# Patient Record
Sex: Male | Born: 1950
Health system: Southern US, Community
[De-identification: ages and names within clinical notes are randomized; demographics above are authoritative.]

## PROBLEM LIST (undated history)

## (undated) DIAGNOSIS — I480 Paroxysmal atrial fibrillation: Secondary | ICD-10-CM

## (undated) DIAGNOSIS — B159 Hepatitis A without hepatic coma: Secondary | ICD-10-CM

## (undated) DIAGNOSIS — K219 Gastro-esophageal reflux disease without esophagitis: Secondary | ICD-10-CM

## (undated) DIAGNOSIS — T148XXA Other injury of unspecified body region, initial encounter: Secondary | ICD-10-CM

## (undated) DIAGNOSIS — K648 Other hemorrhoids: Secondary | ICD-10-CM

## (undated) DIAGNOSIS — K2970 Gastritis, unspecified, without bleeding: Secondary | ICD-10-CM

## (undated) HISTORY — DX: Gastritis, unspecified, without bleeding: K29.70

## (undated) HISTORY — DX: Gastro-esophageal reflux disease without esophagitis: K21.9

## (undated) HISTORY — DX: Paroxysmal atrial fibrillation: I48.0

## (undated) HISTORY — DX: Other injury of unspecified body region, initial encounter: T14.8XXA

## (undated) HISTORY — DX: Other hemorrhoids: K64.8

## (undated) HISTORY — PX: HERNIA REPAIR: SHX51

## (undated) HISTORY — PX: UPPER GASTROINTESTINAL ENDOSCOPY: SHX188

## (undated) HISTORY — DX: Hepatitis a without hepatic coma: B15.9

---

## 1970-10-06 DIAGNOSIS — B159 Hepatitis A without hepatic coma: Secondary | ICD-10-CM

## 1970-10-06 HISTORY — DX: Hepatitis a without hepatic coma: B15.9

## 2001-12-17 ENCOUNTER — Encounter: Admission: RE | Admit: 2001-12-17 | Discharge: 2001-12-17 | Payer: Self-pay | Admitting: Internal Medicine

## 2001-12-17 ENCOUNTER — Encounter: Payer: Self-pay | Admitting: Internal Medicine

## 2002-05-21 ENCOUNTER — Emergency Department (HOSPITAL_COMMUNITY): Admission: EM | Admit: 2002-05-21 | Discharge: 2002-05-21 | Payer: Self-pay | Admitting: Emergency Medicine

## 2002-05-21 ENCOUNTER — Encounter: Payer: Self-pay | Admitting: Emergency Medicine

## 2002-05-23 ENCOUNTER — Encounter: Payer: Self-pay | Admitting: Internal Medicine

## 2002-05-23 ENCOUNTER — Ambulatory Visit (HOSPITAL_COMMUNITY): Admission: RE | Admit: 2002-05-23 | Discharge: 2002-05-23 | Payer: Self-pay | Admitting: Internal Medicine

## 2002-05-30 ENCOUNTER — Ambulatory Visit (HOSPITAL_COMMUNITY): Admission: RE | Admit: 2002-05-30 | Discharge: 2002-05-30 | Payer: Self-pay | Admitting: Internal Medicine

## 2002-05-30 ENCOUNTER — Encounter: Payer: Self-pay | Admitting: Internal Medicine

## 2002-06-07 ENCOUNTER — Ambulatory Visit (HOSPITAL_COMMUNITY): Admission: RE | Admit: 2002-06-07 | Discharge: 2002-06-07 | Payer: Self-pay | Admitting: Internal Medicine

## 2002-06-07 ENCOUNTER — Encounter: Payer: Self-pay | Admitting: Internal Medicine

## 2004-11-19 ENCOUNTER — Ambulatory Visit: Payer: Self-pay | Admitting: Gastroenterology

## 2005-01-30 ENCOUNTER — Ambulatory Visit: Payer: Self-pay | Admitting: Internal Medicine

## 2005-09-17 ENCOUNTER — Ambulatory Visit: Payer: Self-pay | Admitting: Internal Medicine

## 2005-09-23 ENCOUNTER — Encounter (INDEPENDENT_AMBULATORY_CARE_PROVIDER_SITE_OTHER): Payer: Self-pay | Admitting: *Deleted

## 2005-09-23 LAB — CONVERTED CEMR LAB

## 2005-10-17 ENCOUNTER — Ambulatory Visit: Payer: Self-pay | Admitting: Internal Medicine

## 2005-10-17 ENCOUNTER — Ambulatory Visit: Payer: Self-pay | Admitting: Gastroenterology

## 2005-12-10 ENCOUNTER — Ambulatory Visit: Payer: Self-pay | Admitting: Internal Medicine

## 2005-12-11 ENCOUNTER — Ambulatory Visit: Payer: Self-pay | Admitting: Internal Medicine

## 2007-01-29 ENCOUNTER — Ambulatory Visit: Payer: Self-pay | Admitting: Family Medicine

## 2007-01-29 ENCOUNTER — Encounter: Payer: Self-pay | Admitting: Internal Medicine

## 2007-04-13 ENCOUNTER — Ambulatory Visit: Payer: Self-pay | Admitting: Internal Medicine

## 2007-04-13 DIAGNOSIS — K219 Gastro-esophageal reflux disease without esophagitis: Secondary | ICD-10-CM | POA: Insufficient documentation

## 2007-04-20 ENCOUNTER — Telehealth: Payer: Self-pay | Admitting: Internal Medicine

## 2007-04-20 ENCOUNTER — Encounter (INDEPENDENT_AMBULATORY_CARE_PROVIDER_SITE_OTHER): Payer: Self-pay | Admitting: *Deleted

## 2007-04-27 ENCOUNTER — Telehealth (INDEPENDENT_AMBULATORY_CARE_PROVIDER_SITE_OTHER): Payer: Self-pay | Admitting: *Deleted

## 2007-04-30 ENCOUNTER — Encounter (INDEPENDENT_AMBULATORY_CARE_PROVIDER_SITE_OTHER): Payer: Self-pay | Admitting: *Deleted

## 2007-07-22 ENCOUNTER — Ambulatory Visit: Payer: Self-pay | Admitting: Gastroenterology

## 2007-09-07 ENCOUNTER — Telehealth (INDEPENDENT_AMBULATORY_CARE_PROVIDER_SITE_OTHER): Payer: Self-pay | Admitting: *Deleted

## 2007-09-13 ENCOUNTER — Encounter: Payer: Self-pay | Admitting: Gastroenterology

## 2007-09-13 ENCOUNTER — Encounter: Payer: Self-pay | Admitting: Internal Medicine

## 2007-09-13 ENCOUNTER — Ambulatory Visit: Payer: Self-pay | Admitting: Gastroenterology

## 2007-09-13 DIAGNOSIS — K2970 Gastritis, unspecified, without bleeding: Secondary | ICD-10-CM

## 2007-09-13 DIAGNOSIS — K648 Other hemorrhoids: Secondary | ICD-10-CM

## 2007-09-13 HISTORY — DX: Other hemorrhoids: K64.8

## 2007-09-13 HISTORY — DX: Gastritis, unspecified, without bleeding: K29.70

## 2007-09-14 ENCOUNTER — Encounter (INDEPENDENT_AMBULATORY_CARE_PROVIDER_SITE_OTHER): Payer: Self-pay | Admitting: *Deleted

## 2007-09-14 DIAGNOSIS — M8580 Other specified disorders of bone density and structure, unspecified site: Secondary | ICD-10-CM

## 2007-10-08 ENCOUNTER — Telehealth (INDEPENDENT_AMBULATORY_CARE_PROVIDER_SITE_OTHER): Payer: Self-pay | Admitting: *Deleted

## 2007-12-08 DIAGNOSIS — R1310 Dysphagia, unspecified: Secondary | ICD-10-CM | POA: Insufficient documentation

## 2008-03-22 ENCOUNTER — Ambulatory Visit: Payer: Self-pay | Admitting: Internal Medicine

## 2008-03-22 ENCOUNTER — Telehealth (INDEPENDENT_AMBULATORY_CARE_PROVIDER_SITE_OTHER): Payer: Self-pay | Admitting: *Deleted

## 2008-04-26 ENCOUNTER — Encounter: Payer: Self-pay | Admitting: Internal Medicine

## 2008-05-18 ENCOUNTER — Ambulatory Visit (HOSPITAL_BASED_OUTPATIENT_CLINIC_OR_DEPARTMENT_OTHER): Admission: RE | Admit: 2008-05-18 | Discharge: 2008-05-18 | Payer: Self-pay | Admitting: Surgery

## 2008-06-05 ENCOUNTER — Encounter: Payer: Self-pay | Admitting: Internal Medicine

## 2008-06-16 ENCOUNTER — Encounter (INDEPENDENT_AMBULATORY_CARE_PROVIDER_SITE_OTHER): Payer: Self-pay | Admitting: *Deleted

## 2008-07-13 ENCOUNTER — Encounter: Payer: Self-pay | Admitting: Internal Medicine

## 2008-07-24 ENCOUNTER — Encounter: Payer: Self-pay | Admitting: Internal Medicine

## 2008-08-07 ENCOUNTER — Encounter: Payer: Self-pay | Admitting: Internal Medicine

## 2008-08-08 ENCOUNTER — Ambulatory Visit: Payer: Self-pay | Admitting: Internal Medicine

## 2008-08-14 ENCOUNTER — Encounter (INDEPENDENT_AMBULATORY_CARE_PROVIDER_SITE_OTHER): Payer: Self-pay | Admitting: *Deleted

## 2008-08-22 ENCOUNTER — Encounter (INDEPENDENT_AMBULATORY_CARE_PROVIDER_SITE_OTHER): Payer: Self-pay | Admitting: *Deleted

## 2008-08-22 LAB — CONVERTED CEMR LAB
ALT: 20 units/L (ref 0–53)
AST: 23 units/L (ref 0–37)
Albumin: 3.9 g/dL (ref 3.5–5.2)
Alkaline Phosphatase: 52 units/L (ref 39–117)
BUN: 12 mg/dL (ref 6–23)
Basophils Absolute: 0 10*3/uL (ref 0.0–0.1)
Basophils Relative: 0.2 % (ref 0.0–3.0)
Bilirubin, Direct: 0.2 mg/dL (ref 0.0–0.3)
CO2: 29 meq/L (ref 19–32)
Calcium: 8.6 mg/dL (ref 8.4–10.5)
Chloride: 105 meq/L (ref 96–112)
Cholesterol: 143 mg/dL (ref 0–200)
Creatinine, Ser: 0.8 mg/dL (ref 0.4–1.5)
Eosinophils Absolute: 0.2 10*3/uL (ref 0.0–0.7)
Eosinophils Relative: 4.2 % (ref 0.0–5.0)
GFR calc Af Amer: 128 mL/min
GFR calc non Af Amer: 106 mL/min
Glucose, Bld: 85 mg/dL (ref 70–99)
HCT: 43.2 % (ref 39.0–52.0)
HDL: 48.8 mg/dL (ref >39.0)
Hemoglobin: 15.4 g/dL (ref 13.0–17.0)
LDL Cholesterol: 84 mg/dL (ref 0–99)
Lymphocytes Relative: 19.6 % (ref 12.0–46.0)
MCHC: 35.8 g/dL (ref 30.0–36.0)
MCV: 96.3 fL (ref 78.0–100.0)
Monocytes Absolute: 0.5 10*3/uL (ref 0.1–1.0)
Monocytes Relative: 8.3 % (ref 3.0–12.0)
Neutro Abs: 4 10*3/uL (ref 1.4–7.7)
Neutrophils Relative %: 67.7 % (ref 43.0–77.0)
PSA: 0.21 ng/mL (ref 0.10–4.00)
Platelets: 180 10*3/uL (ref 150–400)
Potassium: 4.2 meq/L (ref 3.5–5.1)
RBC: 4.48 M/uL (ref 4.22–5.81)
RDW: 12.5 % (ref 11.5–14.6)
Sodium: 139 meq/L (ref 135–145)
TSH: 1.55 microintl units/mL (ref 0.35–5.50)
Total Bilirubin: 0.8 mg/dL (ref 0.3–1.2)
Total CHOL/HDL Ratio: 2.9
Total Protein: 6.6 g/dL (ref 6.0–8.3)
Triglycerides: 50 mg/dL (ref 0–149)
VLDL: 10 mg/dL (ref 0–40)
WBC: 5.8 10*3/uL (ref 4.5–10.5)

## 2008-09-13 ENCOUNTER — Telehealth (INDEPENDENT_AMBULATORY_CARE_PROVIDER_SITE_OTHER): Payer: Self-pay | Admitting: *Deleted

## 2008-09-14 ENCOUNTER — Telehealth: Payer: Self-pay | Admitting: Internal Medicine

## 2008-10-05 ENCOUNTER — Telehealth: Payer: Self-pay | Admitting: Internal Medicine

## 2008-10-12 ENCOUNTER — Ambulatory Visit: Payer: Self-pay | Admitting: Internal Medicine

## 2008-10-24 ENCOUNTER — Encounter (INDEPENDENT_AMBULATORY_CARE_PROVIDER_SITE_OTHER): Payer: Self-pay | Admitting: *Deleted

## 2008-10-24 ENCOUNTER — Telehealth (INDEPENDENT_AMBULATORY_CARE_PROVIDER_SITE_OTHER): Payer: Self-pay | Admitting: *Deleted

## 2008-12-18 ENCOUNTER — Telehealth (INDEPENDENT_AMBULATORY_CARE_PROVIDER_SITE_OTHER): Payer: Self-pay | Admitting: *Deleted

## 2009-01-02 ENCOUNTER — Ambulatory Visit: Payer: Self-pay | Admitting: Family Medicine

## 2009-05-15 ENCOUNTER — Ambulatory Visit: Payer: Self-pay | Admitting: Internal Medicine

## 2009-05-15 DIAGNOSIS — M2550 Pain in unspecified joint: Secondary | ICD-10-CM

## 2009-05-15 DIAGNOSIS — IMO0001 Reserved for inherently not codable concepts without codable children: Secondary | ICD-10-CM | POA: Insufficient documentation

## 2009-05-16 ENCOUNTER — Ambulatory Visit: Payer: Self-pay | Admitting: Internal Medicine

## 2009-05-16 LAB — CONVERTED CEMR LAB
Basophils Absolute: 0 10*3/uL (ref 0.0–0.1)
Bilirubin Urine: NEGATIVE
HCT: 48.3 % (ref 39.0–52.0)
Hemoglobin, Urine: NEGATIVE
Ketones, ur: NEGATIVE mg/dL
Leukocytes, UA: NEGATIVE
Lymphs Abs: 0.6 10*3/uL — ABNORMAL LOW (ref 0.7–4.0)
MCHC: 34.1 g/dL (ref 30.0–36.0)
MCV: 97 fL (ref 78.0–100.0)
Monocytes Absolute: 0.7 10*3/uL (ref 0.1–1.0)
Neutro Abs: 4.7 10*3/uL (ref 1.4–7.7)
Nitrite: NEGATIVE
Platelets: 155 10*3/uL (ref 150.0–400.0)
RDW: 12.2 % (ref 11.5–14.6)
Sed Rate: 8 mm/hr (ref 0–22)

## 2009-05-17 ENCOUNTER — Encounter (INDEPENDENT_AMBULATORY_CARE_PROVIDER_SITE_OTHER): Payer: Self-pay | Admitting: *Deleted

## 2009-05-18 ENCOUNTER — Telehealth: Payer: Self-pay | Admitting: Internal Medicine

## 2009-05-21 ENCOUNTER — Encounter: Payer: Self-pay | Admitting: Internal Medicine

## 2009-05-25 ENCOUNTER — Telehealth (INDEPENDENT_AMBULATORY_CARE_PROVIDER_SITE_OTHER): Payer: Self-pay | Admitting: *Deleted

## 2009-06-06 ENCOUNTER — Telehealth (INDEPENDENT_AMBULATORY_CARE_PROVIDER_SITE_OTHER): Payer: Self-pay | Admitting: *Deleted

## 2009-07-06 ENCOUNTER — Ambulatory Visit: Payer: Self-pay | Admitting: Internal Medicine

## 2009-07-06 DIAGNOSIS — M279 Disease of jaws, unspecified: Secondary | ICD-10-CM | POA: Insufficient documentation

## 2009-07-18 ENCOUNTER — Encounter: Payer: Self-pay | Admitting: Internal Medicine

## 2009-10-06 HISTORY — PX: COLONOSCOPY: SHX174

## 2009-10-06 HISTORY — PX: SKIN CANCER EXCISION: SHX779

## 2010-01-31 ENCOUNTER — Encounter: Payer: Self-pay | Admitting: Internal Medicine

## 2010-06-07 ENCOUNTER — Ambulatory Visit: Payer: Self-pay | Admitting: Internal Medicine

## 2010-06-14 ENCOUNTER — Ambulatory Visit: Payer: Self-pay | Admitting: Internal Medicine

## 2010-08-27 ENCOUNTER — Telehealth: Payer: Self-pay | Admitting: Internal Medicine

## 2010-08-28 ENCOUNTER — Encounter (INDEPENDENT_AMBULATORY_CARE_PROVIDER_SITE_OTHER): Payer: Self-pay | Admitting: *Deleted

## 2010-09-03 ENCOUNTER — Ambulatory Visit: Payer: Self-pay | Admitting: Gastroenterology

## 2010-09-03 DIAGNOSIS — K602 Anal fissure, unspecified: Secondary | ICD-10-CM

## 2010-10-22 ENCOUNTER — Ambulatory Visit: Admit: 2010-10-22 | Payer: Self-pay | Admitting: Gastroenterology

## 2010-11-03 LAB — CONVERTED CEMR LAB
ALT: 22 units/L (ref 0–53)
ALT: 24 units/L (ref 0–53)
AST: 27 units/L (ref 0–37)
AST: 28 units/L (ref 0–37)
Albumin: 3.9 g/dL (ref 3.5–5.2)
Alkaline Phosphatase: 47 units/L (ref 39–117)
Alkaline Phosphatase: 52 units/L (ref 39–117)
BUN: 13 mg/dL (ref 6–23)
BUN: 16 mg/dL (ref 6–23)
Basophils Absolute: 0 10*3/uL (ref 0.0–0.1)
Basophils Relative: 0.3 % (ref 0.0–1.0)
Basophils Relative: 0.4 % (ref 0.0–3.0)
Bilirubin, Direct: 0.1 mg/dL (ref 0.0–0.3)
Bilirubin, Direct: 0.2 mg/dL (ref 0.0–0.3)
CO2: 30 meq/L (ref 19–32)
Calcium: 9.1 mg/dL (ref 8.4–10.5)
Chloride: 107 meq/L (ref 96–112)
Chloride: 110 meq/L (ref 96–112)
Cholesterol: 134 mg/dL (ref 0–200)
Cholesterol: 142 mg/dL (ref 0–200)
Creatinine, Ser: 0.9 mg/dL (ref 0.4–1.5)
Creatinine, Ser: 0.9 mg/dL (ref 0.4–1.5)
Eosinophils Absolute: 0.3 10*3/uL (ref 0.0–0.6)
Eosinophils Relative: 5.6 % — ABNORMAL HIGH (ref 0.0–5.0)
Eosinophils Relative: 7.3 % — ABNORMAL HIGH (ref 0.0–5.0)
GFR calc Af Amer: 112 mL/min
GFR calc non Af Amer: 89.09 mL/min (ref >60)
GFR calc non Af Amer: 92 mL/min
Glucose, Bld: 75 mg/dL (ref 70–99)
Glucose, Bld: 87 mg/dL (ref 70–99)
HCT: 44.4 % (ref 39.0–52.0)
HDL: 55.8 mg/dL (ref >39.0)
Hemoglobin: 15.3 g/dL (ref 13.0–17.0)
LDL Cholesterol: 79 mg/dL (ref 0–99)
LDL Cholesterol: 79 mg/dL (ref 0–99)
Lymphocytes Relative: 21.4 % (ref 12.0–46.0)
MCHC: 34.5 g/dL (ref 30.0–36.0)
MCV: 97.2 fL (ref 78.0–100.0)
MCV: 98.2 fL (ref 78.0–100.0)
Monocytes Absolute: 0.5 10*3/uL (ref 0.1–1.0)
Monocytes Absolute: 0.6 10*3/uL (ref 0.2–0.7)
Monocytes Relative: 10 % (ref 3.0–12.0)
Monocytes Relative: 10.3 % (ref 3.0–11.0)
Neutro Abs: 3.9 10*3/uL (ref 1.4–7.7)
Neutrophils Relative %: 62.4 % (ref 43.0–77.0)
Neutrophils Relative %: 64 % (ref 43.0–77.0)
PSA: 0.23 ng/mL (ref 0.10–4.00)
Platelets: 174 10*3/uL (ref 150.0–400.0)
Platelets: 199 10*3/uL (ref 150–400)
Potassium: 4.5 meq/L (ref 3.5–5.1)
RBC: 4.43 M/uL (ref 4.22–5.81)
RBC: 4.56 M/uL (ref 4.22–5.81)
RDW: 12.2 % (ref 11.5–14.6)
Sodium: 144 meq/L (ref 135–145)
TSH: 1.27 microintl units/mL (ref 0.35–5.50)
TSH: 1.96 microintl units/mL (ref 0.35–5.50)
Total Bilirubin: 0.8 mg/dL (ref 0.3–1.2)
Total Bilirubin: 1.2 mg/dL (ref 0.3–1.2)
Total CHOL/HDL Ratio: 2.5
Total Protein: 5.9 g/dL — ABNORMAL LOW (ref 6.0–8.3)
Total Protein: 6.5 g/dL (ref 6.0–8.3)
Triglycerides: 36 mg/dL (ref 0–149)
VLDL: 7 mg/dL (ref 0–40)
Vit D, 25-Hydroxy: 62 ng/mL (ref 30–89)
WBC: 5.3 10*3/uL (ref 4.5–10.5)
WBC: 6.1 10*3/uL (ref 4.5–10.5)

## 2010-11-05 NOTE — Miscellaneous (Signed)
Summary: BONE DENSITY  Clinical Lists Changes  Orders: Added new Test order of T-Bone Densitometry (77080) - Signed Added new Test order of T-Lumbar Vertebral Assessment (77082) - Signed 

## 2010-11-05 NOTE — Assessment & Plan Note (Signed)
Summary: CPX/REFERRAL FOR HIS HEMR/PH   Vital Signs:  Patient profile:   60 year old male Height:      72 inches Weight:      164.4 pounds BMI:     22.38 Pulse rate:   76 / minute Resp:     12 per minute BP sitting:   110 / 68   History of Present Illness: Michael Lindsey is here for a physical; he is asymptomatic .   Allergies: No Known Drug Allergies  Past History:  Past Medical History: Hepatitis A in [facility] @ [place] 1972 GERD Osteoporosis: T score -2.9 @ spine in 2007;T score 1 1.8 in 2008.Bisphosphonate therapy initiated 2007 but interupted 07/2009  to  present due to oral surgery & CTX score by Dr Bradly Chris  Past Surgical History: PTX,fractured  ribs(2nd,3rd & 4th) 2003;fractured  LUE  age 52;Colonoscopy negative X2; Upper Endoscopy 2003 :esophagitis Hepatitis A in college Inguinal herniorrhaphy 2009,Dr Wenda Low  Family History: Father: CAD, colon cancer, emphysema Mother:Mental Health issue, ?Tb, S/P iatrogenic  PTX Siblings: bro :depression; P uncle: alcoholism  Social History: Occupation:  Secondary school teacher Married Alcohol use-yes: socially Regular exercise-yes: 60 min daily as swimming , biking, walking, weights  Review of Systems  The patient denies anorexia, fever, weight loss, weight gain, vision loss, decreased hearing, hoarseness, chest pain, syncope, dyspnea on exertion, peripheral edema, prolonged cough, headaches, hemoptysis, abdominal pain, melena, hematochezia, severe indigestion/heartburn, hematuria, suspicious skin lesions, depression, unusual weight change, abnormal bleeding, enlarged lymph nodes, and angioedema.         Recent fungal dermatitis treated with Diflucan & topical agent.  Physical Exam  General:  Thin but well-nourished; alert,appropriate and cooperative throughout examination Head:  Normocephalic and atraumatic without obvious abnormalities.  Eyes:  No corneal or conjunctival inflammation noted. Perrla. Funduscopic exam  benign, without hemorrhages, exudates or papilledema. Ears:  External ear exam shows no significant lesions or deformities.  Otoscopic examination reveals clear canals, tympanic membranes are intact bilaterally without bulging, retraction, inflammation or discharge. Hearing is grossly normal bilaterally. Nose:  External nasal examination shows no deformity or inflammation. Nasal mucosa are pink and moist without lesions or exudates. Mouth:  Oral mucosa and oropharynx without lesions or exudates.  Teeth in good repair. Neck:  No deformities, masses, or tenderness noted. Lungs:  Normal respiratory effort, chest expands symmetrically. Lungs are clear to auscultation, no crackles or wheezes. Heart:  regular rhythm, no murmur, no gallop, no rub, no JVD, no HJR, and bradycardia.   Abdomen:  Bowel sounds positive,abdomen soft and non-tender without masses, organomegaly or hernias noted. Rectal:  No external abnormalities noted. Normal sphincter tone. No rectal masses or tenderness. Genitalia:  Testes bilaterally descended without nodularity, tenderness or masses. No scrotal masses or lesions. No penis lesions or urethral discharge. L varicocele.   Weakness L inguinal area w/o true hernia Prostate:  Prostate gland firm and smooth, no enlargement, nodularity, tenderness, mass, asymmetry or induration. Msk:  No deformity or scoliosis noted of thoracic or lumbar spine.   Pulses:  R and L carotid,radial,dorsalis pedis and posterior tibial pulses are full and equal bilaterally Extremities:  No clubbing, cyanosis, edema, or deformity noted with normal full range of motion of all joints.   Neurologic:  alert & oriented X3 and DTRs symmetrical and normal.   Skin:  Intact without suspicious lesions or rashes Cervical Nodes:  No lymphadenopathy noted Axillary Nodes:  No palpable lymphadenopathy Inguinal Nodes:  No significant adenopathy Psych:  memory intact for recent and remote,  normally interactive, and good eye  contact.     Impression & Recommendations:  Problem # 1:  ROUTINE GENERAL MEDICAL EXAM@HEALTH  CARE FACL (ICD-V70.0)  Orders: EKG w/ Interpretation (93000) Radiology Referral (Radiology)  Problem # 2:  OSTEOPOROSIS (ICD-733.00) improved to Osteopenia  based on 2008 BMD His updated medication list for this problem includes:    Boniva 150 Mg Tabs (Ibandronate sodium) .Marland Kitchen... 1 every month  Problem # 3:  GERD (ICD-530.81) Quiescent/ controlled His updated medication list for this problem includes:    Zantac 150 Mg Tabs (Ranitidine hcl) .Marland Kitchen... 1 by mouth once daily as needed   Complete Medication List: 1)  Multivitamin  2)  Flax Seed Oil  3)  Caltrate With Vit D  4)  2 Tbsp Apple Cider Vinegar  5)  2 Tbsp Honey  6)  Boniva 150 Mg Tabs (Ibandronate sodium) .Marland Kitchen.. 1 every month 7)  Zantac 150 Mg Tabs (Ranitidine hcl) .Marland Kitchen.. 1 by mouth once daily 8)  Vitamin D 1000 Unit Tabs (Cholecalciferol) .Marland Kitchen.. 1 by mouth once daily 9)  Echinacea 400 Mg Caps (Echinacea) .... As needed cold symptoms 10)  Tramadol Hcl 50 Mg Tabs (Tramadol hcl) .Marland Kitchen.. 1 q 6 hrs as needed for pain  Patient Instructions: 1)  Please schedule fasting labs same day as BMD @ [facility]:CTX; vitamin D level;BMP;Hepatic Panel ;Lipid Panel ;TSH ;CBC w/ Diff;PSA .Codes: V70.0, 733.90, 530.81. 2)  Avoid foods high in acid (tomatoes, citrus juices, spicy foods). Avoid eating within two hours of lying down or before exercising. Do not over eat; try smaller more frequent meals. Elevate head of bed twelve inches when sleeping.

## 2010-11-05 NOTE — Assessment & Plan Note (Signed)
Summary: hemorroids w/other complications--ch.    History of Present Illness Visit Type: 2 Primary GI MD: Sheryn Bison MD FACP FAGA Primary [place]: Marga Melnick, MD Requesting Servando Kyllonen: Marga Melnick, MD Chief Complaint: First week of October pt started having rectal pain and swollen hemorrhoids. Pt states it all started with a bike ride. Pt has been using preperation H with some improvement.  History of Present Illness:   60 year old Caucasian male with 2 months of rectal discomfort worsened with BM.He also has had very rectal rash which has responded to Dedonide ointment after dermatology referral. He denies rectal bleeding, abdominal pain, or upper GI symptoms.  His family history is positive for colon cancer in his father. He is due for followup exam in December 2013. His chronic GERD is made with a home remedy of honey and vinerar.He denies dysphagia, anorexia, weight loss, or NSAID use. He does have chronic osteoporosis but previous celiac serologies were negative.   GI Review of Systems      Denies abdominal pain, acid reflux, belching, bloating, chest pain, dysphagia with liquids, dysphagia with solids, heartburn, loss of appetite, nausea, vomiting, vomiting blood, weight loss, and  weight gain.      Reports hemorrhoids and  rectal pain.     Denies anal fissure, black tarry stools, change in bowel habit, constipation, diarrhea, diverticulosis, fecal incontinence, heme positive stool, irritable bowel syndrome, jaundice, light color stool, liver problems, and  rectal bleeding.    Current Medications (verified): 1)  Multivitamin 2)  Flax Seed Oil 3)  Caltrate With Vit D 4)  2 Tbsp Apple Cider Vinegar 5)  2 Tbsp Honey 6)  Zantac 150 Mg Tabs (Ranitidine Hcl) .Marland Kitchen.. 1 By Mouth Once A Week 7)  Vitamin D 1000 Unit Tabs (Cholecalciferol) .Marland Kitchen.. 1 By Mouth Once Daily 8)  Echinacea 400 Mg Caps (Echinacea) .... As Needed Cold Symptoms 9)  Preparation H 0.25-3-14-71.9 % Oint  (Phenyleph-Shark Liv Oil-Mo-Pet) .... Use As Needed  Allergies (verified): No Known Drug Allergies  Past History:  Past medical, surgical, family and social histories (including risk factors) reviewed for relevance to current acute and chronic problems.  Past Medical History: Reviewed history from 06/07/2010 and no changes required. [facility]atitis A in [facility] @ [facility] 1972 GERD Osteoporosis: T score -2.9 @ spine in 2007;T score 1 1.8 in 2008.Bisphosphonate therapy initiated 2007 but interupted 07/2009  to  present due to oral surgery & CTX score by Dr Bradly Chris  Past Surgical History: PTX,fractured  ribs(2nd,3rd & 4th) 2003;fractured  LUE  age 38;Colonoscopy negative X2; Upper Endoscopy 2003 :esophagitis Hepatitis A in college Inguinal herniorrhaphy 2009,Dr Wenda Low Tooth extraction 11/2009  Family History: Reviewed history from 06/07/2010 and no changes required. Father: CAD, colon cancer, emphysema Mother:Mental Health issue, ?Tb, S/P iatrogenic  PTX Siblings: bro :depression; P uncle: alcoholism  Social History: Reviewed history from 06/07/2010 and no changes required. Occupation:  Secondary school teacher Married Alcohol use-yes: socially Regular exercise-yes: 60 min daily as swimming , biking, walking, weights  Review of Systems       The patient complains of itching and skin rash.  The patient denies allergy/sinus, anemia, anxiety-new, arthritis/joint pain, back pain, blood in urine, breast changes/lumps, change in vision, confusion, cough, coughing up blood, depression-new, fainting, fatigue, fever, headaches-new, hearing problems, heart murmur, heart rhythm changes, menstrual pain, muscle pains/cramps, night sweats, nosebleeds, pregnancy symptoms, shortness of breath, sleeping problems, sore throat, swelling of feet/legs, swollen lymph glands, thirst - excessive , urination - excessive , urination changes/pain, urine leakage, vision  changes, and voice change.    Vital  Signs:  Patient profile:   60 year old male Height:      72 inches Weight:      170.13 pounds BMI:     23.16 Pulse rate:   80 / minute Pulse rhythm:   regular BP sitting:   128 / 72  (left arm) Cuff size:   regular  Vitals Entered By: Christie Nottingham CMA Duncan Dull) (September 03, 2010 1:38 PM)  Physical Exam  General:  Well developed, well nourished, no acute distress.healthy appearing.   Head:  Normocephalic and atraumatic. Eyes:  PERRLA, no icterus.exam deferred to patient's ophthalmologist.   Abdomen:  Soft, nontender and nondistended. No masses, hepatosplenomegaly or hernias noted. Normal bowel sounds. Rectal:  Normal exam.Superficial posterior fissure noted. Rectal exam otherwise is unremarkable with guaiac-negative stool. Psych:  Alert and cooperative. Normal mood and affect.   Impression & Recommendations:  Problem # 1:  GERD (ICD-530.81) Assessment Comment Only  Problem # 2:  NEOPLASM, MALIGNANT, COLON, FAMILY HX, FATHER (ICD-V16.0) Assessment: Unchanged Colonoscopy followup as per clinical protocol.  Problem # 3:  ANAL FISSURE (ICD-565.0) Assessment: Deteriorated Diltiazem gel locally several times a day with p.r.n. Xylocaine ointment. I fiber diet and daily Benefiber suggested. If he does not improve in 7-10 days we will consider flexible sigmoidoscopy exam and anoscopy with sedation.  Patient Instructions: 1)  Copy sent to : Marga Melnick, MD 2)  Your prescriptions have been [facility] to [facility].  3)  Use Benefiber daily 4)  The medication list was reviewed and reconciled.  All changed / newly prescribed medications were explained.  A complete medication list was provided to the patient / caregiver. 5)  High Fiber, Low Fat  Healthy Eating Plan brochure given.  Prescriptions: XYLOCAINE JELLY 2 % GEL (LIDOCAINE HCL) use per rectum as needed  #1 tube x 1   Entered by:   Harlow Mares CMA (AAMA)   Authorized by:   Mardella Layman MD [facility]   Signed by:   Harlow Mares CMA (AAMA) on 09/03/2010   Method used:   Faxed to ...       [facility]* (retail)       [address]       [place], [place]  [id number]       Ph: [phone]       Fax: [phone]   RxID:   [id number] DILTIAZEM GEL 2% apply small amount to rectum Four times a day  #15 grams x 1   Entered by:   Harlow Mares CMA (AAMA)   Authorized by:   Mardella Layman MD [facility]   Signed by:   Harlow Mares CMA (AAMA) on 09/03/2010   Method used:   Faxed to ...       [facility]* (retail)       [address]       [place], [place]  [id number]       Ph: [phone]       Fax: [phone]   RxID:   [id number]

## 2010-11-05 NOTE — Miscellaneous (Signed)
Summary: Health Care Power of [facility] Power of Attorney   Imported By: Lanelle Bal 03/29/2010 14:11:19  _____________________________________________________________________  External Attachment:    Type:   Image     Comment:   External Document

## 2010-11-05 NOTE — Progress Notes (Signed)
Summary: Referral  Phone Note Call from Patient Call back at Work Phone [phone]   Summary of Call: Patient called for referral due to hemorrhoids. Please advise. Initial call taken by: Lucious Groves CMA,  August 27, 2010 3:40 PM  Follow-up for Phone Call        Per MD will refer to GI. Lucious Groves CMA  August 27, 2010 5:03 PM   Left message on voicemail to call back to office. Lucious Groves CMA  August 28, 2010 8:13 AM   Patient notified. Lucious Groves CMA  August 28, 2010 8:46 AM

## 2010-11-05 NOTE — Letter (Signed)
Summary: New Patient letter  [facility]  [address] [place], [place] [address]   Phone: [phone]  Fax: [phone]       08/28/2010 MRN: [id number]  ERNAN Lindsey [address] [place], [place]  [address]  Dear Michael Lindsey,  Welcome to the Gastroenterology Division at [facility].    You are scheduled to see Dr.  Jarold Motto on 10-10-10 at 8:30a.m. on the 3rd floor at [facility], [address]  We ask that you try to arrive at our office 15 minutes prior to your appointment time to allow for check-in.  We would like you to complete the enclosed self-administered evaluation form prior to your visit and bring it with you on the day of your appointment.  We will review it with you.  Also, please bring a complete list of all your medications or, if you prefer, bring the medication bottles and we will list them.  Please bring your insurance card so that we may make a copy of it.  If your insurance requires a referral to see a specialist, please bring your referral form from your primary care physician.  Co-payments are due at the time of your visit and may be paid by cash, check or credit card.     Your office visit will consist of a consult with your physician (includes a physical exam), any laboratory testing he/she may order, scheduling of any necessary diagnostic testing (e.g. x-ray, ultrasound, CT-scan), and scheduling of a procedure (e.g. Endoscopy, Colonoscopy) if required.  Please allow enough time on your schedule to allow for any/all of these possibilities.    If you cannot keep your appointment, please call [phone] to cancel or reschedule prior to your appointment date.  This allows [place] the opportunity to schedule an appointment for another patient in need of care.  If you do not cancel or reschedule by 5 p.m. the business day prior to your appointment date, you will be charged a $50.00 late cancellation/no-show fee.    Thank you for  choosing [facility] for your medical needs.  We appreciate the opportunity to care for you.  Please visit [place] at our website  to learn more about our practice.                     Sincerely,                                                             The Gastroenterology Division

## 2010-11-05 NOTE — Miscellaneous (Signed)
Summary: Living Will  Living Will   Imported By: Lanelle Bal 03/29/2010 14:09:27  _____________________________________________________________________  External Attachment:    Type:   Image     Comment:   External Document

## 2011-02-18 NOTE — Assessment & Plan Note (Signed)
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   The patient does have documented acid reflux and hiatal hernia per  endoscopic exam done in July 2003.  He developed osteoporosis which was  felt secondary to chronic PPI therapy and has self discontinued this  medication and uses a homemade mixture of some type of vinegar solution  for his acid reflux symptoms.  He recently has had intermittent solid  food dysphagia.  He has seen Dr. Alwyn Lindsey, who has recommended endoscopy.  He denies typical acid reflux symptoms at this time or any hepatobiliary  complaints.  He also has fairly regular bowel movements, but he does  have a past history of colon polyps removed by colonoscopy.  This was  last performed approximately 5 years ago.  He denies lower GI problems  at this time.   RECENT LABORATORY DATA:  Per Dr. Alwyn Lindsey, showed a normal CBC, metabolic  profile, and liver function tests.   Michael Lindsey uses Librax on a p.r.n. basis for his bowel complaints along with  a variety of multivitamins, Caltrate with vitamin D, and a mixture of an  apple cider vinegar and honey preparation for his GERD.  He has had no  other symptoms of malabsorption and denies skin rashes, joint pains,  oral stomatitis, etc.  His family is of [place] and [place]  extraction.  There is no known family history of celiac disease or other  gastrointestinal problems, although his father died of colon cancer at  the age of 81.   PHYSICAL EXAMINATION:  GENERAL:  He is a healthy-appearing white male in  no distress.  He appears his stated age.  VITAL SIGNS:  He weighs 164 pounds.  Blood pressure is  108/68, pulse was  72 and regular.  A general physical exam was not performed at this time.   ASSESSMENT:  1. Probable continued gastroesophageal reflux disease.  Rule out      peptic stricture of the esophagus versus eosinophilic esophagitis.  2. History of recurrent colon polyps and a family history of colon      cancer.  The patient is due now for followup colon examination.  3. Rule out celiac disease with his osteoporosis problem.   RECOMMENDATIONS:  1. Librax p.r.n. as needed for his IBS complaints.  2. Outpatient endoscopy with esophageal biopsies.  3. Followup colonoscopy exam.  4. Advised p.r.n. H2 blocker use in the interim as needed for his acid      reflux.     Michael Rea. Jarold Motto, MD, Michael Lindsey, FAGA  Electronically Signed    DRP/MedQ  DD: 07/22/2007  DT: 07/22/2007  Job #: [id number]   cc:   Michael Lindsey. Michael Ren, MD,FACP,FCCP

## 2011-02-18 NOTE — Op Note (Signed)
NAMEDWON, SKY              ACCOUNT NO.:  [id number]   MEDICAL RECORD NO.:  [id number]          PATIENT TYPE:  AMB   LOCATION:  DSC                          FACILITY:  [facility]   PHYSICIAN:  Thornton Park. Daphine Deutscher, MD  DATE OF BIRTH:  Dec 12, 1950   DATE OF PROCEDURE:  05/18/2008  DATE OF DISCHARGE:                               OPERATIVE REPORT   PREOPERATIVE DIAGNOSIS:  Left inguinal hernia.   POSTOPERATIVE DIAGNOSIS:  Left direct inguinal hernia.   PROCEDURE:  Left inguinal herniorrhaphy with Ethicon Ultra II mesh.   SURGEON:  Thornton Park. Daphine Deutscher, MD   ANESTHESIA:  General by LMA.   DESCRIPTION OF PROCEDURE:  Michael Lindsey is a 60 year old white male  with a left inguinal hernia.  This has been increasing in size.  The  region was marked preoperatively and he was taken back to OR #3 [facility] where the area was clipped and then prepped widely with [facility]  Care and draped sterilely.  The small oblique incision was described  with a marker.  This was made and carried down through Scarpa's to the  external oblique.  Bleeding was controlled with 4-0 Vicryl ties and the  electrocautery.  The area was injected with 1% lidocaine plain  throughout the case and before beginning the dissection.  I incised the  external oblique fibers down to the ring and then mobilized the cord.  Obvious to me was a very large hernia, which my initial suspicion was  that it was an indirect hernia, but upon mobilizing this big sac, found  that it was medial to the epigastric vessels and that involved pretty  much the entire floor.  This was freed up in its entirety and delineated  the anatomy.  A Penrose was around the cord structures.  I did check the  cord structures proximally and saw no evidence of an indirect inguinal  hernia.  I went ahead and imbricated the large direct hernia to tuck it  back inside and then proceeded to repair the floor with a piece of  UltraPro mesh suturing it medially  with a 2-0 Prolene along the inguinal  ligament and medially to the internal obliques cutting and bring it  around the cord structures and then suturing it to itself with two  horizontal mattress sutures of 2-0 Prolene.  Ilioinguinal nerve branches  were spared, although they were somewhat splayed by the size of this  hernia and were retracted with the superior flap.  Again, I injected  with 1% lidocaine.  Hemostasis was present.  The external oblique was  then closed with running 2-0 Vicryl and then the subcutaneous tissue was  closed with 4-0 Vicryl including the Scarpa fascia and then  approximation of the skin occurred with 5-0 Vicryl in a running  subcuticular fashion with Benzoin and Steri-Strips on the skin.  The  patient seemed to tolerate this procedure well.  He will be given  Percocet 5/500 to take for pain and will be followed up in the office in  two weeks.     Thornton Park Daphine Deutscher, MD  Electronically  Signed    MBM/MEDQ  D:  05/18/2008  T:  05/18/2008  Job:  [id number]   cc:   Titus Dubin. Alwyn Ren, MD,FACP,FCCP

## 2011-07-04 LAB — POCT HEMOGLOBIN-HEMACUE: Hemoglobin: 15.3

## 2011-07-10 ENCOUNTER — Encounter: Payer: Self-pay | Admitting: Internal Medicine

## 2011-07-10 ENCOUNTER — Ambulatory Visit (INDEPENDENT_AMBULATORY_CARE_PROVIDER_SITE_OTHER): Payer: BC Managed Care – PPO | Admitting: Internal Medicine

## 2011-07-10 DIAGNOSIS — Z136 Encounter for screening for cardiovascular disorders: Secondary | ICD-10-CM

## 2011-07-10 DIAGNOSIS — Z0000 Encounter for general adult medical examination without abnormal findings: Secondary | ICD-10-CM

## 2011-07-10 DIAGNOSIS — R1310 Dysphagia, unspecified: Secondary | ICD-10-CM

## 2011-07-10 DIAGNOSIS — M810 Age-related osteoporosis without current pathological fracture: Secondary | ICD-10-CM

## 2011-07-10 DIAGNOSIS — Z23 Encounter for immunization: Secondary | ICD-10-CM

## 2011-07-10 MED ORDER — ZOSTER VACCINE LIVE 19400 UNT/0.65ML ~~LOC~~ SOLR: 0.6500 mL | Freq: Once | SUBCUTANEOUS | Status: DC

## 2011-07-10 NOTE — Progress Notes (Signed)
Subjective:    Patient ID: Michael Lindsey, male    DOB: 27-Sep-1951, 60 y.o.   MRN: [id number]  HPI  Michael Lindsey  is here for a physical; he has acute issues.      Review of Systems Patient reports no  vision/ hearing changes,anorexia, weight change, fever ,adenopathy, persistant / recurrent hoarseness, swallowing issues, chest pain,palpitations, edema,persistant / recurrent cough, hemoptysis, dyspnea(rest, exertional, paroxysmal nocturnal), gastrointestinal  bleeding (melena, rectal bleeding), abdominal pain, excessive heart burn, GU symptoms( dysuria, hematuria, pyuria, voiding/incontinence  issues) syncope, focal weakness, memory loss,numbness & tingling, skin/hair/nail changes,depression, anxiety, abnormal bruising/bleeding, musculoskeletal symptoms/signs.      Objective:   Physical Exam Gen.: Thin but healthy and well-nourished in appearance. Alert, appropriate and cooperative throughout exam. Head: Normocephalic without obvious abnormalities  Eyes: No corneal or conjunctival inflammation noted. Pupils equal round reactive to light and accommodation. Fundal exam is benign without hemorrhages, exudate, papilledema. Extraocular motion intact. Vision grossly normal. Ears: External  ear exam reveals no significant lesions or deformities. Canals clear .TMs normal. Hearing is grossly normal bilaterally. Nose: External nasal exam reveals no deformity or inflammation. Nasal mucosa are pink and moist. No lesions or exudates noted.  Mouth: Oral mucosa and oropharynx reveal no lesions or exudates. Teeth in good repair. Neck: No deformities, masses, or tenderness noted. Range of motion slightly decreased. Thyroid normal. Lungs: Normal respiratory effort; chest expands symmetrically. Lungs are clear to auscultation without rales, wheezes, or increased work of breathing. Heart: Slow rate ;regular rhythm. Normal S1 and S2. No gallop, click, or rub. No  murmur. Abdomen: Bowel sounds normal; abdomen soft  and nontender. No masses, organomegaly or hernias noted. Genitalia/DRE: Genital exam is unremarkable; right prostate is upper limits of normal in size without induration or nodularity. Left lobe is small and soft . Note:His PSA was 0.18 in 2006. There is no family history of prostate disease.  .                                                                                   Musculoskeletal/extremities: No deformity or scoliosis noted of  the thoracic or lumbar spine but slight asymmetry of thoracic muscles.. No clubbing, cyanosis, edema, or deformity noted. Range of motion of extremities normal .Tone & strength  normal.Joints normal. Nail health  Good.Minimal pectus excavatum Vascular: Carotid, radial artery, dorsalis pedis and  posterior tibial pulses are full and equal. No bruits present. Neurologic: Alert and oriented x3. Deep tendon reflexes symmetrical and normal.          Skin: Intact without suspicious lesions or rashes. Lymph: No cervical, axillary, or inguinal lymphadenopathy present. Psych: Mood and affect are normal. Normally interactive  Assessment & Plan:  #1 comprehensive physical exam; no acute findings #2 see Problem List with Assessments & Recommendations Plan: see Orders

## 2011-07-10 NOTE — Patient Instructions (Signed)
Preventive Health Care: Exercise at least 30-45 minutes a day,  3-4 days a week.  Eat a low-fat diet with lots of fruits and vegetables, up to 7-9 servings per day. Please  schedule fasting Labs : BMET,Lipids, hepatic panel, CBC & dif, TSH, vit D level (V70.0).  Please bring these instructions to that Lab appt. Marland Kitchen

## 2011-07-14 ENCOUNTER — Telehealth: Payer: Self-pay | Admitting: Gastroenterology

## 2011-07-14 DIAGNOSIS — Z0000 Encounter for general adult medical examination without abnormal findings: Secondary | ICD-10-CM

## 2011-07-14 NOTE — Telephone Encounter (Signed)
Pt requests our lab drawn blood for his upcoming physical since our office is close. Per Dr Frederik Pear ofc note on 07/10/11 and the Code V70.0, labs entered.

## 2011-07-25 ENCOUNTER — Other Ambulatory Visit (INDEPENDENT_AMBULATORY_CARE_PROVIDER_SITE_OTHER): Payer: BC Managed Care – PPO

## 2011-07-25 DIAGNOSIS — Z0000 Encounter for general adult medical examination without abnormal findings: Secondary | ICD-10-CM

## 2011-07-25 LAB — HEPATIC FUNCTION PANEL
ALT: 19 U/L (ref 0–53)
AST: 25 U/L (ref 0–37)
Albumin: 3.7 g/dL (ref 3.5–5.2)
Total Bilirubin: 0.9 mg/dL (ref 0.3–1.2)
Total Protein: 6.3 g/dL (ref 6.0–8.3)

## 2011-07-25 LAB — LIPID PANEL
Cholesterol: 148 mg/dL (ref 0–200)
HDL: 52.9 mg/dL (ref >39.00)
Triglycerides: 62 mg/dL (ref 0.0–149.0)

## 2011-07-25 LAB — CBC WITH DIFFERENTIAL/PLATELET
Eosinophils Relative: 3.9 % (ref 0.0–5.0)
MCV: 98.9 fl (ref 78.0–100.0)
Monocytes Absolute: 0.6 10*3/uL (ref 0.1–1.0)
Neutrophils Relative %: 71.9 % (ref 43.0–77.0)
Platelets: 197 10*3/uL (ref 150.0–400.0)
WBC: 6.6 10*3/uL (ref 4.5–10.5)

## 2011-07-25 LAB — BASIC METABOLIC PANEL
Calcium: 8.6 mg/dL (ref 8.4–10.5)
GFR: 99.96 mL/min (ref >60.00)
Sodium: 140 mEq/L (ref 135–145)

## 2011-07-25 LAB — TSH: TSH: 1.4 u[IU]/mL (ref 0.35–5.50)

## 2011-07-26 LAB — VITAMIN D 25 HYDROXY (VIT D DEFICIENCY, FRACTURES): Vit D, 25-Hydroxy: 58 ng/mL (ref 30–89)

## 2012-08-11 ENCOUNTER — Ambulatory Visit (INDEPENDENT_AMBULATORY_CARE_PROVIDER_SITE_OTHER): Payer: BC Managed Care – PPO | Admitting: Internal Medicine

## 2012-08-11 ENCOUNTER — Encounter: Payer: Self-pay | Admitting: Internal Medicine

## 2012-08-11 VITALS — BP 108/70 | HR 70 | Temp 98.1°F | Resp 12 | Ht 72.5 in | Wt 167.4 lb

## 2012-08-11 DIAGNOSIS — C4490 Unspecified malignant neoplasm of skin, unspecified: Secondary | ICD-10-CM | POA: Insufficient documentation

## 2012-08-11 DIAGNOSIS — M949 Disorder of cartilage, unspecified: Secondary | ICD-10-CM

## 2012-08-11 DIAGNOSIS — M8580 Other specified disorders of bone density and structure, unspecified site: Secondary | ICD-10-CM

## 2012-08-11 DIAGNOSIS — Z0000 Encounter for general adult medical examination without abnormal findings: Secondary | ICD-10-CM

## 2012-08-11 NOTE — Progress Notes (Signed)
  Subjective:    Patient ID: Michael Lindsey, male    DOB: 09/21/1951, 61 y.o.   MRN: [id number]  HPI  Mr Reinoso is here for a physical;he denies significant active issues .      Review of Systems He describes intermittent knee pain with overuse. He has had osteoporosis in the past; followup bone density showed only osteopenia. He is overdue for repeat bone density. He is on vitamin D and calcium.     Objective:   Physical Exam Gen.: Thin but healthy and well-nourished in appearance. Alert, appropriate and cooperative throughout exam. Head: Normocephalic without obvious abnormalities  Eyes: No corneal or conjunctival inflammation noted. Pupils equal round reactive to light and accommodation. Fundal exam is benign without hemorrhages, exudate, papilledema. Extraocular motion intact. Vision grossly normal. Ears: External  ear exam reveals no significant lesions or deformities. Canals clear .TMs normal. Hearing is grossly normal bilaterally. Nose: External nasal exam reveals no deformity or inflammation. Nasal mucosa are pink and moist. No lesions or exudates noted.  Mouth: Oral mucosa and oropharynx reveal no lesions or exudates. Teeth in good repair. Neck: No deformities, masses, or tenderness noted. Range of motion slightly decreased. Thyroid normal. Lungs: Normal respiratory effort; chest expands symmetrically. Lungs are clear to auscultation without rales, wheezes, or increased work of breathing. Heart: Normal rate and rhythm. Normal S1 and S2. No gallop, click, or rub. S4 w/o  murmur. Abdomen: Bowel sounds normal; abdomen soft and nontender. No masses, organomegaly or hernias noted. Genitalia/DRE: Genitalia normal except for left varices. Prostate reveals slight asymmetry without enlargement,  nodularity, or induration.  Musculoskeletal/extremities: No deformity or scoliosis noted of  the thoracic or lumbar spine. No clubbing, cyanosis, edema, or deformity noted. Range of motion  normal  .Tone & strength  normal.Joints normal. Nail health  good. Vascular: Carotid, radial artery, dorsalis pedis and  posterior tibial pulses are full and equal. No bruits present. Neurologic: Alert and oriented x3. Deep tendon reflexes symmetrical and normal.          Skin: Intact without suspicious lesions or rashes.Extranummary areola on R Lymph: No cervical, axillary, or inguinal lymphadenopathy present. Psych: Mood and affect are normal. Normally interactive                                                                                        Assessment & Plan:  #1 comprehensive physical exam; no acute findings Plan: see Orders

## 2012-08-11 NOTE — Patient Instructions (Addendum)
Preventive Health Care: Exercise at least 30-45 minutes a day,  3-4 days a week.  Eat a low-fat diet with lots of fruits and vegetables, up to 7-9 servings per day.  Consume less than 40 grams of sugar per day from foods & drinks with High Fructose Corn Sugar as #1,2,3 or # 4 on label. Please  schedule fasting Labs : BMET,Lipids, hepatic panel, CBC & dif, TSH, Vit D 1,25 dihydroxy.PLEASE BRING THESE INSTRUCTIONS TO FOLLOW UP  LAB APPOINTMENT.This will guarantee correct labs are drawn, eliminating need for repeat blood sampling ( needle sticks ! ). Diagnoses /Codes: V70.0. If you activate My Chart; the results can be released to you as soon as they populate from the lab. If you choose not to use this program; the labs have to be reviewed, copied & mailed   causing a delay in getting the results to you.

## 2012-08-20 ENCOUNTER — Ambulatory Visit (INDEPENDENT_AMBULATORY_CARE_PROVIDER_SITE_OTHER)
Admission: RE | Admit: 2012-08-20 | Discharge: 2012-08-20 | Disposition: A | Discharge status: Home / Self Care | Payer: BC Managed Care – PPO | Source: Ambulatory Visit | Attending: Internal Medicine | Admitting: Internal Medicine

## 2012-08-20 ENCOUNTER — Other Ambulatory Visit: Payer: Self-pay | Admitting: Internal Medicine

## 2012-08-20 ENCOUNTER — Other Ambulatory Visit (INDEPENDENT_AMBULATORY_CARE_PROVIDER_SITE_OTHER): Payer: BC Managed Care – PPO

## 2012-08-20 DIAGNOSIS — M8580 Other specified disorders of bone density and structure, unspecified site: Secondary | ICD-10-CM

## 2012-08-20 DIAGNOSIS — Z0000 Encounter for general adult medical examination without abnormal findings: Secondary | ICD-10-CM

## 2012-08-20 DIAGNOSIS — M899 Disorder of bone, unspecified: Secondary | ICD-10-CM

## 2012-08-20 LAB — CBC WITH DIFFERENTIAL/PLATELET
Basophils Absolute: 0 10*3/uL (ref 0.0–0.1)
Hemoglobin: 15.6 g/dL (ref 13.0–17.0)
Lymphocytes Relative: 17 % (ref 12.0–46.0)
Monocytes Relative: 9.4 % (ref 3.0–12.0)
Platelets: 205 10*3/uL (ref 150.0–400.0)
RDW: 12.8 % (ref 11.5–14.6)

## 2012-08-20 LAB — TSH: TSH: 1.82 u[IU]/mL (ref 0.35–5.50)

## 2012-08-25 ENCOUNTER — Other Ambulatory Visit: Payer: Self-pay | Admitting: Internal Medicine

## 2012-08-25 DIAGNOSIS — Z0000 Encounter for general adult medical examination without abnormal findings: Secondary | ICD-10-CM

## 2012-08-25 LAB — VITAMIN D 1,25 DIHYDROXY
Vitamin D 1, 25 (OH)2 Total: 57 pg/mL (ref 18–72)
Vitamin D2 1, 25 (OH)2: <8 pg/mL
Vitamin D3 1, 25 (OH)2: 57 pg/mL

## 2012-09-16 ENCOUNTER — Telehealth: Payer: Self-pay | Admitting: Internal Medicine

## 2012-09-16 NOTE — Telephone Encounter (Signed)
Findings : lowest T score - 1.9 @  Spine. STABLE vs last study Diagnosis: significant Osteopenia Recommended lifestyle interventions for Osteoporosis include calcium 600 mg daily  & vitamin D3 supplementation to keep vit D  level @ least 40-60. The usual vitamin D3 dose is 1000 IU daily; but individual dose is determined by annual vitamin D level monitor. Also weight bearing exercise such as  walking 30-45 minutes 3-4  X per week is recommended. BMD every 25 months. [employer]

## 2012-09-16 NOTE — Telephone Encounter (Signed)
I called patient, Dr.Hopper was standing front of me and spoke directly with the patient

## 2012-09-16 NOTE — Telephone Encounter (Signed)
Patient would like someone to call him with bone density results from 08/20/12.

## 2013-06-01 ENCOUNTER — Encounter: Payer: Self-pay | Admitting: Family Medicine

## 2013-06-01 ENCOUNTER — Ambulatory Visit (INDEPENDENT_AMBULATORY_CARE_PROVIDER_SITE_OTHER): Payer: BC Managed Care – PPO | Admitting: Family Medicine

## 2013-06-01 VITALS — BP 110/78 | HR 80 | Temp 97.9°F | Ht 72.0 in | Wt 171.0 lb

## 2013-06-01 DIAGNOSIS — J309 Allergic rhinitis, unspecified: Secondary | ICD-10-CM | POA: Insufficient documentation

## 2013-06-01 NOTE — Assessment & Plan Note (Signed)
New.  No evidence of infxn.  No need for abx.  Start OTC anti-histamine.  Cough meds prn.  Reviewed supportive care and red flags that should prompt return.  Pt expressed understanding and is in agreement w/ plan.

## 2013-06-01 NOTE — Patient Instructions (Addendum)
This appears to be allergy related Drink plenty of fluids Start OTC Claritin or Zyrtec for the seasonal allergy component Mucinex or Mucinex DM as needed for congestion/cough Call with any questions or concerns Hang in there!

## 2013-06-01 NOTE — Progress Notes (Signed)
  Subjective:    Patient ID: Michael Lindsey, male    DOB: 1951/05/05, 62 y.o.   MRN: [id number]  HPI URI- close contact was dx'd w/ Bronchitis on Monday.  Yesterday developed chest tightness but no cough, sore throat.  No fevers.  No nasal congestion.  No PND.  No ear pain.  No N/V/D.   Review of Systems For ROS see HPI     Objective:   Physical Exam  Vitals reviewed. Constitutional: He appears well-developed and well-nourished. No distress.  HENT:  Head: Normocephalic and atraumatic.  No TTP over sinuses + turbinate edema + PND TMs normal bilaterally  Eyes: Conjunctivae and EOM are normal. Pupils are equal, round, and reactive to light.  Neck: Normal range of motion. Neck supple.  Cardiovascular: Normal rate, regular rhythm and normal heart sounds.   Pulmonary/Chest: Effort normal and breath sounds normal. No respiratory distress. He has no wheezes.  Lymphadenopathy:    He has no cervical adenopathy.  Skin: Skin is warm and dry.          Assessment & Plan:

## 2013-06-08 ENCOUNTER — Ambulatory Visit: Payer: BC Managed Care – PPO | Admitting: Family Medicine

## 2013-06-08 VITALS — BP 112/60 | HR 83 | Temp 98.9°F | Resp 18 | Ht 72.0 in | Wt 171.0 lb

## 2013-06-08 DIAGNOSIS — R059 Cough, unspecified: Secondary | ICD-10-CM

## 2013-06-08 DIAGNOSIS — J209 Acute bronchitis, unspecified: Secondary | ICD-10-CM

## 2013-06-08 DIAGNOSIS — R05 Cough: Secondary | ICD-10-CM

## 2013-06-08 MED ORDER — HYDROCODONE-HOMATROPINE 5-1.5 MG/5ML PO SYRP: 5.0000 mL | ORAL_SOLUTION | Freq: Every evening | ORAL | Status: DC | PRN

## 2013-06-08 MED ORDER — AZITHROMYCIN 250 MG PO TABS: ORAL_TABLET | ORAL | Status: DC

## 2013-06-08 NOTE — Patient Instructions (Signed)
Bronchitis Bronchitis is the body's way of reacting to injury and/or infection (inflammation) of the bronchi. Bronchi are the air tubes that extend from the windpipe into the lungs. If the inflammation becomes severe, it may cause shortness of breath. CAUSES  Inflammation may be caused by:  A virus.  Germs (bacteria).  Dust.  Allergens.  Pollutants and many other irritants. The cells lining the bronchial tree are covered with tiny hairs (cilia). These constantly beat upward, away from the lungs, toward the mouth. This keeps the lungs free of pollutants. When these cells become too irritated and are unable to do their job, mucus begins to develop. This causes the characteristic cough of bronchitis. The cough clears the lungs when the cilia are unable to do their job. Without either of these protective mechanisms, the mucus would settle in the lungs. Then you would develop pneumonia. Smoking is a common cause of bronchitis and can contribute to pneumonia. Stopping this habit is the single most important thing you can do to help yourself. TREATMENT   Your caregiver may prescribe an antibiotic if the cough is caused by bacteria. Also, medicines that open up your airways make it easier to breathe. Your caregiver may also recommend or prescribe an expectorant. It will loosen the mucus to be coughed up. Only take over-the-counter or prescription medicines for pain, discomfort, or fever as directed by your caregiver.  Removing whatever causes the problem (smoking, for example) is critical to preventing the problem from getting worse.  Cough suppressants may be prescribed for relief of cough symptoms.  Inhaled medicines may be prescribed to help with symptoms now and to help prevent problems from returning.  For those with recurrent (chronic) bronchitis, there may be a need for steroid medicines. SEEK IMMEDIATE MEDICAL CARE IF:   During treatment, you develop more pus-like mucus (purulent  sputum).  You have a fever.  Your baby is older than 3 months with a rectal temperature of 102 F (38.9 C) or higher.  Your baby is 3 months old or younger with a rectal temperature of 100.4 F (38 C) or higher.  You become progressively more ill.  You have increased difficulty breathing, wheezing, or shortness of breath. It is necessary to seek immediate medical care if you are elderly or sick from any other disease. MAKE SURE YOU:   Understand these instructions.  Will watch your condition.  Will get help right away if you are not doing well or get worse. Document Released: 09/22/2005 Document Revised: 12/15/2011 Document Reviewed: 08/01/2008 Exit[employer] Patient Information 2014 [employer].  

## 2013-06-08 NOTE — Progress Notes (Signed)
[facility]:  Office Visit  Chief Complaint:  Chief Complaint  Patient presents with  . tightness in chest with cough and congestion    since the weekend   . pulled muscle right lower side of back    pain started monday after playing golf     HPI: Michael Lindsey is a 62 y.o. male who complains of here with chest congestion, dry cough , subjective fever on Saturday. Sxs started 1 week ago, and was with friend in [place] who had URI. Playing golf. No other travels.Marland Kitchen He is a former smoker, has allergies but no asthma. Deneis SOb and wheezing. Has tried mucinex and claritin without releif. He is coughing at night. He denies SOB, wheezing. He is an Air cabin crew, denies coughing up blood or night sweats, weightloss.   He thinks he may pulled a msk golfing, improved, No urinary sxs  Past Medical History  Diagnosis Date  . Hepatitis A 1972    from exposure to septic tank which malfunctioned  . GERD (gastroesophageal reflux disease)   . Osteoporosis   . Fracture     L arm @ 10; 3 ribs with shoulder separation & PTX 2003  . Allergy   . Arthritis    Past Surgical History  Procedure Laterality Date  . Hernia repair    . Tooth extraction    . Upper gastrointestinal endoscopy  2003  . Colonoscopy  2011    Dr Jarold Motto  . Skin cancer excision  2011    R calf, Dr Donzetta Starch   History   Social History  . Marital Status: Married    Spouse Name: N/A    Number of Children: N/A  . Years of Education: N/A   Occupational History  . IMMIGRATION ATTORNEY    Social History Main Topics  . Smoking status: Former Smoker    Quit date: 10/06/1980  . Smokeless tobacco: None     Comment: intermittent smoker before quitting  . Alcohol Use: 0.0 oz/week     Comment: SOCIALLY, < 5 glasses/ week  . Drug Use: No  . Sexual Activity: None   Other Topics Concern  . None   Social History Narrative   GETS REG EXERCISE         Family History  Problem Relation Age of  Onset  . Depression Mother   . Tuberculosis Mother   . Coronary artery disease Father   . Emphysema Father   . Colon cancer Father   . Depression Brother   . Alcohol abuse Paternal Uncle   . Diabetes Neg Hx   . Stroke Neg Hx    Allergies  Allergen Reactions  . Chlorine     EYES WATER/NASAL CONGESTION   Prior to Admission medications   Medication Sig Start Date End Date Taking? Authorizing Provider  guaiFENesin (MUCINEX) 600 MG 12 hr tablet Take 1,200 mg by mouth 2 (two) times daily.   Yes Historical Provider, MD  loratadine-pseudoephedrine (CLARITIN-D 12-HOUR) 5-120 MG per tablet Take 1 tablet by mouth 2 (two) times daily.   Yes Historical Provider, MD     ROS: The patient denies night sweats, unintentional weight loss, chest pain, palpitations, wheezing, dyspnea on exertion, nausea, vomiting, abdominal pain, dysuria, hematuria, melena, numbness, weakness, or tingling.   All other systems have been reviewed and were otherwise negative with the exception of those mentioned in the HPI and as above.    PHYSICAL EXAM: Filed Vitals:   06/08/13 1358  BP:  112/60  Pulse: 83  Temp: 98.9 F (37.2 C)  Resp: 18   Filed Vitals:   06/08/13 1358  Height: 6' (1.829 m)  Weight: 171 lb (77.565 kg)   Body mass index is 23.19 kg/(m^2).  General: Alert, no acute distress HEENT:  Normocephalic, atraumatic, oropharynx patent. EOMI, PERRLA Cardiovascular:  Regular rate and rhythm, no rubs murmurs or gallops.  No Carotid bruits, radial pulse intact. No pedal edema.  Respiratory: Clear to auscultation bilaterally.  No wheezes, rales, or rhonchi.  No cyanosis, no use of accessory musculature GI: No organomegaly, abdomen is soft and non-tender, positive bowel sounds.  No masses. Skin: No rashes. Neurologic: Facial musculature symmetric. Psychiatric: Patient is appropriate throughout our interaction. Lymphatic: No cervical lymphadenopathy Musculoskeletal: Gait intact.   LABS: Results for  orders placed in visit on 08/20/12  CBC WITH DIFFERENTIAL      Result Value Range   WBC 6.0  4.5 - 10.5 K/uL   RBC 4.70  4.22 - 5.81 Mil/uL   Hemoglobin 15.6  13.0 - 17.0 g/dL   HCT 47.8  29.5 - 62.1 %   MCV 97.5  78.0 - 100.0 fl   MCHC 34.0  30.0 - 36.0 g/dL   RDW 30.8  65.7 - 84.6 %   Platelets 205.0  150.0 - 400.0 K/uL   Neutrophils Relative % 67.3  43.0 - 77.0 %   Lymphocytes Relative 17.0  12.0 - 46.0 %   Monocytes Relative 9.4  3.0 - 12.0 %   Eosinophils Relative 5.9 (*) 0.0 - 5.0 %   Basophils Relative 0.4  0.0 - 3.0 %   Neutro Abs 4.0  1.4 - 7.7 K/uL   Lymphs Abs 1.0  0.7 - 4.0 K/uL   Monocytes Absolute 0.6  0.1 - 1.0 K/uL   Eosinophils Absolute 0.4  0.0 - 0.7 K/uL   Basophils Absolute 0.0  0.0 - 0.1 K/uL  TSH      Result Value Range   TSH 1.82  0.35 - 5.50 uIU/mL  VITAMIN D 1,25 DIHYDROXY      Result Value Range   Vitamin D 1, 25 (OH) Total 57  18 - 72 pg/mL   Vitamin D3 1, 25 (OH) 57     Vitamin D2 1, 25 (OH) <8       EKG/XRAY:   Primary read interpreted by Dr. Conley Rolls at [facility].   ASSESSMENT/PLAN: Encounter Diagnoses  Name Primary?  . Acute bronchitis Yes  . Cough    Rx Hycodan Rx Z pack Declined tessalon perles, will take mucinex Defer chest xray  If worsening sxs then will order one Gross sideeffects, risk and benefits, and alternatives of medications d/w patient. Patient is aware that all medications have potential sideeffects and we are unable to predict every sideeffect or drug-drug interaction that may occur.  Hamilton Capri PHUONG, DO 06/08/2013 3:33 PM

## 2013-06-10 ENCOUNTER — Ambulatory Visit: Payer: BC Managed Care – PPO | Admitting: Family Medicine

## 2013-07-14 ENCOUNTER — Encounter: Payer: Self-pay | Admitting: Internal Medicine

## 2013-08-11 ENCOUNTER — Telehealth: Payer: Self-pay

## 2013-08-11 NOTE — Telephone Encounter (Addendum)
Left message for call back Identifiable  Medication List and allergies: reviewed and updated  90 day supply/mail order: na Local prescriptions: [facility] and [address]  Immunizations due: Tdap  A/P No changes to FH or PSH Bone Density 11/2012 Zostavax-07/2012 Endoscopy--09/2007 PSA--no notation  Flu--07/2013 [facility]--2011--Dr Patterson--[facility]  To Discuss with Provider: Tinnitus x several months Would like Hep C screening

## 2013-08-12 ENCOUNTER — Ambulatory Visit (INDEPENDENT_AMBULATORY_CARE_PROVIDER_SITE_OTHER): Payer: BC Managed Care – PPO | Admitting: Internal Medicine

## 2013-08-12 ENCOUNTER — Other Ambulatory Visit: Payer: Self-pay | Admitting: Internal Medicine

## 2013-08-12 ENCOUNTER — Encounter: Payer: Self-pay | Admitting: Internal Medicine

## 2013-08-12 VITALS — BP 117/68 | HR 57 | Temp 97.8°F | Ht 72.25 in | Wt 167.0 lb

## 2013-08-12 DIAGNOSIS — Z23 Encounter for immunization: Secondary | ICD-10-CM

## 2013-08-12 DIAGNOSIS — Z0000 Encounter for general adult medical examination without abnormal findings: Secondary | ICD-10-CM

## 2013-08-12 DIAGNOSIS — J309 Allergic rhinitis, unspecified: Secondary | ICD-10-CM

## 2013-08-12 DIAGNOSIS — M899 Disorder of bone, unspecified: Secondary | ICD-10-CM

## 2013-08-12 DIAGNOSIS — M8580 Other specified disorders of bone density and structure, unspecified site: Secondary | ICD-10-CM

## 2013-08-12 DIAGNOSIS — R1310 Dysphagia, unspecified: Secondary | ICD-10-CM

## 2013-08-12 DIAGNOSIS — D649 Anemia, unspecified: Secondary | ICD-10-CM

## 2013-08-12 LAB — CBC WITH DIFFERENTIAL/PLATELET
Basophils Relative: 0.4 % (ref 0.0–3.0)
Eosinophils Absolute: 0.2 10*3/uL (ref 0.0–0.7)
Eosinophils Relative: 2.8 % (ref 0.0–5.0)
Lymphocytes Relative: 34.3 % (ref 12.0–46.0)
Neutrophils Relative %: 55.4 % (ref 43.0–77.0)
RBC: 4.11 Mil/uL — ABNORMAL LOW (ref 4.22–5.81)
WBC: 5.9 10*3/uL (ref 4.5–10.5)

## 2013-08-12 LAB — HEPATIC FUNCTION PANEL
ALT: 18 U/L (ref 0–53)
Alkaline Phosphatase: 59 U/L (ref 39–117)
Bilirubin, Direct: 0.1 mg/dL (ref 0.0–0.3)
Total Bilirubin: 1.1 mg/dL (ref 0.3–1.2)
Total Protein: 6.5 g/dL (ref 6.0–8.3)

## 2013-08-12 LAB — TSH: TSH: 1.37 u[IU]/mL (ref 0.35–5.50)

## 2013-08-12 LAB — LIPID PANEL
HDL: 57.1 mg/dL (ref >39.00)
LDL Cholesterol: 75 mg/dL (ref 0–99)
Total CHOL/HDL Ratio: 2

## 2013-08-12 LAB — BASIC METABOLIC PANEL
Calcium: 8.8 mg/dL (ref 8.4–10.5)
Creatinine, Ser: 0.8 mg/dL (ref 0.4–1.5)

## 2013-08-12 NOTE — Patient Instructions (Signed)
Plain Mucinex (NOT D) for thick secretions ;force NON dairy fluids .   Nasal cleansing in the shower as discussed with lather of mild shampoo.After 10 seconds wash off lather while  exhaling through nostrils. Make sure that all residual soap is removed to prevent irritation.  Nasacort AQ OTC  1 spray in each nostril twice a day as needed. Use the "crossover" technique into opposite nostril spraying toward opposite ear @ 45 degree angle, not straight up into nostril.  Use a Neti pot daily only  as needed for significant sinus congestion; going from open side to congested side . Plain Allegra (NOT D )  160 daily , Loratidine 10 mg , OR Zyrtec 10 mg @ bedtime  as needed for itchy eyes & sneezing.   Your next office appointment will be determined based upon review of your pending labs . Those instructions will be transmitted to you through My Chart  .

## 2013-08-12 NOTE — Progress Notes (Signed)
  Subjective:    Patient ID: EVO ADERMAN, male    DOB: Sep 30, 1951, 62 y.o.   MRN: [id number]  HPI  He is here for a physical;acute issues include low grade tinnitus.     Review of Systems  He questions relationship to swimming. He is not exposed to excessive noise or excess aspirin intake. A modified heart healthy diet is followed; exercise encompasses 60 minutes 5-6  times per week as walking , weights ,tennis & swimming without symptoms.  Family history is negative for premature coronary disease. Low dose ASA not taken due to GERD. Specifically denied are  chest pain, palpitations, dyspnea, or claudication.       Objective:   Physical Exam Gen.: Thin but healthy and well-nourished in appearance. Alert, appropriate and cooperative throughout exam.  Head: Normocephalic without obvious abnormalities Eyes: No corneal or conjunctival inflammation noted. Pupils equal round reactive to light and accommodation. Extraocular motion intact.  Ears: External  ear exam reveals no significant lesions or deformities. Canals clear .TMs normal. Hearing is grossly normal bilaterally. Tuning fork exam normal. Nose: External nasal exam reveals no deformity or inflammation. Nasal mucosa are pink and moist. No lesions or exudates noted.   Mouth: Oral mucosa and oropharynx reveal no lesions or exudates. Teeth in good repair. Neck: No deformities, masses, or tenderness noted. Range of motion decreased. Thyroid normal. Lungs: Normal respiratory effort; chest expands symmetrically. Lungs are clear to auscultation without rales, wheezes, or increased work of breathing. Heart: Slow rate and regular rhythm. Normal S1 and S2. No gallop, click, or rub. No murmur. Abdomen: Bowel sounds normal; abdomen soft and nontender. No masses, organomegaly or hernias noted. Genitalia: Genitalia normal except for small left varices. Prostate is normal without enlargement, asymmetry, nodularity, or induration                                    Musculoskeletal/extremities: No deformity or scoliosis noted of  the thoracic or lumbar spine.  No clubbing, cyanosis, edema, or significant extremity  deformity noted. Range of motion normal .Tone & strength normal. Hand joints normal . Fingernail / toenail health good. Pesplanus Able to lie down & sit up w/o help. Negative SLR bilaterally Vascular: Carotid, radial artery, dorsalis pedis and  posterior tibial pulses are full and equal. No bruits present. Neurologic: Alert and oriented x3. Deep tendon reflexes symmetrical and normal.       Skin: Intact without suspicious lesions or rashes. Lymph: No cervical, axillary, or inguinal lymphadenopathy present. Psych: Mood and affect are normal. Normally interactive                                                                                        Assessment & Plan:  #1 comprehensive physical exam; no acute findings  Plan: see Orders  & Recommendations

## 2013-08-12 NOTE — Progress Notes (Signed)
Pre-visit discussion using our clinic review tool. No additional management support is needed unless otherwise documented below in the visit note.  

## 2013-08-19 LAB — VITAMIN D 1,25 DIHYDROXY: Vitamin D2 1, 25 (OH)2: <8 pg/mL

## 2013-08-31 ENCOUNTER — Other Ambulatory Visit (INDEPENDENT_AMBULATORY_CARE_PROVIDER_SITE_OTHER): Payer: BC Managed Care – PPO

## 2013-08-31 DIAGNOSIS — D649 Anemia, unspecified: Secondary | ICD-10-CM

## 2013-08-31 LAB — CBC WITH DIFFERENTIAL/PLATELET
Basophils Absolute: 0 10*3/uL (ref 0.0–0.1)
Eosinophils Absolute: 0.4 10*3/uL (ref 0.0–0.7)
MCHC: 34.9 g/dL (ref 30.0–36.0)
MCV: 94.7 fl (ref 78.0–100.0)
Monocytes Absolute: 0.8 10*3/uL (ref 0.1–1.0)
Neutrophils Relative %: 71.2 % (ref 43.0–77.0)
Platelets: 193 10*3/uL (ref 150.0–400.0)
RDW: 13.6 % (ref 11.5–14.6)

## 2013-08-31 LAB — IBC PANEL
Iron: 45 ug/dL (ref 42–165)
Transferrin: 222.7 mg/dL (ref 212.0–360.0)

## 2013-08-31 LAB — VITAMIN B12: Vitamin B-12: 752 pg/mL (ref 211–911)

## 2013-09-08 ENCOUNTER — Encounter: Payer: Self-pay | Admitting: Lab

## 2013-09-08 ENCOUNTER — Other Ambulatory Visit (INDEPENDENT_AMBULATORY_CARE_PROVIDER_SITE_OTHER): Payer: BC Managed Care – PPO

## 2013-09-08 DIAGNOSIS — Z1289 Encounter for screening for malignant neoplasm of other sites: Secondary | ICD-10-CM

## 2013-09-08 DIAGNOSIS — Z0000 Encounter for general adult medical examination without abnormal findings: Secondary | ICD-10-CM

## 2013-09-08 LAB — HEMOCCULT GUIAC POC 1CARD (OFFICE): Card #3 Fecal Occult Blood, POC: NEGATIVE

## 2013-11-03 ENCOUNTER — Encounter: Payer: Self-pay | Admitting: Family Medicine

## 2013-11-03 ENCOUNTER — Ambulatory Visit (INDEPENDENT_AMBULATORY_CARE_PROVIDER_SITE_OTHER): Payer: BC Managed Care – PPO | Admitting: Family Medicine

## 2013-11-03 VITALS — BP 120/86 | HR 77 | Temp 97.7°F | Resp 16 | Wt 173.0 lb

## 2013-11-03 DIAGNOSIS — L309 Dermatitis, unspecified: Secondary | ICD-10-CM | POA: Insufficient documentation

## 2013-11-03 DIAGNOSIS — L259 Unspecified contact dermatitis, unspecified cause: Secondary | ICD-10-CM

## 2013-11-03 MED ORDER — TRIAMCINOLONE ACETONIDE 0.1 % EX OINT: 1.0000 "application " | TOPICAL_OINTMENT | Freq: Two times a day (BID) | CUTANEOUS | Status: DC

## 2013-11-03 NOTE — Progress Notes (Signed)
   Subjective:    Patient ID: Michael Lindsey, male    DOB: 08-05-1951, 63 y.o.   MRN: [id number]  HPI Red spot- pt reports ~1 week ago had 50 cent sized red spot on ribs, disappeared spontaneously after 1 day.  Subsequently developed small red bump at what would have been the center.  Read article on Lyme disease and thought he should be checked.  Not painful.  + itchy.  No household contacts w/ similar, no outdoor activities.  Otherwise feeling well- no fevers, sore throat, joint pains.   Review of Systems For ROS see HPI     Objective:   Physical Exam  Vitals reviewed. Constitutional: He appears well-developed and well-nourished. No distress.  Skin: Skin is warm and dry. Rash (very fine maculopapular rash on L flank, itchy, dry) noted.          Assessment & Plan:

## 2013-11-03 NOTE — Patient Instructions (Signed)
Follow up as needed Start the Triamcinolone ointment twice daily as needed Continue to moisturize daily Call with any questions or concerns Hang in there!

## 2013-11-03 NOTE — Progress Notes (Signed)
Pre-visit discussion using our clinic review too, as applicable. No additional management support is needed unless otherwise documented below in the visit note.

## 2013-11-03 NOTE — Assessment & Plan Note (Signed)
New.  Suspect this is due to excessively dry skin.  Start topical steroid ointment prn.  Continue daily moisturizing.

## 2014-10-06 HISTORY — PX: OTHER SURGICAL HISTORY: SHX169

## 2014-10-08 ENCOUNTER — Emergency Department (HOSPITAL_COMMUNITY)
Admission: EM | Admit: 2014-10-08 | Discharge: 2014-10-08 | Disposition: A | Discharge status: Home / Self Care | Payer: BC Managed Care – PPO | Attending: Emergency Medicine | Admitting: Emergency Medicine

## 2014-10-08 ENCOUNTER — Encounter (HOSPITAL_COMMUNITY): Payer: Self-pay | Admitting: Emergency Medicine

## 2014-10-08 DIAGNOSIS — Z8739 Personal history of other diseases of the musculoskeletal system and connective tissue: Secondary | ICD-10-CM | POA: Diagnosis not present

## 2014-10-08 DIAGNOSIS — Z7952 Long term (current) use of systemic steroids: Secondary | ICD-10-CM | POA: Insufficient documentation

## 2014-10-08 DIAGNOSIS — Z8619 Personal history of other infectious and parasitic diseases: Secondary | ICD-10-CM | POA: Insufficient documentation

## 2014-10-08 DIAGNOSIS — Z79899 Other long term (current) drug therapy: Secondary | ICD-10-CM | POA: Diagnosis not present

## 2014-10-08 DIAGNOSIS — Y288XXA Contact with other sharp object, undetermined intent, initial encounter: Secondary | ICD-10-CM | POA: Diagnosis not present

## 2014-10-08 DIAGNOSIS — Y9389 Activity, other specified: Secondary | ICD-10-CM | POA: Insufficient documentation

## 2014-10-08 DIAGNOSIS — Z87891 Personal history of nicotine dependence: Secondary | ICD-10-CM | POA: Diagnosis not present

## 2014-10-08 DIAGNOSIS — K219 Gastro-esophageal reflux disease without esophagitis: Secondary | ICD-10-CM | POA: Insufficient documentation

## 2014-10-08 DIAGNOSIS — Z23 Encounter for immunization: Secondary | ICD-10-CM | POA: Insufficient documentation

## 2014-10-08 DIAGNOSIS — Y998 Other external cause status: Secondary | ICD-10-CM | POA: Insufficient documentation

## 2014-10-08 DIAGNOSIS — Z8781 Personal history of (healed) traumatic fracture: Secondary | ICD-10-CM | POA: Diagnosis not present

## 2014-10-08 DIAGNOSIS — S61431A Puncture wound without foreign body of right hand, initial encounter: Secondary | ICD-10-CM | POA: Insufficient documentation

## 2014-10-08 DIAGNOSIS — Y9289 Other specified places as the place of occurrence of the external cause: Secondary | ICD-10-CM | POA: Insufficient documentation

## 2014-10-08 MED ORDER — TETANUS-DIPHTH-ACELL PERTUSSIS 5-2.5-18.5 LF-MCG/0.5 IM SUSP
0.5000 mL | Freq: Once | INTRAMUSCULAR | Status: AC
  Administered 2014-10-08: 0.5 mL via INTRAMUSCULAR
  Filled 2014-10-08: qty 0.5

## 2014-10-08 MED ORDER — CEPHALEXIN 500 MG PO CAPS: 500.0000 mg | ORAL_CAPSULE | Freq: Three times a day (TID) | ORAL | Status: DC

## 2014-10-08 NOTE — Discharge Instructions (Signed)
Read the information below.  Use the prescribed medication as directed.  Please discuss all new medications with your pharmacist.  You may return to the Emergency Department at any time for worsening condition or any new symptoms that concern you.    If you develop redness, swelling, pus draining from the wound, or fevers greater than 100.4, return to the ER immediately for a recheck.     Puncture Wound A puncture wound is an injury that extends through all layers of the skin and into the tissue beneath the skin (subcutaneous tissue). Puncture wounds become infected easily because germs often enter the body and go beneath the skin during the injury. Having a deep wound with a small entrance point makes it difficult for your caregiver to adequately clean the wound. This is especially true if you have stepped on a nail and it has passed through a dirty shoe or other situations where the wound is obviously contaminated. CAUSES  Many puncture wounds involve glass, nails, splinters, fish hooks, or other objects that enter the skin (foreign bodies). A puncture wound may also be caused by a human bite or animal bite. DIAGNOSIS  A puncture wound is usually diagnosed by your history and a physical exam. You may need to have an X-ray or an ultrasound to check for any foreign bodies still in the wound. TREATMENT   Your caregiver will clean the wound as thoroughly as possible. Depending on the location of the wound, a bandage (dressing) may be applied.  Your caregiver might prescribe antibiotic medicines.  You may need a follow-up visit to check on your wound. Follow all instructions as directed by your caregiver. HOME CARE INSTRUCTIONS   Change your dressing once per day, or as directed by your caregiver. If the dressing sticks, it may be removed by soaking the area in water.  If your caregiver has given you follow-up instructions, it is very important that you return for a follow-up appointment. Not  following up as directed could result in a chronic or permanent injury, pain, and disability.  Only take over-the-counter or prescription medicines for pain, discomfort, or fever as directed by your caregiver.  If you are given antibiotics, take them as directed. Finish them even if you start to feel better. You may need a tetanus shot if:  You cannot remember when you had your last tetanus shot.  You have never had a tetanus shot. If you got a tetanus shot, your arm may swell, get red, and feel warm to the touch. This is common and not a problem. If you need a tetanus shot and you choose not to have one, there is a rare chance of getting tetanus. Sickness from tetanus can be serious. You may need a rabies shot if an animal bite caused your puncture wound. SEEK MEDICAL CARE IF:   You have redness, swelling, or increasing pain in the wound.  You have red streaks going away from the wound.  You notice a bad smell coming from the wound or dressing.  You have yellowish-white fluid (pus) coming from the wound.  You are treated with an antibiotic for infection, but the infection is not getting better.  You notice something in the wound, such as rubber from your shoe, cloth, or another object.  You have a fever.  You have severe pain.  You have difficulty breathing.  You feel dizzy or faint.  You cannot stop vomiting.  You lose feeling, develop numbness, or cannot move a limb below  the wound.  Your symptoms worsen. MAKE SURE YOU:  Understand these instructions.  Will watch your condition.  Will get help right away if you are not doing well or get worse. Document Released: 07/02/2005 Document Revised: 12/15/2011 Document Reviewed: 03/11/2011 [facility] Patient Information 2015 [place], [place]. This information is not intended to replace advice given to you by your health care provider. Make sure you discuss any questions you have with your health care provider.

## 2014-10-08 NOTE — ED Notes (Signed)
Pt from home c/o hand injury where his lateral side of palm was punctured by a piece of metal. Bleeding controlled small bandage covering wound.

## 2014-10-08 NOTE — ED Provider Notes (Signed)
CSN: [id number]     Arrival date & time 10/08/14  1617 History   First MD Initiated Contact with Patient 10/08/14 1833     This chart was scribed for non-physician practitioner working with Pamella Pert, MD by Forrestine Him, ED Scribe. This patient was seen in room WTR8/WTR8 and the patient's care was started at 7:06 PM.   Chief Complaint  Patient presents with  . Hand Injury   The history is provided by the patient. No language interpreter was used.    HPI Comments: Michael Lindsey is a 64 y.o. male R handed dominant who presents to the Emergency Department here for a hand injury today. Pt states he accidentally punctured the ulnar aspect of his palm with a piece of sharp metal. He estimates the metal piece went into his skin 1/8 inch. [name] rinsed the wound properly and applied rubbing alcohol to the area after the incident. Pt believes no pieces of metal remain lodged in his skin. He unaware of last Tetanus shot. No known allergies to medications.  Past Medical History  Diagnosis Date  . Hepatitis A 1972    from exposure to septic tank which malfunctioned  . GERD (gastroesophageal reflux disease)   . Osteoporosis   . Fracture     L arm @ 10; 3 ribs with shoulder separation & PTX 2003  . Allergy    Past Surgical History  Procedure Laterality Date  . Hernia repair    . Upper gastrointestinal endoscopy  2003 & 2008     H pylori 2008  . Colonoscopy  2011    Dr Sharlett Iles  . Skin cancer excision  2011    R calf, Dr Jarome Matin   Family History  Problem Relation Age of Onset  . Depression Mother   . Tuberculosis Mother   . Coronary artery disease Father   . Emphysema Father   . Colon cancer Father   . Depression Brother   . Alcohol abuse Paternal Uncle   . Diabetes Neg Hx   . Stroke Neg Hx    History  Substance Use Topics  . Smoking status: Former Smoker    Quit date: 10/06/1980  . Smokeless tobacco: Not on file     Comment: intermittent smoker before quitting,  never > 2 mos @ a time  . Alcohol Use: 0.0 oz/week     Comment: SOCIALLY, < 1-2 glasses/ week    Review of Systems  Constitutional: Negative for fever.  Skin: Positive for wound. Negative for color change.  Allergic/Immunologic: Negative for immunocompromised state.  Neurological: Negative for weakness and numbness.  Hematological: Does not bruise/bleed easily.  Psychiatric/Behavioral: Positive for self-injury (accidental).      Allergies  Chlorine  Home Medications   Prior to Admission medications   Medication Sig Start Date End Date Taking? Authorizing Provider  acetaminophen (TYLENOL) 325 MG tablet Take 650 mg by mouth every 6 (six) hours as needed.   Yes Historical Provider, MD  Ascorbic Acid (VITAMIN C) 100 MG tablet Take 100 mg by mouth daily.   Yes Historical Provider, MD  cholecalciferol (VITAMIN D) 1000 UNITS tablet Take 1,000 Units by mouth daily.   Yes Historical Provider, MD  Flaxseed, Linseed, (FLAXSEED OIL) 1000 MG CAPS Take by mouth.   Yes Historical Provider, MD  Nepafenac (ILEVRO OP) Apply 1 drop to eye daily.   Yes Historical Provider, MD  ranitidine (ZANTAC) 150 MG tablet Take 150 mg by mouth as needed for heartburn.   Yes  Historical Provider, MD  thiamine (VITAMIN B-1) 100 MG tablet Take 100 mg by mouth daily.   Yes Historical Provider, MD  triamcinolone ointment (KENALOG) 0.1 % Apply 1 application topically 2 (two) times daily. 11/03/13   Midge Minium, MD   Triage Vitals: BP 125/74 mmHg  Pulse 73  Temp(Src) 97.4 F (36.3 C) (Oral)  Resp 18  SpO2 98%   Physical Exam  Constitutional: He appears well-developed and well-nourished. No distress.  HENT:  Head: Normocephalic and atraumatic.  Neck: Neck supple.  Pulmonary/Chest: Effort normal.  Musculoskeletal:       Right hand: He exhibits laceration. He exhibits normal range of motion, no tenderness, no bony tenderness, normal capillary refill, no deformity and no swelling. Normal sensation noted.  Normal strength noted.       Hands: Neurological: He is alert.  Skin: He is not diaphoretic.  Nursing note and vitals reviewed.   ED Course  Procedures (including critical care time)  DIAGNOSTIC STUDIES: Oxygen Saturation is 98% on RA, Normal by my interpretation.    COORDINATION OF CARE: 7:08 PM- Will give Boostrix. Discussed treatment plan with pt at bedside and pt agreed to plan.       Labs Review Labs Reviewed - No data to display  Imaging Review No results found.   EKG Interpretation None      MDM   Final diagnoses:  Puncture wound, hand, right, initial encounter   Afebrile, nontoxic patient with tiny puncture wound of right palm.  Pt is right handed.  Tetanus updated. Pt declined xray, is sure there is no possibility of FB in the wound.   D/C home with keflex, PCP follow up.  Discussed wound care.  Discussed result, findings, treatment, and follow up  with patient.  Pt given return precautions.  Pt verbalizes understanding and agrees with plan.       I personally performed the services described in this documentation, which was scribed in my presence. The recorded information has been reviewed and is accurate.    Clayton Bibles, PA-C 10/08/14 1911  Pamella Pert, MD 10/08/14 [phone]

## 2014-10-12 ENCOUNTER — Ambulatory Visit (INDEPENDENT_AMBULATORY_CARE_PROVIDER_SITE_OTHER): Payer: BLUE CROSS/BLUE SHIELD | Admitting: Internal Medicine

## 2014-10-12 ENCOUNTER — Encounter: Payer: Self-pay | Admitting: Internal Medicine

## 2014-10-12 VITALS — BP 92/70 | HR 85 | Temp 98.1°F | Ht 72.0 in | Wt 172.8 lb

## 2014-10-12 DIAGNOSIS — T148XXA Other injury of unspecified body region, initial encounter: Secondary | ICD-10-CM

## 2014-10-12 DIAGNOSIS — T148 Other injury of unspecified body region: Secondary | ICD-10-CM

## 2014-10-12 NOTE — Patient Instructions (Signed)
Please report warning signs as we discussed. Worrisome would be change in color or size, increased pain, fever, or pus production.

## 2014-10-12 NOTE — Progress Notes (Signed)
   Subjective:    Patient ID: Michael Lindsey, male    DOB: 12-04-1950, 64 y.o.   MRN: [id number]  HPI   He had a puncture wound of the right lateral palm 10/08/14 by a metal strip while working in his house. The metal strip was not rusted and had no signs of mold  He was given a tetanus shot in emergency room and Keflex.    Review of Systems He denies fever, chills, sweats  There's been no signs of cellulitis at the palm and no purulence. He denies any significant tenderness at this site.  He has full use of the right hand.  Blood pressure is low in the context of his training for the [employer]. He swims 3 times per week for 90 minutes and also lifts weights 2-3 times a week. He is asymptomatic @ this high level of CVE. Marland Kitchen    Objective:   Physical Exam  There is a minor , closed punctate lesion at the right lateral palm in one of the creases.  There is no associated cellulitis or purulence.  There is no tenderness to palpation.   He has full range of motion of the right hand.  Radial and ulnar pulses are excellent.  He has no epitrochlear, axillary, or cervical lymph nodes.      Assessment & Plan:  #1 puncture wound without signs of secondary cellulitis or abscess  Plan: Warning signs discussed. He will complete the oral antibiotics.

## 2014-10-12 NOTE — Progress Notes (Signed)
Pre visit review using our clinic review tool, if applicable. No additional management support is needed unless otherwise documented below in the visit note. 

## 2014-10-30 ENCOUNTER — Telehealth: Payer: Self-pay | Admitting: Gastroenterology

## 2014-10-30 ENCOUNTER — Encounter: Payer: Self-pay | Admitting: *Deleted

## 2014-10-30 ENCOUNTER — Ambulatory Visit: Payer: BLUE CROSS/BLUE SHIELD | Admitting: Physician Assistant

## 2014-10-30 NOTE — Telephone Encounter (Signed)
Previous Sharlett Iles pt that states he has been having problems with reflux and congestion. Would like to be seen and thinks he may need an EGD. Pt scheduled to see Amy Esterwood PA today at 3pm. Pt aware of appt.

## 2014-11-13 ENCOUNTER — Other Ambulatory Visit: Payer: Self-pay | Admitting: Internal Medicine

## 2014-11-13 ENCOUNTER — Encounter: Payer: Self-pay | Admitting: Internal Medicine

## 2014-11-13 ENCOUNTER — Ambulatory Visit (INDEPENDENT_AMBULATORY_CARE_PROVIDER_SITE_OTHER)
Admission: RE | Admit: 2014-11-13 | Discharge: 2014-11-13 | Disposition: A | Discharge status: Home / Self Care | Payer: BLUE CROSS/BLUE SHIELD | Source: Ambulatory Visit | Attending: Internal Medicine | Admitting: Internal Medicine

## 2014-11-13 ENCOUNTER — Ambulatory Visit (INDEPENDENT_AMBULATORY_CARE_PROVIDER_SITE_OTHER): Payer: BLUE CROSS/BLUE SHIELD | Admitting: Internal Medicine

## 2014-11-13 ENCOUNTER — Other Ambulatory Visit (INDEPENDENT_AMBULATORY_CARE_PROVIDER_SITE_OTHER): Payer: BLUE CROSS/BLUE SHIELD

## 2014-11-13 VITALS — BP 110/80 | HR 56 | Temp 97.8°F | Ht 72.0 in | Wt 165.0 lb

## 2014-11-13 DIAGNOSIS — Z0000 Encounter for general adult medical examination without abnormal findings: Secondary | ICD-10-CM

## 2014-11-13 DIAGNOSIS — M8580 Other specified disorders of bone density and structure, unspecified site: Secondary | ICD-10-CM

## 2014-11-13 DIAGNOSIS — Z0189 Encounter for other specified special examinations: Secondary | ICD-10-CM

## 2014-11-13 DIAGNOSIS — R1310 Dysphagia, unspecified: Secondary | ICD-10-CM

## 2014-11-13 LAB — CBC WITH DIFFERENTIAL/PLATELET
Basophils Absolute: 0 10*3/uL (ref 0.0–0.1)
Basophils Relative: 0.3 % (ref 0.0–3.0)
Eosinophils Absolute: 0.2 10*3/uL (ref 0.0–0.7)
Eosinophils Relative: 4.2 % (ref 0.0–5.0)
HEMATOCRIT: 45.3 % (ref 39.0–52.0)
Hemoglobin: 15.7 g/dL (ref 13.0–17.0)
LYMPHS PCT: 23.3 % (ref 12.0–46.0)
Lymphs Abs: 1.4 10*3/uL (ref 0.7–4.0)
MCHC: 34.7 g/dL (ref 30.0–36.0)
MCV: 95.2 fl (ref 78.0–100.0)
MONO ABS: 0.5 10*3/uL (ref 0.1–1.0)
Monocytes Relative: 9.1 % (ref 3.0–12.0)
Neutro Abs: 3.7 10*3/uL (ref 1.4–7.7)
Neutrophils Relative %: 63.1 % (ref 43.0–77.0)
Platelets: 200 10*3/uL (ref 150.0–400.0)
RBC: 4.76 Mil/uL (ref 4.22–5.81)
RDW: 13.4 % (ref 11.5–15.5)
WBC: 5.9 10*3/uL (ref 4.0–10.5)

## 2014-11-13 LAB — LIPID PANEL
CHOLESTEROL: 141 mg/dL (ref 0–200)
HDL: 57.5 mg/dL (ref >39.00)
LDL Cholesterol: 72 mg/dL (ref 0–99)
NonHDL: 83.5
TRIGLYCERIDES: 58 mg/dL (ref 0.0–149.0)
Total CHOL/HDL Ratio: 2
VLDL: 11.6 mg/dL (ref 0.0–40.0)

## 2014-11-13 LAB — HEPATIC FUNCTION PANEL
ALK PHOS: 60 U/L (ref 39–117)
ALT: 14 U/L (ref 0–53)
AST: 19 U/L (ref 0–37)
Albumin: 4 g/dL (ref 3.5–5.2)
Bilirubin, Direct: 0.1 mg/dL (ref 0.0–0.3)
TOTAL PROTEIN: 6.7 g/dL (ref 6.0–8.3)
Total Bilirubin: 0.7 mg/dL (ref 0.2–1.2)

## 2014-11-13 LAB — TSH: TSH: 2.13 u[IU]/mL (ref 0.35–4.50)

## 2014-11-13 LAB — BASIC METABOLIC PANEL
BUN: 17 mg/dL (ref 6–23)
CALCIUM: 9 mg/dL (ref 8.4–10.5)
CO2: 28 meq/L (ref 19–32)
CREATININE: 0.87 mg/dL (ref 0.40–1.50)
Chloride: 105 mEq/L (ref 96–112)
GFR: 93.67 mL/min (ref >60.00)
GLUCOSE: 88 mg/dL (ref 70–99)
Potassium: 4.3 mEq/L (ref 3.5–5.1)
SODIUM: 139 meq/L (ref 135–145)

## 2014-11-13 LAB — TESTOSTERONE: TESTOSTERONE: 584.61 ng/dL (ref 300.00–890.00)

## 2014-11-13 NOTE — Assessment & Plan Note (Signed)
To see Dr Cristina Gong

## 2014-11-13 NOTE — Progress Notes (Signed)
   Subjective:    Patient ID: Michael Lindsey, male    DOB: September 08, 1951, 64 y.o.   MRN: [id number]  HPI  He is here for a physical;acute issues include recurrent dysphagia.   He had upper endoscopy 2003 & 2008. He had esophagitis in 2003 and H. pylori in 2008. He will be seeing Dr.Buccini in the 11/29/14 for follow-up. Colonoscopy was completed in 2011.  Review of Systems  This has been intermittent in nature.  The symptoms had been manifested as intermittent hoarseness and dysphagia. He has lost 5 pounds during this recently.  He has identified triggers which include tight garments; positional changes while swimming (flip turns); alcohol intake; late meals and meals w/o his drinking liquids. Additionally he believes a natural remedy he was using including apple juice, vinegar, honey may have been a factor..  With eliminating these ,symptoms have been much better.     Objective:   Physical Exam  Gen.: Thin but aequately nourished in appearance. Alert, appropriate and cooperative throughout exam.  Appears younger than stated age  Head: Normocephalic without obvious abnormalities;  pattern alopecia  Eyes: No corneal or conjunctival inflammation noted. Pupils equal round reactive to light and accommodation. Extraocular motion intact.  Ears: External  ear exam reveals no significant lesions or deformities. Canals clear .TMs normal. Hearing is grossly normal bilaterally. Nose: External nasal exam reveals no deformity or inflammation. Nasal mucosa are pink and moist. No lesions or exudates noted.   Mouth: Oral mucosa and oropharynx reveal no lesions or exudates. Teeth in good repair. Neck: No deformities, masses, or tenderness noted. Range of motion decreeased. Thyroid normal. Lungs: Normal respiratory effort; chest expands symmetrically. Lungs are clear to auscultation without rales, wheezes, or increased work of breathing. Heart: Slow rate and regular rhythm. Normal S1 and S2. No gallop,  click, or rub. No murmur. Abdomen: Bowel sounds normal; abdomen soft and nontender. No masses, organomegaly or hernias noted. Genitalia: Genitalia normal except for left varices & small granuloma. Prostate is normal without enlargement, asymmetry, nodularity, or induration                          Musculoskeletal/extremities: No deformity or scoliosis noted of  the thoracic or lumbar spine.  No clubbing, cyanosis, edema, or significant extremity  deformity noted.  Range of motion normal . Tone & strength normal. Hand joints normal Fingernail  health good. Able to lie down & sit up w/o help.  Negative SLR bilaterally Vascular: Carotid, radial artery, dorsalis pedis and  posterior tibial pulses are equal. Slightly decreased DPP. No bruits present. Neurologic: Alert and oriented x3. Deep tendon reflexes symmetrical and normal.  Gait normal      Skin: Intact without suspicious lesions or rashes. Lymph: No cervical, axillary, or inguinal lymphadenopathy present. Psych: Mood and affect are normal. Normally interactive                                                                                      Assessment & Plan:  #1 comprehensive physical exam; no acute findings  Plan: see Orders  & Recommendations

## 2014-11-13 NOTE — Patient Instructions (Signed)
Your next office appointment will be determined based upon review of your pending labs . Those instructions will be transmitted to you through My Chart. Critical values will be called    Followup as needed for your acute issue. Please report any significant change in your symptoms.  Reflux of gastric acid may be asymptomatic as this may occur mainly during sleep.The triggers for reflux  include stress; the "aspirin family" ; alcohol; peppermint; and caffeine (coffee, tea, cola, and chocolate). The aspirin family would include aspirin and the nonsteroidal agents such as ibuprofen &  Naproxen. Tylenol would not cause reflux. If having symptoms ; food & drink should be avoided for @ least 2 hours before going to bed.

## 2014-11-13 NOTE — Progress Notes (Signed)
Pre visit review using our clinic review tool, if applicable. No additional management support is needed unless otherwise documented below in the visit note. 

## 2014-11-24 ENCOUNTER — Encounter: Payer: BLUE CROSS/BLUE SHIELD | Admitting: Internal Medicine

## 2014-11-30 ENCOUNTER — Other Ambulatory Visit: Payer: BLUE CROSS/BLUE SHIELD

## 2014-11-30 ENCOUNTER — Encounter: Payer: BLUE CROSS/BLUE SHIELD | Admitting: Internal Medicine

## 2015-03-21 DIAGNOSIS — L821 Other seborrheic keratosis: Secondary | ICD-10-CM | POA: Diagnosis not present

## 2015-03-21 DIAGNOSIS — Z85828 Personal history of other malignant neoplasm of skin: Secondary | ICD-10-CM | POA: Diagnosis not present

## 2015-03-21 DIAGNOSIS — L814 Other melanin hyperpigmentation: Secondary | ICD-10-CM | POA: Diagnosis not present

## 2015-03-21 DIAGNOSIS — D1801 Hemangioma of skin and subcutaneous tissue: Secondary | ICD-10-CM | POA: Diagnosis not present

## 2015-03-21 DIAGNOSIS — L820 Inflamed seborrheic keratosis: Secondary | ICD-10-CM | POA: Diagnosis not present

## 2015-03-21 DIAGNOSIS — D2271 Melanocytic nevi of right lower limb, including hip: Secondary | ICD-10-CM | POA: Diagnosis not present

## 2015-04-02 ENCOUNTER — Other Ambulatory Visit: Payer: Self-pay

## 2015-07-29 DIAGNOSIS — Z23 Encounter for immunization: Secondary | ICD-10-CM | POA: Diagnosis not present

## 2015-08-21 DIAGNOSIS — D485 Neoplasm of uncertain behavior of skin: Secondary | ICD-10-CM | POA: Diagnosis not present

## 2015-08-21 DIAGNOSIS — Z85828 Personal history of other malignant neoplasm of skin: Secondary | ICD-10-CM | POA: Diagnosis not present

## 2015-08-21 DIAGNOSIS — L235 Allergic contact dermatitis due to other chemical products: Secondary | ICD-10-CM | POA: Diagnosis not present

## 2015-08-21 DIAGNOSIS — L570 Actinic keratosis: Secondary | ICD-10-CM | POA: Diagnosis not present

## 2015-09-07 DIAGNOSIS — Z85828 Personal history of other malignant neoplasm of skin: Secondary | ICD-10-CM | POA: Diagnosis not present

## 2015-09-07 DIAGNOSIS — L308 Other specified dermatitis: Secondary | ICD-10-CM | POA: Diagnosis not present

## 2015-09-14 ENCOUNTER — Encounter: Payer: BLUE CROSS/BLUE SHIELD | Admitting: Internal Medicine

## 2015-09-24 ENCOUNTER — Encounter: Payer: Self-pay | Admitting: Internal Medicine

## 2015-09-24 ENCOUNTER — Other Ambulatory Visit (INDEPENDENT_AMBULATORY_CARE_PROVIDER_SITE_OTHER): Payer: Medicare Other

## 2015-09-24 ENCOUNTER — Ambulatory Visit (INDEPENDENT_AMBULATORY_CARE_PROVIDER_SITE_OTHER): Payer: Medicare Other | Admitting: Internal Medicine

## 2015-09-24 VITALS — BP 124/82 | HR 55 | Temp 97.8°F | Resp 20 | Ht 72.0 in | Wt 169.5 lb

## 2015-09-24 DIAGNOSIS — K219 Gastro-esophageal reflux disease without esophagitis: Secondary | ICD-10-CM

## 2015-09-24 DIAGNOSIS — R531 Weakness: Secondary | ICD-10-CM | POA: Diagnosis not present

## 2015-09-24 DIAGNOSIS — D179 Benign lipomatous neoplasm, unspecified: Secondary | ICD-10-CM

## 2015-09-24 DIAGNOSIS — M8580 Other specified disorders of bone density and structure, unspecified site: Secondary | ICD-10-CM

## 2015-09-24 DIAGNOSIS — N429 Disorder of prostate, unspecified: Secondary | ICD-10-CM

## 2015-09-24 DIAGNOSIS — R6882 Decreased libido: Secondary | ICD-10-CM

## 2015-09-24 LAB — CBC WITH DIFFERENTIAL/PLATELET
BASOS PCT: 0.3 % (ref 0.0–3.0)
Basophils Absolute: 0 10*3/uL (ref 0.0–0.1)
Eosinophils Absolute: 0.2 10*3/uL (ref 0.0–0.7)
Eosinophils Relative: 3.4 % (ref 0.0–5.0)
HCT: 46.6 % (ref 39.0–52.0)
Hemoglobin: 15.8 g/dL (ref 13.0–17.0)
LYMPHS PCT: 21.3 % (ref 12.0–46.0)
Lymphs Abs: 1.4 10*3/uL (ref 0.7–4.0)
MCHC: 33.9 g/dL (ref 30.0–36.0)
MCV: 97 fl (ref 78.0–100.0)
Monocytes Absolute: 0.6 10*3/uL (ref 0.1–1.0)
Monocytes Relative: 9.9 % (ref 3.0–12.0)
NEUTROS ABS: 4.1 10*3/uL (ref 1.4–7.7)
Neutrophils Relative %: 65.1 % (ref 43.0–77.0)
Platelets: 208 10*3/uL (ref 150.0–400.0)
RBC: 4.81 Mil/uL (ref 4.22–5.81)
RDW: 13.4 % (ref 11.5–15.5)
WBC: 6.4 10*3/uL (ref 4.0–10.5)

## 2015-09-24 LAB — VITAMIN D 25 HYDROXY (VIT D DEFICIENCY, FRACTURES): VITD: 40.45 ng/mL (ref 30.00–100.00)

## 2015-09-24 LAB — BASIC METABOLIC PANEL
BUN: 17 mg/dL (ref 6–23)
CO2: 29 meq/L (ref 19–32)
CREATININE: 0.95 mg/dL (ref 0.40–1.50)
Calcium: 9.2 mg/dL (ref 8.4–10.5)
Chloride: 106 mEq/L (ref 96–112)
GFR: 84.4 mL/min (ref >60.00)
Glucose, Bld: 97 mg/dL (ref 70–99)
Potassium: 4.5 mEq/L (ref 3.5–5.1)
Sodium: 141 mEq/L (ref 135–145)

## 2015-09-24 LAB — TSH: TSH: 1.76 u[IU]/mL (ref 0.35–4.50)

## 2015-09-24 LAB — TESTOSTERONE: TESTOSTERONE: 572.34 ng/dL (ref 300.00–890.00)

## 2015-09-24 LAB — PSA: PSA: 0.38 ng/mL (ref 0.10–4.00)

## 2015-09-24 NOTE — Assessment & Plan Note (Addendum)
BMD every 25 months BMET Vitamin D level

## 2015-09-24 NOTE — Patient Instructions (Signed)
Reflux of gastric acid may be asymptomatic as this may occur mainly during sleep.The triggers for reflux  include stress; the "aspirin family" ; alcohol; peppermint; and caffeine (coffee, tea, cola, and chocolate). The aspirin family would include aspirin and the nonsteroidal agents such as ibuprofen &  Naproxen. Tylenol would not cause reflux. If having symptoms ; food & drink should be avoided for @ least 2 hours before going to bed.    Your next office appointment will be determined based upon review of your pending labs  and  xrays  Those written interpretation of the lab results and instructions will be transmitted to you by My Chart  Critical results will be called.   Followup as needed for any active or acute issue. Please report any significant change in your symptoms. 

## 2015-09-24 NOTE — Progress Notes (Signed)
Pre visit review using our clinic review tool, if applicable. No additional management support is needed unless otherwise documented below in the visit note. 

## 2015-09-24 NOTE — Assessment & Plan Note (Signed)
CBC and differential  Antireflux measures

## 2015-09-24 NOTE — Progress Notes (Signed)
   Subjective:    Patient ID: Michael Lindsey, male    DOB: Dec 30, 1950, 64 y.o.   MRN: [id number]  HPI The patient is here to assess status of active health conditions.  Acute concerns include a lesion over the right upper extremity which has increased in size. Also he describes decreased libido as well as muscle weakness, particularly after a prolonged workout.  PMH, FH, & Social History reviewed & updated.No change in [place] as recorded.  He is on a heart healthy diet. He will exercise 30-90 minutes as weights or swimming 6 days a week. He denies cardiopulmonary symptoms with exercise but as noted is profoundly tired after the 90 minute workout.  Colonoscopy is up-to-date; he has no GI symptoms. His father did have colon cancer.  In addition to the above symptoms he does have nocturia 1.  Review of Systems  Chest pain, palpitations, tachycardia, exertional dyspnea, paroxysmal nocturnal dyspnea, claudication or edema are absent. No unexplained weight loss, abdominal pain, significant dyspepsia, dysphagia, melena, rectal bleeding, or persistently small caliber stools. Dysuria, pyuria, hematuria, frequency, or polyuria are denied. Change in hair, skin, nails denied. No bowel changes of constipation or diarrhea. No intolerance to heat or cold.     Objective:   Physical Exam  Pertinent or positive findings include: Slight pattern alopecia is present. There is decreased range of motion of cervical spine. Testicular exam is normal. The prostate is asymmetric. The left is small and the right is within normal limits. There is no nodularity or induration. He has a 24 x 21 mm lipoma over the right upper extremity which transilluminates and which is not attached to subcutaneous tissues.  General appearance :adequately nourished; in no distress.  Eyes: No conjunctival inflammation or scleral icterus is present.  Oral exam:  Lips and gums are healthy appearing.There is no oropharyngeal erythema or  exudate noted. Dental hygiene is good.  Heart:  Normal rate and regular rhythm. S1 and S2 normal without gallop, murmur, click, rub or other extra sounds    Lungs:Chest clear to auscultation; no wheezes, rhonchi,rales ,or rubs present.No increased work of breathing.   Abdomen: bowel sounds normal, soft and non-tender without masses, organomegaly or hernias noted.  No guarding or rebound.   Vascular : all pulses equal ; no bruits present.  Skin:Warm & dry.  Intact without suspicious lesions or rashes ; no tenting or jaundice   Lymphatic: No lymphadenopathy is noted about the head, neck, axilla, or inguinal areas.   Neuro: Strength, tone & DTRs normal.     Assessment & Plan:  #1 See Current Assessment & Plan in Problem List under specific Diagnosis  #2 decreased libido  #3 muscle weakness  #4 lipoma  See orders recommendations

## 2015-09-26 DIAGNOSIS — L570 Actinic keratosis: Secondary | ICD-10-CM | POA: Diagnosis not present

## 2015-09-26 DIAGNOSIS — Z85828 Personal history of other malignant neoplasm of skin: Secondary | ICD-10-CM | POA: Diagnosis not present

## 2016-04-15 DIAGNOSIS — Z85828 Personal history of other malignant neoplasm of skin: Secondary | ICD-10-CM | POA: Diagnosis not present

## 2016-04-15 DIAGNOSIS — D2271 Melanocytic nevi of right lower limb, including hip: Secondary | ICD-10-CM | POA: Diagnosis not present

## 2016-04-15 DIAGNOSIS — L814 Other melanin hyperpigmentation: Secondary | ICD-10-CM | POA: Diagnosis not present

## 2016-04-15 DIAGNOSIS — L821 Other seborrheic keratosis: Secondary | ICD-10-CM | POA: Diagnosis not present

## 2016-04-15 DIAGNOSIS — D1801 Hemangioma of skin and subcutaneous tissue: Secondary | ICD-10-CM | POA: Diagnosis not present

## 2016-04-15 DIAGNOSIS — L570 Actinic keratosis: Secondary | ICD-10-CM | POA: Diagnosis not present

## 2016-04-15 DIAGNOSIS — B351 Tinea unguium: Secondary | ICD-10-CM | POA: Diagnosis not present

## 2016-05-03 ENCOUNTER — Encounter: Payer: Self-pay | Admitting: Family Medicine

## 2016-05-03 ENCOUNTER — Ambulatory Visit (INDEPENDENT_AMBULATORY_CARE_PROVIDER_SITE_OTHER): Payer: Medicare Other | Admitting: Family Medicine

## 2016-05-03 VITALS — BP 122/84 | HR 73 | Temp 98.3°F | Resp 14 | Ht 72.0 in | Wt 167.0 lb

## 2016-05-03 DIAGNOSIS — J018 Other acute sinusitis: Secondary | ICD-10-CM

## 2016-05-03 MED ORDER — AMOXICILLIN 875 MG PO TABS: 875.0000 mg | ORAL_TABLET | Freq: Two times a day (BID) | ORAL | 0 refills | Status: AC

## 2016-05-03 NOTE — Patient Instructions (Signed)
Continue claritin and mucinex. Add saline nasal spray (generic, otc).

## 2016-05-03 NOTE — Progress Notes (Signed)
OFFICE VISIT  05/03/2016   CC:  Chief Complaint  Patient presents with  . Sinus Problem    stuffy and runny nose, pain at bridge of nose, headache, pressure in face x Monday      HPI:    Patient is a 65 y.o.  male who presents for URI sx's. Onset 5-6 d/a, nasal and sinus congestion, maxillary sinus and ethmoid sinus region pain ---left sided more initially, then turned diffuse.  Denies PND.  HA from pressure.  No ST.  Occ cough.  No wheezing.  No fever. No ear complaints.  Claritin and mucinex helped some.  Past Medical History:  Diagnosis Date  . Allergy   . Fracture    L arm @ 10; 3 ribs with shoulder separation & PTX 2003  . Gastritis 09/13/2007   EGD  . GERD (gastroesophageal reflux disease)   . Hepatitis A 1972   from exposure to septic tank which malfunctioned  . Internal hemorrhoids 09/13/2007   COLON  . Osteoporosis     Past Surgical History:  Procedure Laterality Date  . cataract surgery  10/2014  . COLONOSCOPY  2011   Dr Sharlett Iles  . HERNIA REPAIR    . SKIN CANCER EXCISION  2011   R calf, Dr Jarome Matin  . UPPER GASTROINTESTINAL ENDOSCOPY  2003 & 2008    H pylori 2008    Outpatient Medications Prior to Visit  Medication Sig Dispense Refill  . acetaminophen (TYLENOL) 325 MG tablet Take 650 mg by mouth every 6 (six) hours as needed.    . Ascorbic Acid (VITAMIN C) 100 MG tablet Take 100 mg by mouth daily.    . cholecalciferol (VITAMIN D) 1000 UNITS tablet Take 1,000 Units by mouth daily.    . Flaxseed, Linseed, (FLAXSEED OIL) 1000 MG CAPS Take by mouth.    . ranitidine (ZANTAC) 150 MG tablet Take 150 mg by mouth as needed for heartburn.    . thiamine (VITAMIN B-1) 100 MG tablet Take 100 mg by mouth daily.     No facility-administered medications prior to visit.     Allergies  Allergen Reactions  . Chlorine     EYES WATER/NASAL CONGESTION    ROS As per HPI  PE: Blood pressure 122/84, pulse 73, temperature 98.3 F (36.8 C), temperature source  Oral, resp. rate 14, height 6' (1.829 m), weight 167 lb (75.8 kg), SpO2 96 %.  VS: noted--normal. Gen: alert, NAD, NONTOXIC APPEARING. HEENT: eyes without injection, drainage, or swelling.  Ears: EACs clear, TMs with normal light reflex and landmarks.  Nose: Clear rhinorrhea, with some dried, crusty exudate adherent to mildly injected mucosa.  No purulent d/c.  Bilat ethmoid sinus region TTP.   No facial swelling.  Throat and mouth without focal lesion.  No pharyngial swelling, erythema, or exudate.   Neck: supple, no LAD.   LUNGS: CTA bilat, nonlabored resps.   CV: RRR, no m/r/g. EXT: no c/c/e SKIN: no rash  LABS:  none  IMPRESSION AND PLAN:  Acute ethmoid sinusitis. Recommended amoxil 875mg  bid x 10d + saline nasal spray for nose irrigation. Contine claritin and mucinex.  An After Visit Summary was printed and given to the patient.   FOLLOW UP: No Follow-up on file.  Signed:  Crissie Sickles, MD           05/03/2016

## 2016-05-04 NOTE — Progress Notes (Signed)
OK, done. Hope you are doing well. --Phil.

## 2016-07-08 DIAGNOSIS — D692 Other nonthrombocytopenic purpura: Secondary | ICD-10-CM | POA: Diagnosis not present

## 2016-07-08 DIAGNOSIS — L821 Other seborrheic keratosis: Secondary | ICD-10-CM | POA: Diagnosis not present

## 2016-07-08 DIAGNOSIS — Z85828 Personal history of other malignant neoplasm of skin: Secondary | ICD-10-CM | POA: Diagnosis not present

## 2016-07-17 ENCOUNTER — Ambulatory Visit (INDEPENDENT_AMBULATORY_CARE_PROVIDER_SITE_OTHER): Payer: Self-pay | Admitting: Orthopaedic Surgery

## 2016-07-18 DIAGNOSIS — Z23 Encounter for immunization: Secondary | ICD-10-CM | POA: Diagnosis not present

## 2016-09-06 DIAGNOSIS — J019 Acute sinusitis, unspecified: Secondary | ICD-10-CM | POA: Diagnosis not present

## 2016-09-11 ENCOUNTER — Ambulatory Visit (INDEPENDENT_AMBULATORY_CARE_PROVIDER_SITE_OTHER): Payer: Self-pay

## 2016-09-11 ENCOUNTER — Ambulatory Visit (INDEPENDENT_AMBULATORY_CARE_PROVIDER_SITE_OTHER): Payer: Medicare Other | Admitting: Orthopaedic Surgery

## 2016-09-11 ENCOUNTER — Ambulatory Visit (INDEPENDENT_AMBULATORY_CARE_PROVIDER_SITE_OTHER): Payer: Medicare Other

## 2016-09-11 ENCOUNTER — Encounter (INDEPENDENT_AMBULATORY_CARE_PROVIDER_SITE_OTHER): Payer: Self-pay | Admitting: Orthopaedic Surgery

## 2016-09-11 VITALS — BP 106/67 | HR 72 | Ht 72.0 in | Wt 165.0 lb

## 2016-09-11 DIAGNOSIS — G8929 Other chronic pain: Secondary | ICD-10-CM

## 2016-09-11 DIAGNOSIS — M25561 Pain in right knee: Secondary | ICD-10-CM

## 2016-09-11 DIAGNOSIS — M79671 Pain in right foot: Secondary | ICD-10-CM

## 2016-09-11 DIAGNOSIS — M25562 Pain in left knee: Secondary | ICD-10-CM

## 2016-09-11 DIAGNOSIS — M25511 Pain in right shoulder: Secondary | ICD-10-CM

## 2016-09-11 MED ORDER — BUPIVACAINE HCL 0.5 % IJ SOLN
2.0000 mL | INTRAMUSCULAR | Status: AC | PRN
  Administered 2016-09-11: 2 mL via INTRA_ARTICULAR

## 2016-09-11 MED ORDER — LIDOCAINE HCL 1 % IJ SOLN
2.0000 mL | INTRAMUSCULAR | Status: AC | PRN
  Administered 2016-09-11: 2 mL

## 2016-09-11 MED ORDER — METHYLPREDNISOLONE ACETATE 40 MG/ML IJ SUSP
80.0000 mg | INTRAMUSCULAR | Status: AC | PRN
  Administered 2016-09-11: 80 mg

## 2016-09-11 NOTE — Progress Notes (Signed)
Office Visit Note   Patient: Michael Lindsey           Date of Birth: Jul 20, 1951           MRN: [id number] Visit Date: 09/11/2016              Requested by: No referring provider defined for this encounter. PCP: No primary care provider on file.   Assessment & Plan: Visit Diagnoses: Minimal osteoarthritis medial compartment of both knees. We will see him back on a when necessary basis in terms of injection we had a long discussion about the pathology and treatment options. Mr. Mcrorie is a history of reflux and is hasn't taken nonsteroidal anti-inflammatory medicines.  Right shoulder exam is consistent with impingement. There may be a small rotator cuff tear. I will inject that shoulder and monitor his response.  Plan: f/u prn  Follow-Up Instructions: No Follow-up on file.   Orders:  No orders of the defined types were placed in this encounter.  No orders of the defined types were placed in this encounter.     Procedures: Large Joint Inj Date/Time: 09/11/2016 5:00 PM Performed by: Garald Balding Authorized by: Garald Balding   Consent Given by:  Patient Timeout: prior to procedure the correct patient, procedure, and site was verified   Indications:  Pain Location:  Shoulder Site:  L subacromial bursa Prep: patient was prepped and draped in usual sterile fashion   Needle Size:  25 G Needle Length:  1.5 inches Approach:  Lateral Ultrasound Guidance: No   Fluoroscopic Guidance: No   Arthrogram: No   Medications:  80 mg methylPREDNISolone acetate 40 MG/ML; 2 mL lidocaine 1 %; 2 mL bupivacaine 0.5 % Aspiration Attempted: No   Patient tolerance:  Patient tolerated the procedure well with no immediate complications     Clinical Data: No additional findings.   Subjective: Chief Complaint  Patient presents with  . Right Knee - Pain    Pt had Left knee arth on 12/2013. Pt playing platform tennis and having BIL  Knee pain and a rightpain  on his right  foot  Pt is a swimmer and was using paddles to swim and felt his shoulders "pull" on a downward motion.  He has not been in the water in a month    Review of Systems   Objective: Vital Signs: There were no vitals taken for this visit.  Physical Exam  Ortho Exam examination of the right shoulder revealed positive impingement and empty can testing. There was full overhead motion but with a painful arc. Biceps was intact neurovascular exam was intact.   Exam of both knees reveal minimal medial joint discomfort. No loss of motion. No effusion. No evidence of instability. No swelling distally. Neurovascular exam was intact. Some mild decrease of external rotation of the right hip compared to the left but no referred pain.  Exam of the right foot demonstrated a little bit of tenderness at the base the fifth metatarsal consistent with a peroneal tendinitis. No pain over the metatarsals.  Specialty Comments:  No specialty comments available.  Imaging: No results found.   PMFS History: Patient Active Problem List   Diagnosis Date Noted  . Eczema 11/03/2013  . Allergic rhinitis 06/01/2013  . Skin cancer 08/11/2012  . Dysphagia 12/08/2007  . Osteopenia 09/14/2007  . Esophageal reflux 04/13/2007   Past Medical History:  Diagnosis Date  . Allergy   . Fracture    L arm @ 10; 3  ribs with shoulder separation & PTX 2003  . Gastritis 09/13/2007   EGD  . GERD (gastroesophageal reflux disease)   . Hepatitis A 1972   from exposure to septic tank which malfunctioned  . Internal hemorrhoids 09/13/2007   COLON  . Osteoporosis     Family History  Problem Relation Age of Onset  . Depression Mother   . Tuberculosis Mother   . Coronary artery disease Father   . Emphysema Father   . Colon cancer Father 18  . Depression Brother   . Alcohol abuse Paternal Uncle   . Diabetes Neg Hx   . Stroke Neg Hx     Past Surgical History:  Procedure Laterality Date  . cataract surgery  10/2014   . COLONOSCOPY  2011   Dr Sharlett Iles  . HERNIA REPAIR    . SKIN CANCER EXCISION  2011   R calf, Dr Jarome Matin  . UPPER GASTROINTESTINAL ENDOSCOPY  2003 & 2008    H pylori 2008   Social History   Occupational History  . IMMIGRATION ATTORNEY    Social History Main Topics  . Smoking status: Former Smoker    Quit date: 10/06/1980  . Smokeless tobacco: Not on file     Comment: intermittent smoker over 4 years in college & again in 1983 before quitting, never > 2 mos @ a time,never > 1/2 ppd  . Alcohol use No     Comment: SOCIALLY, < 1-2 glasses/ week previously ; none since 1/16 due to GERD  . Drug use: No  . Sexual activity: Not on file

## 2016-09-15 ENCOUNTER — Ambulatory Visit (INDEPENDENT_AMBULATORY_CARE_PROVIDER_SITE_OTHER): Payer: Self-pay | Admitting: Orthopaedic Surgery

## 2016-09-23 ENCOUNTER — Telehealth: Payer: Self-pay | Admitting: Internal Medicine

## 2016-09-23 NOTE — Telephone Encounter (Signed)
°  Patient Name: Michael Lindsey  DOB: 1951/03/02    Initial Comment Caller states he has congestion in his chest, having shortness of breath.   Nurse Assessment  Nurse: Julien Girt, RN, Almyra Free Date/Time Eilene Ghazi Time): 09/23/2016 10:00:20 AM  Confirm and document reason for call. If symptomatic, describe symptoms. ---Caller states he has had a cough for the last 3 weeks. He was seen at an UC on 09/06/2016, given abx for an URI and his sx did improve. He still had the cough, but this past weekend his sx worsened. This morning he felt sob and feels weak. No fever. Denies difficulty breathing or sob at this time.  Does the patient have any new or worsening symptoms? ---Yes  Will a triage be completed? ---Yes  Related visit to physician within the last 2 weeks? ---No  Does the PT have any chronic conditions? (i.e. diabetes, asthma, etc.) ---No  Is this a behavioral health or substance abuse call? ---No     Guidelines    Guideline Title Affirmed Question Affirmed Notes  Cough - Acute Productive [1] Continuous (nonstop) coughing interferes with work or school AND [2] no improvement using cough treatment per Care Advice    Final Disposition User   See Physician within 24 Hours Julien Girt, RN, [employer]    Referrals  REFERRED TO PCP OFFICE   Disagree/Comply: Leta Baptist

## 2016-09-24 ENCOUNTER — Telehealth: Payer: Self-pay | Admitting: Emergency Medicine

## 2016-09-24 ENCOUNTER — Ambulatory Visit (INDEPENDENT_AMBULATORY_CARE_PROVIDER_SITE_OTHER)
Admission: RE | Admit: 2016-09-24 | Discharge: 2016-09-24 | Disposition: A | Discharge status: Home / Self Care | Payer: Medicare Other | Source: Ambulatory Visit | Attending: Internal Medicine | Admitting: Internal Medicine

## 2016-09-24 ENCOUNTER — Other Ambulatory Visit (INDEPENDENT_AMBULATORY_CARE_PROVIDER_SITE_OTHER): Payer: Medicare Other

## 2016-09-24 ENCOUNTER — Ambulatory Visit (INDEPENDENT_AMBULATORY_CARE_PROVIDER_SITE_OTHER): Payer: Medicare Other | Admitting: Internal Medicine

## 2016-09-24 ENCOUNTER — Encounter: Payer: Self-pay | Admitting: Internal Medicine

## 2016-09-24 VITALS — BP 120/78 | HR 67 | Resp 20 | Wt 168.0 lb

## 2016-09-24 DIAGNOSIS — J439 Emphysema, unspecified: Secondary | ICD-10-CM | POA: Diagnosis not present

## 2016-09-24 DIAGNOSIS — R5383 Other fatigue: Secondary | ICD-10-CM

## 2016-09-24 DIAGNOSIS — R002 Palpitations: Secondary | ICD-10-CM

## 2016-09-24 DIAGNOSIS — R0600 Dyspnea, unspecified: Secondary | ICD-10-CM

## 2016-09-24 LAB — BASIC METABOLIC PANEL
BUN: 13 mg/dL (ref 6–23)
CALCIUM: 9.4 mg/dL (ref 8.4–10.5)
CHLORIDE: 102 meq/L (ref 96–112)
CO2: 31 meq/L (ref 19–32)
CREATININE: 0.86 mg/dL (ref 0.40–1.50)
GFR: 94.39 mL/min (ref >60.00)
GLUCOSE: 90 mg/dL (ref 70–99)
Potassium: 4.3 mEq/L (ref 3.5–5.1)
SODIUM: 138 meq/L (ref 135–145)

## 2016-09-24 LAB — CBC WITH DIFFERENTIAL/PLATELET
BASOS ABS: 0 10*3/uL (ref 0.0–0.1)
Basophils Relative: 0.5 % (ref 0.0–3.0)
EOS ABS: 0.3 10*3/uL (ref 0.0–0.7)
Eosinophils Relative: 3.3 % (ref 0.0–5.0)
HCT: 44.7 % (ref 39.0–52.0)
Hemoglobin: 15.4 g/dL (ref 13.0–17.0)
LYMPHS ABS: 1.4 10*3/uL (ref 0.7–4.0)
Lymphocytes Relative: 17.7 % (ref 12.0–46.0)
MCHC: 34.4 g/dL (ref 30.0–36.0)
MCV: 95.2 fl (ref 78.0–100.0)
Monocytes Absolute: 0.6 10*3/uL (ref 0.1–1.0)
Monocytes Relative: 7.9 % (ref 3.0–12.0)
NEUTROS ABS: 5.5 10*3/uL (ref 1.4–7.7)
NEUTROS PCT: 70.6 % (ref 43.0–77.0)
PLATELETS: 239 10*3/uL (ref 150.0–400.0)
RBC: 4.7 Mil/uL (ref 4.22–5.81)
RDW: 13.4 % (ref 11.5–15.5)
WBC: 7.8 10*3/uL (ref 4.0–10.5)

## 2016-09-24 LAB — HEPATIC FUNCTION PANEL
ALBUMIN: 4 g/dL (ref 3.5–5.2)
ALK PHOS: 60 U/L (ref 39–117)
ALT: 15 U/L (ref 0–53)
AST: 18 U/L (ref 0–37)
BILIRUBIN DIRECT: 0.1 mg/dL (ref 0.0–0.3)
TOTAL PROTEIN: 6.9 g/dL (ref 6.0–8.3)
Total Bilirubin: 0.6 mg/dL (ref 0.2–1.2)

## 2016-09-24 LAB — URINALYSIS, ROUTINE W REFLEX MICROSCOPIC
BILIRUBIN URINE: NEGATIVE
Hgb urine dipstick: NEGATIVE
KETONES UR: NEGATIVE
LEUKOCYTES UA: NEGATIVE
Nitrite: NEGATIVE
PH: 7 (ref 5.0–8.0)
Specific Gravity, Urine: <=1.005 — AB (ref 1.000–1.030)
TOTAL PROTEIN, URINE-UPE24: NEGATIVE
URINE GLUCOSE: NEGATIVE
UROBILINOGEN UA: 0.2 (ref 0.0–1.0)

## 2016-09-24 LAB — TSH: TSH: 2.07 u[IU]/mL (ref 0.35–4.50)

## 2016-09-24 MED ORDER — ASPIRIN EC 81 MG PO TBEC: 81.0000 mg | DELAYED_RELEASE_TABLET | Freq: Every day | ORAL | 11 refills | Status: DC

## 2016-09-24 NOTE — Progress Notes (Signed)
Pre visit review using our clinic review tool, if applicable. No additional management support is needed unless otherwise documented below in the visit note. 

## 2016-09-24 NOTE — Telephone Encounter (Signed)
Pt called and asked if he can get a referral put in to see a cardiologist due to shortness of breathe. He also asked if his xrays can be sent over to the cardiologist as well. He stated he wants to have this done ASAP. Please advise thanks.

## 2016-09-24 NOTE — Progress Notes (Signed)
Subjective:    Patient ID: Michael Lindsey, male    DOB: 1951/07/25, 65 y.o.   MRN: [id number]  HPI  Here to f/u with wife and pt very concerned about single episode of palpitations x 2-3 min 2 days ago without recurrent. Pt denies chest pain, increased sob or doe, wheezing, orthopnea, PND, increased LE swelling, other palpitations, dizziness or syncope.  Normally very active so any mild symptoms are very unusual, and felt weak, could not finish his usual exercise in last 2 days.  Had recent URI, tx dec 7 with augmentin course.   Pt denies fever, wt loss, night sweats, loss of appetite, or other constitutional symptoms. Pt denies new neurological symptoms such as new headache, or facial or extremity weakness or numbness   Pt denies polydipsia, polyuria,  Currently not taking asa 81 but will start Past Medical History:  Diagnosis Date  . Allergy   . Fracture    L arm @ 10; 3 ribs with shoulder separation & PTX 2003  . Gastritis 09/13/2007   EGD  . GERD (gastroesophageal reflux disease)   . Hepatitis A 1972   from exposure to septic tank which malfunctioned  . Internal hemorrhoids 09/13/2007   COLON  . Osteoporosis    Past Surgical History:  Procedure Laterality Date  . cataract surgery  10/2014  . COLONOSCOPY  2011   Dr Sharlett Iles  . HERNIA REPAIR    . SKIN CANCER EXCISION  2011   R calf, Dr Jarome Matin  . UPPER GASTROINTESTINAL ENDOSCOPY  2003 & 2008    H pylori 2008    reports that he quit smoking about 36 years ago. He does not have any smokeless tobacco history on file. He reports that he does not drink alcohol or use drugs. family history includes Alcohol abuse in his paternal uncle; Colon cancer (age of onset: 25) in his father; Coronary artery disease in his father; Depression in his brother and mother; Emphysema in his father; Tuberculosis in his mother. Allergies  Allergen Reactions  . Chlorine     EYES WATER/NASAL CONGESTION   Current Outpatient Prescriptions on File  Prior to Visit  Medication Sig Dispense Refill  . acetaminophen (TYLENOL) 325 MG tablet Take 650 mg by mouth every 6 (six) hours as needed.    . Ascorbic Acid (VITAMIN C) 100 MG tablet Take 100 mg by mouth daily.    . cholecalciferol (VITAMIN D) 1000 UNITS tablet Take 1,000 Units by mouth daily.    . Flaxseed, Linseed, (FLAXSEED OIL) 1000 MG CAPS Take by mouth.    . ranitidine (ZANTAC) 150 MG tablet Take 150 mg by mouth as needed for heartburn.    . thiamine (VITAMIN B-1) 100 MG tablet Take 100 mg by mouth daily.    . vitamin E 100 UNIT capsule Take by mouth daily.     No current facility-administered medications on file prior to visit.    Review of Systems  Constitutional: Negative for unusual diaphoresis or night sweats HENT: Negative for ear swelling or discharge Eyes: Negative for worsening visual haziness  Respiratory: Negative for choking and stridor.   Gastrointestinal: Negative for distension or worsening eructation Genitourinary: Negative for retention or change in urine volume.  Musculoskeletal: Negative for other MSK pain or swelling Skin: Negative for color change and worsening wound Neurological: Negative for tremors and numbness other than noted  Psychiatric/Behavioral: Negative for decreased concentration or agitation other than above   All other system neg per pt  Objective:   Physical Exam BP 120/78   Pulse 67   Resp 20   Wt 168 lb (76.2 kg)   SpO2 98%   BMI 22.78 kg/m  VS noted, not ill appearing Constitutional: Pt appears in no apparent distress HENT: Head: NCAT.  Right Ear: External ear normal.  Left Ear: External ear normal.  Eyes: . Pupils are equal, round, and reactive to light. Conjunctivae and EOM are normal Bilat tm's with mild erythema.  Max sinus areas non tender.  Pharynx with mild erythema, no exudate Neck: Normal range of motion. Neck supple.  Cardiovascular: Normal rate and regular rhythm.   Pulmonary/Chest: Effort normal and breath sounds  without rales or wheezing.  Abd:  Soft, NT, ND, + BS Neurological: Pt is alert. Not confused , motor grossly intact Skin: Skin is warm. No rash, no LE edema Psychiatric: Pt behavior is normal. No agitation. stoic and tense demeanor  ECG today I have personally interpreted Sinus  Rhythm  -poor R wave progression, consider old anterior infarct, otherwise non specific    Assessment & Plan:

## 2016-09-24 NOTE — Telephone Encounter (Signed)
Referral done  All xray, ECG,  and labs are on EPIC and would be available for cardiology review as well

## 2016-09-24 NOTE — Patient Instructions (Addendum)
Your EKG was OK today  Please take all new medication as recommended - the Aspirin 81 mg - (enteric coated so as not to hurt the stomach)   Please continue all other medications as before  Please have the pharmacy call with any other refills you may need.  You will be contacted regarding the referral for: Echocardiogram, and Cardiac Event Monitor  Please go to the XRAY Department in the Basement (go straight as you get off the elevator) for the x-ray testing  Please go to the LAB in the Basement (turn left off the elevator) for the tests to be done today  You will be contacted by phone if any changes need to be made immediately.  Otherwise, you will receive a letter about your results with an explanation, but please check with MyChart first.  Please call if you would like referral to Cardiology  Please otherwise keep your appointments with any specialists as you may have planned  Please return in 3 months, or sooner if needed to Dr Quay Burow

## 2016-09-25 ENCOUNTER — Ambulatory Visit (INDEPENDENT_AMBULATORY_CARE_PROVIDER_SITE_OTHER): Payer: Medicare Other

## 2016-09-25 DIAGNOSIS — R002 Palpitations: Secondary | ICD-10-CM

## 2016-09-25 DIAGNOSIS — R0600 Dyspnea, unspecified: Secondary | ICD-10-CM | POA: Diagnosis not present

## 2016-09-25 DIAGNOSIS — R5383 Other fatigue: Secondary | ICD-10-CM

## 2016-09-26 ENCOUNTER — Ambulatory Visit (INDEPENDENT_AMBULATORY_CARE_PROVIDER_SITE_OTHER): Payer: Medicare Other | Admitting: Interventional Cardiology

## 2016-09-26 ENCOUNTER — Other Ambulatory Visit: Payer: Self-pay

## 2016-09-26 ENCOUNTER — Encounter: Payer: Self-pay | Admitting: Interventional Cardiology

## 2016-09-26 ENCOUNTER — Ambulatory Visit (HOSPITAL_COMMUNITY): Payer: Medicare Other | Attending: Cardiology

## 2016-09-26 VITALS — BP 102/64 | HR 76 | Ht 72.0 in | Wt 170.0 lb

## 2016-09-26 DIAGNOSIS — R0600 Dyspnea, unspecified: Secondary | ICD-10-CM | POA: Diagnosis not present

## 2016-09-26 DIAGNOSIS — R5383 Other fatigue: Secondary | ICD-10-CM

## 2016-09-26 DIAGNOSIS — Q676 Pectus excavatum: Secondary | ICD-10-CM | POA: Insufficient documentation

## 2016-09-26 DIAGNOSIS — R0609 Other forms of dyspnea: Secondary | ICD-10-CM

## 2016-09-26 DIAGNOSIS — Z87891 Personal history of nicotine dependence: Secondary | ICD-10-CM | POA: Insufficient documentation

## 2016-09-26 DIAGNOSIS — R002 Palpitations: Secondary | ICD-10-CM

## 2016-09-26 LAB — ECHOCARDIOGRAM COMPLETE
HEIGHTINCHES: 72 in
Weight: 2720 oz

## 2016-09-26 NOTE — Patient Instructions (Signed)
Medication Instructions:  The current medical regimen is effective;  continue present plan and medications.  Follow-Up: Follow up as needed.  Thank you for choosing [facility]!!     

## 2016-09-26 NOTE — Progress Notes (Signed)
Cardiology Office Note   Date:  09/26/2016   ID:  Michael Lindsey, DOB Apr 06, 1951, MRN [id number]  PCP:  No primary care provider on file.    No chief complaint on file. palpitations   Wt Readings from Last 3 Encounters:  09/26/16 170 lb (77.1 kg)  09/24/16 168 lb (76.2 kg)  09/11/16 165 lb (74.8 kg)       History of Present Illness: Michael Lindsey is a 65 y.o. male  Who has had rare palpitations in the past over the past few years.    Over the past month, he had some a cough and cold.  He noted some shortlived palpitations, about 1-2 minutes.  He did a brief workout on the elliptical.  No lightheadedness or syncope with the palpitations.    No prior cardiac testing.    He typically exercises.  He swims twice a week, and swam in [employer].  He does weights, golf, biking.  Knee pain is his only limitation.    Since the last episode of palpitations, he has felt fine.  No problems with exercise.   He does not drink much caffeine.     Past Medical History:  Diagnosis Date  . Allergy   . Fracture    L arm @ 10; 3 ribs with shoulder separation & PTX 2003  . Gastritis 09/13/2007   EGD  . GERD (gastroesophageal reflux disease)   . Hepatitis A 1972   from exposure to septic tank which malfunctioned  . Internal hemorrhoids 09/13/2007   COLON  . Osteoporosis     Past Surgical History:  Procedure Laterality Date  . cataract surgery  10/2014  . COLONOSCOPY  2011   Dr Sharlett Iles  . HERNIA REPAIR    . SKIN CANCER EXCISION  2011   R calf, Dr Jarome Matin  . UPPER GASTROINTESTINAL ENDOSCOPY  2003 & 2008    H pylori 2008     Current Outpatient Prescriptions  Medication Sig Dispense Refill  . acetaminophen (TYLENOL) 325 MG tablet Take 650 mg by mouth every 6 (six) hours as needed.    . Ascorbic Acid (VITAMIN C) 100 MG tablet Take 100 mg by mouth daily.    Marland Kitchen aspirin EC 81 MG tablet Take 1 tablet (81 mg total) by mouth daily. 90 tablet 11  .  cholecalciferol (VITAMIN D) 1000 UNITS tablet Take 1,000 Units by mouth daily.    . Flaxseed, Linseed, (FLAXSEED OIL) 1000 MG CAPS Take by mouth.    . ranitidine (ZANTAC) 150 MG tablet Take 150 mg by mouth as needed for heartburn.    . thiamine (VITAMIN B-1) 100 MG tablet Take 100 mg by mouth daily.    . vitamin E 100 UNIT capsule Take by mouth daily.     No current facility-administered medications for this visit.     Allergies:   Chlorine    Social History:  The patient  reports that he quit smoking about 35 years ago. He does not have any smokeless tobacco history on file. He reports that he does not drink alcohol or use drugs.   Family History:  The patient's family history includes Alcohol abuse in his paternal uncle; Colon cancer (age of onset: 41) in his father; Coronary artery disease in his father; Depression in his brother and mother; Emphysema in his father; Tuberculosis in his mother.    ROS:  Please see the history of present illness.   Otherwise, review of systems are positive  for recent palpitaitons.   All other systems are reviewed and negative.    PHYSICAL EXAM: VS:  BP 102/64 (BP Location: Left Arm)   Pulse 76   Ht 6' (1.829 m)   Wt 170 lb (77.1 kg)   BMI 23.06 kg/m  , BMI Body mass index is 23.06 kg/m. GEN: Well nourished, well developed, in no acute distress  HEENT: normal  Neck: no JVD, carotid bruits, or masses Cardiac: RRR; no murmurs, rubs, or gallops,no edema  Respiratory:  clear to auscultation bilaterally, normal work of breathing GI: soft, nontender, nondistended, + BS MS: no deformity or atrophy  Skin: warm and dry, no rash Neuro:  Strength and sensation are intact Psych: euthymic mood, full affect   EKG:   The ekg ordered today demonstrates Normal ECG   Recent Labs: 09/24/2016: ALT 15; BUN 13; Creatinine, Ser 0.86; Hemoglobin 15.4; Platelets 239.0; Potassium 4.3; Sodium 138; TSH 2.07   Lipid Panel    Component Value Date/Time   CHOL 141  11/13/2014 1137   TRIG 58.0 11/13/2014 1137   HDL 57.50 11/13/2014 1137   CHOLHDL 2 11/13/2014 1137   VLDL 11.6 11/13/2014 1137   LDLCALC 72 11/13/2014 1137     Other studies Reviewed: Additional studies/ records that were reviewed today with results demonstrating: OV with Dr. Jenny Reichmann.   ASSESSMENT AND PLAN:  1. Palpitations: no syncope or lightheadedness.  I think they will be benign. He is wearing a monitor.  Excellent exercise tolerance.  Doubt this is a ventricular arrhythmia.  Will followup on monitor results.  2. Normal exam. Doubt structural heart disease.  I looked at the echo.  No obvious structural abnormalities.  Normal LVEF and valvular function.   Await official read. OK for him to travel as planned.  3. Discussed continuing healthy lifestyle.     Current medicines are reviewed at length with the patient today.  The patient concerns regarding his medicines were addressed.  The following changes have been made:  No change  Labs/ tests ordered today include:  No orders of the defined types were placed in this encounter.   Recommend 150 minutes/week of aerobic exercise Low fat, low carb, high fiber diet recommended  Disposition:   FU prn   Signed, Larae Grooms, MD  09/26/2016 2:33 PM    [facility] [address], [place],   [address] Phone: [phone]; Fax: [phone]

## 2016-09-28 NOTE — Assessment & Plan Note (Signed)
I suspect post infectious fatigue to naturally resolve in the next 1-2 wks, for labs o/w documented,  to f/u any worsening symptoms or concerns

## 2016-09-28 NOTE — Assessment & Plan Note (Signed)
Benign exam but unsual for him, for cxr,  to f/u any worsening symptoms or concerns

## 2016-09-28 NOTE — Assessment & Plan Note (Signed)
ecg reviewed, cant r/o svt or afib episode new onset, for monitor, echo, and cardiology referral to be consdiered per pt

## 2016-10-03 ENCOUNTER — Telehealth: Payer: Self-pay | Admitting: Internal Medicine

## 2016-10-03 NOTE — Telephone Encounter (Signed)
Pt called stating Dr. Linna Darner recommending Dr. Quay Burow to be his PCP. Request for [place] to ask Dr. Quay Burow. Please advise.

## 2016-10-07 NOTE — Telephone Encounter (Signed)
Let him know I can not accept him at this time.  He has an appt with Marya Amsler - reassure him how good Marya Amsler is and that Dr Linna Darner also highly recommended Marya Amsler.

## 2016-10-07 NOTE — Telephone Encounter (Signed)
Left vm for patient

## 2016-10-14 ENCOUNTER — Other Ambulatory Visit (HOSPITAL_COMMUNITY): Payer: Medicare Other

## 2016-10-17 ENCOUNTER — Ambulatory Visit: Payer: Medicare Other | Admitting: Family

## 2016-10-20 ENCOUNTER — Encounter: Payer: Self-pay | Admitting: Family

## 2016-10-20 ENCOUNTER — Other Ambulatory Visit (INDEPENDENT_AMBULATORY_CARE_PROVIDER_SITE_OTHER): Payer: Medicare Other

## 2016-10-20 ENCOUNTER — Ambulatory Visit (INDEPENDENT_AMBULATORY_CARE_PROVIDER_SITE_OTHER): Payer: Medicare Other | Admitting: Family

## 2016-10-20 VITALS — BP 114/80 | HR 61 | Temp 97.7°F | Resp 16 | Ht 72.0 in | Wt 178.0 lb

## 2016-10-20 DIAGNOSIS — Z125 Encounter for screening for malignant neoplasm of prostate: Secondary | ICD-10-CM | POA: Diagnosis not present

## 2016-10-20 DIAGNOSIS — K219 Gastro-esophageal reflux disease without esophagitis: Secondary | ICD-10-CM

## 2016-10-20 DIAGNOSIS — Z0000 Encounter for general adult medical examination without abnormal findings: Secondary | ICD-10-CM | POA: Diagnosis not present

## 2016-10-20 DIAGNOSIS — Z136 Encounter for screening for cardiovascular disorders: Secondary | ICD-10-CM

## 2016-10-20 DIAGNOSIS — Z1211 Encounter for screening for malignant neoplasm of colon: Secondary | ICD-10-CM

## 2016-10-20 DIAGNOSIS — Z23 Encounter for immunization: Secondary | ICD-10-CM | POA: Diagnosis not present

## 2016-10-20 DIAGNOSIS — Z0001 Encounter for general adult medical examination with abnormal findings: Secondary | ICD-10-CM | POA: Insufficient documentation

## 2016-10-20 DIAGNOSIS — M8589 Other specified disorders of bone density and structure, multiple sites: Secondary | ICD-10-CM

## 2016-10-20 LAB — LIPID PANEL
CHOL/HDL RATIO: 2
Cholesterol: 150 mg/dL (ref 0–200)
HDL: 64.1 mg/dL (ref >39.00)
LDL Cholesterol: 72 mg/dL (ref 0–99)
NONHDL: 86.07
Triglycerides: 70 mg/dL (ref 0.0–149.0)
VLDL: 14 mg/dL (ref 0.0–40.0)

## 2016-10-20 LAB — PSA, MEDICARE: PSA: 0.52 ng/mL (ref 0.10–4.00)

## 2016-10-20 NOTE — Assessment & Plan Note (Signed)
Stable and currently managed with over-the-counter ranitidine as needed. Continue with GERD-related diet and follow-up as needed.

## 2016-10-20 NOTE — Assessment & Plan Note (Signed)
Reviewed and updated patient's medical, surgical, family and social history. Medications and allergies were also reviewed. Basic screenings for depression, activities of daily living, hearing, cognition and safety were performed. Provider list was updated and health plan was provided to the patient.   Prevnar updated today. Schedule for Pneumovax discussed. All other immunizations are up-to-date per recommendations. Due for colon cancer screening with referral placed to gastroenterology. Obtain PSA for prostate cancer screening. Due for bone marrow density check with order for DEXA placed. All other screenings are up-to-date per recommendations.  Overall well exam with minimal risk factors for cardiovascular disease. He eats well and exercises regularly. He is currently working on increasing his physical activity back to prior levels. His goals are to enjoy life at this time. Continue healthy lifestyle behaviors and choices. Follow-up prevention exam in 1 year. Follow-up office visit pending blood work if necessary.

## 2016-10-20 NOTE — Assessment & Plan Note (Signed)
Previously diagnosed with osteopenia and currently maintained on vitamin D. Obtain DEXA. Continue current dosage of vitamin D pending DEXA results.

## 2016-10-20 NOTE — Progress Notes (Signed)
Subjective:    Patient ID: Michael Lindsey, male    DOB: 1951/07/13, 66 y.o.   MRN: [id number]  Chief Complaint  Patient presents with  . Establish Care    CPE     HPI:  Michael Lindsey is a 66 y.o. male who presents today for a Medicare Annual Wellness/Physical exam.    1) Health Maintenance -   Diet - Averaging about 3 meals per day consisting of a regular diet; Caffeine intake is rare.   Exercise - Generally about 2-3 times per week with resistance training and cardiovascular.   2) Preventative Exams / Immunizations:  Dental -- Up to date  Vision -- Up to date   Health Maintenance  Topic Date Due  . PNA vac Low Risk Adult (1 of 2 - PCV13) 02/11/2015  . COLONOSCOPY  09/20/2015  . TETANUS/TDAP  10/08/2024  . INFLUENZA VACCINE  Completed  . ZOSTAVAX  Completed  . Hepatitis C Screening  Completed     Immunization History  Administered Date(s) Administered  . Influenza Split 07/10/2011  . Influenza Whole 07/06/2002, 08/06/2012, 06/20/2013  . Influenza-Unspecified 08/05/2015  . Pneumococcal Conjugate-13 10/20/2016  . Td 05/23/2002  . Tdap 08/12/2013, 10/08/2014  . Zoster 07/06/2012    RISK FACTORS  Tobacco History  Smoking Status  . Former Smoker  . Quit date: 10/06/1980  Smokeless Tobacco  . Never Used    Comment: intermittent smoker over 4 years in college & again in 1983 before quitting, never > 2 mos @ a time,never > 1/2 ppd     Cardiac risk factors: advanced age (older than 1 for men, 55 for women) and male gender.  Depression Screen  Depression screen [name] 2/9 10/20/2016  Decreased Interest 0  Down, Depressed, Hopeless 0  PHQ - 2 Score 0     Activities of Daily Living In your present state of health, do you have any difficulty performing the following activities?:  Driving? No Managing money?  No Feeding yourself? No Getting from bed to chair? No Climbing a flight of stairs? No Preparing food and eating?: No Bathing or showering?  No Getting dressed: No Getting to the toilet? No Using the toilet: No Moving around from place to place: No In the past year have you fallen or had a near fall?:No   Home Safety Has smoke detector and wears seat belts. No excess sun exposure. Are there smokers in your home (other than you)?  No Do you feel safe at home?  Yes  Hearing Difficulties: No Do you often ask people to speak up or repeat themselves? No Do you experience ringing or noises in your ears? No  Do you have difficulty understanding soft or whispered voices? No    Cognitive Testing  Alert? Yes   Normal Appearance? Yes  Oriented to person? Yes  Place? Yes   Time? Yes  Recall of three objects?  Yes  Can perform simple calculations? Yes  Displays appropriate judgment? Yes  Can read the correct time from a watch face? Yes  Do you feel that you have a problem with memory? No  Do you often misplace items? No   Advanced Directives have been discussed with the patient? Yes   Current Physicians/Providers and Suppliers  1. Terri Piedra, FNP - Internal Medicine 2. Larae Grooms, MD - Cardiology 3. Joni Fears, MD - Orthopedics  Indicate any recent Medical Services you may have received from other than [facility] providers in the past year (date may be  approximate).  All answers were reviewed with the patient and necessary referrals were made:  [name], [place]   10/20/2016    Allergies  Allergen Reactions  . Chlorine     EYES WATER/NASAL CONGESTION     Outpatient Medications Prior to Visit  Medication Sig Dispense Refill  . acetaminophen (TYLENOL) 325 MG tablet Take 650 mg by mouth every 6 (six) hours as needed.    . Ascorbic Acid (VITAMIN C) 100 MG tablet Take 100 mg by mouth daily.    Marland Kitchen aspirin EC 81 MG tablet Take 1 tablet (81 mg total) by mouth daily. 90 tablet 11  . cholecalciferol (VITAMIN D) 1000 UNITS tablet Take 1,000 Units by mouth daily.    . Flaxseed, Linseed, (FLAXSEED OIL) 1000 MG CAPS  Take by mouth.    . ranitidine (ZANTAC) 150 MG tablet Take 150 mg by mouth as needed for heartburn.    . thiamine (VITAMIN B-1) 100 MG tablet Take 100 mg by mouth daily.    . vitamin E 100 UNIT capsule Take by mouth daily.     No facility-administered medications prior to visit.      Past Medical History:  Diagnosis Date  . Allergy   . Fracture    L arm @ 10; 3 ribs with shoulder separation & PTX 2003  . Gastritis 09/13/2007   EGD  . GERD (gastroesophageal reflux disease)   . Hepatitis A 1972   from exposure to septic tank which malfunctioned  . Internal hemorrhoids 09/13/2007   COLON  . Osteoporosis      Past Surgical History:  Procedure Laterality Date  . cataract surgery  10/2014  . COLONOSCOPY  2011   Dr Sharlett Iles  . HERNIA REPAIR    . SKIN CANCER EXCISION  2011   R calf, Dr Jarome Matin  . UPPER GASTROINTESTINAL ENDOSCOPY  2003 & 2008    H pylori 2008     Family History  Problem Relation Age of Onset  . Depression Mother   . Tuberculosis Mother   . Coronary artery disease Father   . Emphysema Father   . Colon cancer Father 17  . Depression Brother   . Alcohol abuse Paternal Uncle   . Diabetes Neg Hx   . Stroke Neg Hx      Social History   Social History  . Marital status: Married    Spouse name: N/A  . Number of children: 2  . Years of education: 48   Occupational History  . IMMIGRATION ATTORNEY    Social History Main Topics  . Smoking status: Former Smoker    Quit date: 10/06/1980  . Smokeless tobacco: Never Used     Comment: intermittent smoker over 4 years in college & again in 1983 before quitting, never > 2 mos @ a time,never > 1/2 ppd  . Alcohol use No     Comment: SOCIALLY,  . Drug use: No  . Sexual activity: Not on file   Other Topics Concern  . Not on file   Social History Narrative   GETS REG EXERCISE           Review of Systems  Constitutional: Denies fever, chills, fatigue, or significant weight gain/loss. HENT: Head:  Denies headache or neck pain Ears: Denies changes in hearing, ringing in ears, earache, drainage Nose: Denies discharge, stuffiness, itching, nosebleed, sinus pain Throat: Denies sore throat, hoarseness, dry mouth, sores, thrush Eyes: Denies loss/changes in vision, pain, redness, blurry/double vision, flashing lights Cardiovascular: Denies  chest pain/discomfort, tightness, palpitations, shortness of breath with activity, difficulty lying down, swelling, sudden awakening with shortness of breath Respiratory: Denies shortness of breath, cough, sputum production, wheezing Gastrointestinal: Denies dysphasia, heartburn, change in appetite, nausea, change in bowel habits, rectal bleeding, constipation, diarrhea, yellow skin or eyes Genitourinary: Denies frequency, urgency, burning/pain, blood in urine, incontinence, change in urinary strength. Musculoskeletal: Denies muscle/joint pain, stiffness, back pain, redness or swelling of joints, trauma Skin: Denies rashes, lumps, itching, dryness, color changes, or hair/nail changes Neurological: Denies dizziness, fainting, seizures, weakness, numbness, tingling, tremor Psychiatric - Denies nervousness, stress, depression or memory loss Endocrine: Denies heat or cold intolerance, sweating, frequent urination, excessive thirst, changes in appetite Hematologic: Denies ease of bruising or bleeding    Objective:     BP 114/80 (BP Location: Left Arm, Patient Position: Sitting, Cuff Size: Normal)   Pulse 61   Temp 97.7 F (36.5 C) (Oral)   Resp 16   Ht 6' (1.829 m)   Wt 178 lb (80.7 kg)   SpO2 98%   BMI 24.14 kg/m  Nursing note and vital signs reviewed.   Physical Exam  Constitutional: He is oriented to person, place, and time. He appears well-developed and well-nourished. No distress.  Cardiovascular: Normal rate, regular rhythm, normal heart sounds and intact distal pulses.   Pulmonary/Chest: Effort normal and breath sounds normal.  Neurological:  He is alert and oriented to person, place, and time.  Skin: Skin is warm and dry.  Psychiatric: He has a normal mood and affect. His behavior is normal. Judgment and thought content normal.       Assessment & Plan:   During the course of the visit the patient was educated and counseled about appropriate screening and preventive services including:    Pneumococcal vaccine   Influenza vaccine  Prostate cancer screening  Colorectal cancer screening  Diabetes screening  Nutrition counseling   Diet review for nutrition referral? Yes ____  Not Indicated _X___   Patient Instructions (the written plan) was given to the patient.  Medicare Attestation I have personally reviewed: The patient's medical and social history Their use of alcohol, tobacco or illicit drugs Their current medications and supplements The patient's functional ability including ADLs,fall risks, home safety risks, cognitive, and hearing and visual impairment Diet and physical activities Evidence for depression or mood disorders  The patient's weight, height, BMI,  have been recorded in the chart.  I have made referrals, counseling, and provided education to the patient based on review of the above and I have provided the patient with a written personalized care plan for preventive services.     Problem List Items Addressed This Visit      Digestive   Esophageal reflux    Stable and currently managed with over-the-counter ranitidine as needed. Continue with GERD-related diet and follow-up as needed.        Musculoskeletal and Integument   Osteopenia    Previously diagnosed with osteopenia and currently maintained on vitamin D. Obtain DEXA. Continue current dosage of vitamin D pending DEXA results.      Relevant Orders   DG Bone Density     Other   Medicare annual wellness visit, subsequent - Primary    Reviewed and updated patient's medical, surgical, family and social history. Medications and  allergies were also reviewed. Basic screenings for depression, activities of daily living, hearing, cognition and safety were performed. Provider list was updated and health plan was provided to the patient.   Prevnar updated today.  Schedule for Pneumovax discussed. All other immunizations are up-to-date per recommendations. Due for colon cancer screening with referral placed to gastroenterology. Obtain PSA for prostate cancer screening. Due for bone marrow density check with order for DEXA placed. All other screenings are up-to-date per recommendations.  Overall well exam with minimal risk factors for cardiovascular disease. He eats well and exercises regularly. He is currently working on increasing his physical activity back to prior levels. His goals are to enjoy life at this time. Continue healthy lifestyle behaviors and choices. Follow-up prevention exam in 1 year. Follow-up office visit pending blood work if necessary.        Other Visit Diagnoses    Screening for prostate cancer       Relevant Orders   PSA, Medicare (Completed)   Screening for ischemic heart disease       Relevant Orders   Lipid Profile (Completed)   Special screening for malignant neoplasm of colon       Relevant Orders   Ambulatory referral to Gastroenterology   Need for vaccination with 13-polyvalent pneumococcal conjugate vaccine       Relevant Orders   Pneumococcal conjugate vaccine 13-valent IM (Completed)       I am having Mr. Sump maintain his acetaminophen, ranitidine, vitamin C, cholecalciferol, thiamine, Flaxseed Oil, vitamin E, and aspirin EC.   Follow-up: Return in about 1 year (around 10/20/2017), or if symptoms worsen or fail to improve.   [name], FNP

## 2016-10-20 NOTE — Patient Instructions (Signed)
Thank you for choosing [facility].  SUMMARY AND INSTRUCTIONS:  Medication:  Your prescription(s) have been submitted to your pharmacy or been printed and provided for you. Please take as directed and contact our office if you believe you are having problem(s) with the medication(s) or have any questions.  Labs:  Please stop by the lab on the lower level of the building for your blood work. Your results will be released to [place] (or called to you) after review, usually within 72 hours after test completion. If any changes need to be made, you will be notified at that same time.  1.) The lab is open from 7:30am to 5:30 pm Monday-Friday 2.) No appointment is necessary 3.) Fasting (if needed) is 6-8 hours after food and drink; black coffee and water are okay   Imaging / Radiology:  Please stop by radiology on the basement level of the building for your x-rays. Your results will be released to [place] (or called to you) after review, usually within 72 hours after test completion. If any treatments or changes are necessary, you will be notified at that same time.  Follow up:  If your symptoms worsen or fail to improve, please contact our office for further instruction, or in case of emergency go directly to the emergency room at the closest medical facility.   Health Maintenance  Topic Date Due  . PNA vac Low Risk Adult (1 of 2 - PCV13) 02/11/2015  . COLONOSCOPY  09/20/2015  . TETANUS/TDAP  10/08/2024  . INFLUENZA VACCINE  Completed  . ZOSTAVAX  Completed  . Hepatitis C Screening  Completed   Health Maintenance, Male A healthy lifestyle and preventative care can promote health and wellness.  Maintain regular health, dental, and eye exams.  Eat a healthy diet. Foods like vegetables, fruits, whole grains, low-fat dairy products, and lean protein foods contain the nutrients you need and are low in calories. Decrease your intake of foods high in solid fats, added sugars, and  salt. Get information about a proper diet from your health care provider, if necessary.  Regular physical exercise is one of the most important things you can do for your health. Most adults should get at least 150 minutes of moderate-intensity exercise (any activity that increases your heart rate and causes you to sweat) each week. In addition, most adults need muscle-strengthening exercises on 2 or more days a week.   Maintain a healthy weight. The body mass index (BMI) is a screening tool to identify possible weight problems. It provides an estimate of body fat based on height and weight. Your health care provider can find your BMI and can help you achieve or maintain a healthy weight. For males 20 years and older:  A BMI below 18.5 is considered underweight.  A BMI of 18.5 to 24.9 is normal.  A BMI of 25 to 29.9 is considered overweight.  A BMI of 30 and above is considered obese.  Maintain normal blood lipids and cholesterol by exercising and minimizing your intake of saturated fat. Eat a balanced diet with plenty of fruits and vegetables. Blood tests for lipids and cholesterol should begin at age 41 and be repeated every 5 years. If your lipid or cholesterol levels are high, you are over age 24, or you are at high risk for heart disease, you may need your cholesterol levels checked more frequently.Ongoing high lipid and cholesterol levels should be treated with medicines if diet and exercise are not working.  If you smoke,  find out from your health care provider how to quit. If you do not use tobacco, do not start.  Lung cancer screening is recommended for adults aged 18-80 years who are at high risk for developing lung cancer because of a history of smoking. A yearly low-dose CT scan of the lungs is recommended for people who have at least a 30-pack-year history of smoking and are current smokers or have quit within the past 15 years. A pack year of smoking is smoking an average of 1 pack  of cigarettes a day for 1 year (for example, a 30-pack-year history of smoking could mean smoking 1 pack a day for 30 years or 2 packs a day for 15 years). Yearly screening should continue until the smoker has stopped smoking for at least 15 years. Yearly screening should be stopped for people who develop a health problem that would prevent them from having lung cancer treatment.  If you choose to drink alcohol, do not have more than 2 drinks per day. One drink is considered to be 12 oz (360 mL) of beer, 5 oz (150 mL) of wine, or 1.5 oz (45 mL) of liquor.  Avoid the use of street drugs. Do not share needles with anyone. Ask for help if you need support or instructions about stopping the use of drugs.  High blood pressure causes heart disease and increases the risk of stroke. High blood pressure is more likely to develop in:  People who have blood pressure in the end of the normal range (100-139/85-89 mm Hg).  People who are overweight or obese.  People who are African American.  If you are 54-47 years of age, have your blood pressure checked every 3-5 years. If you are 73 years of age or older, have your blood pressure checked every year. You should have your blood pressure measured twice-once when you are at a hospital or clinic, and once when you are not at a hospital or clinic. Record the average of the two measurements. To check your blood pressure when you are not at a hospital or clinic, you can use:  An automated blood pressure machine at a pharmacy.  A home blood pressure monitor.  If you are 54-75 years old, ask your health care provider if you should take aspirin to prevent heart disease.  Diabetes screening involves taking a blood sample to check your fasting blood sugar level. This should be done once every 3 years after age 38 if you are at a normal weight and without risk factors for diabetes. Testing should be considered at a younger age or be carried out more frequently if you  are overweight and have at least 1 risk factor for diabetes.  Colorectal cancer can be detected and often prevented. Most routine colorectal cancer screening begins at the age of 53 and continues through age 58. However, your health care provider may recommend screening at an earlier age if you have risk factors for colon cancer. On a yearly basis, your health care provider may provide home test kits to check for hidden blood in the stool. A small camera at the end of a tube may be used to directly examine the colon (sigmoidoscopy or colonoscopy) to detect the earliest forms of colorectal cancer. Talk to your health care provider about this at age 2 when routine screening begins. A direct exam of the colon should be repeated every 5-10 years through age 54, unless early forms of precancerous polyps or small growths are found.  People who are at an increased risk for hepatitis B should be screened for this virus. You are considered at high risk for hepatitis B if:  You were born in a country where hepatitis B occurs often. Talk with your health care provider about which countries are considered high risk.  Your parents were born in a high-risk country and you have not received a shot to protect against hepatitis B (hepatitis B vaccine).  You have HIV or AIDS.  You use needles to inject street drugs.  You live with, or have sex with, someone who has hepatitis B.  You are a man who has sex with other men (MSM).  You get hemodialysis treatment.  You take certain medicines for conditions like cancer, organ transplantation, and autoimmune conditions.  Hepatitis C blood testing is recommended for all people born from [birth year] through 1966 and any individual with known risk factors for hepatitis C.  Healthy men should no longer receive prostate-specific antigen (PSA) blood tests as part of routine cancer screening. Talk to your health care provider about prostate cancer screening.  Testicular cancer  screening is not recommended for adolescents or adult males who have no symptoms. Screening includes self-exam, a health care provider exam, and other screening tests. Consult with your health care provider about any symptoms you have or any concerns you have about testicular cancer.  Practice safe sex. Use condoms and avoid high-risk sexual practices to reduce the spread of sexually transmitted infections (STIs).  You should be screened for STIs, including gonorrhea and chlamydia if:  You are sexually active and are younger than 24 years.  You are older than 24 years, and your health care provider tells you that you are at risk for this type of infection.  Your sexual activity has changed since you were last screened, and you are at an increased risk for chlamydia or gonorrhea. Ask your health care provider if you are at risk.  If you are at risk of being infected with HIV, it is recommended that you take a prescription medicine daily to prevent HIV infection. This is called pre-exposure prophylaxis (PrEP). You are considered at risk if:  You are a man who has sex with other men (MSM).  You are a heterosexual man who is sexually active with multiple partners.  You take drugs by injection.  You are sexually active with a partner who has HIV.  Talk with your health care provider about whether you are at high risk of being infected with HIV. If you choose to begin PrEP, you should first be tested for HIV. You should then be tested every 3 months for as long as you are taking PrEP.  Use sunscreen. Apply sunscreen liberally and repeatedly throughout the day. You should seek shade when your shadow is shorter than you. Protect yourself by wearing long sleeves, pants, a wide-brimmed hat, and sunglasses year round whenever you are outdoors.  Tell your health care provider of new moles or changes in moles, especially if there is a change in shape or color. Also, tell your health care provider if a  mole is larger than the size of a pencil eraser.  A one-time screening for abdominal aortic aneurysm (AAA) and surgical repair of large AAAs by ultrasound is recommended for men aged 3-75 years who are current or former smokers.  Stay current with your vaccines (immunizations). This information is not intended to replace advice given to you by your health care provider. Make sure you discuss any  questions you have with your health care provider. Document Released: 03/20/2008 Document Revised: 10/13/2014 Document Reviewed: 06/26/2015 Elsevier Interactive Patient Education  2017 [employer].

## 2016-10-29 ENCOUNTER — Other Ambulatory Visit: Payer: Self-pay | Admitting: Internal Medicine

## 2016-10-29 DIAGNOSIS — I480 Paroxysmal atrial fibrillation: Secondary | ICD-10-CM

## 2016-10-29 MED ORDER — APIXABAN 5 MG PO TABS: 5.0000 mg | ORAL_TABLET | Freq: Two times a day (BID) | ORAL | 11 refills | Status: DC

## 2016-10-30 ENCOUNTER — Telehealth: Payer: Self-pay | Admitting: Interventional Cardiology

## 2016-10-30 NOTE — Telephone Encounter (Signed)
Vaughan Basta, the pts wife, is advised of Dr Hassell Done recommendations.  She verbalized understanding and is in agreement with plan.  Appt scheduled for 4/3 at 8 am.

## 2016-10-30 NOTE — Telephone Encounter (Signed)
New Message   Pt wife requesting to speak with nurse due to PCP suggesting that patient needs to start taking Eliquis because of Afib. Pt wife is questioning this because pt had heart monitor in December, and is readings were fine. Requesting call back from nurse.

## 2016-10-30 NOTE — Telephone Encounter (Signed)
**Note De-Identified Michael Lindsey Obfuscation** The pts wife is concerned that the pt wore a heart monitor that was ordered by Dr Jenny Reichmann. She states that Dr Gwynn Burly office contacted them today and wants the pt to start taking Eliquis due to monitor results showing that the pt has Afib and is referring the pt back to Dr Irish Lack.  She states that because the pt had a good OV with Dr Irish Lack on 09/26/16 she would like: 1. Dr Hassell Done opinion on weather the pt should start taking Eliquis  2. Does the pt need to f/u with Dr Irish Lack? If so how soon?  Please advise.

## 2016-10-30 NOTE — Telephone Encounter (Signed)
PAF noted on ECG.  CHADS score is only 1 based on age > 65.  Stroke risk is low.  Can start aspirin 325 mg daily instead of Eliquis.  Keep a record of how often he is feeling palpitations.  I can see him back in a month or so.  If his palpitations are getting more frequent, could consider Eliquis for stronger anticoagulation.

## 2016-11-05 ENCOUNTER — Telehealth: Payer: Self-pay | Admitting: Family

## 2016-11-05 NOTE — Telephone Encounter (Signed)
Patient called in stating that pharmacy keeps calling him to come in to pick up eliquis.  Patient states he has decided to wait until he meets with his cardiologist.  Patient is requesting our office to call [facility] on [address] to let them know he does not want to pick up.

## 2016-11-06 NOTE — Telephone Encounter (Signed)
Advised pharmacy about this.

## 2016-11-13 ENCOUNTER — Encounter: Payer: Self-pay | Admitting: Physician Assistant

## 2016-11-17 ENCOUNTER — Encounter: Payer: Self-pay | Admitting: Physician Assistant

## 2016-11-17 ENCOUNTER — Ambulatory Visit (INDEPENDENT_AMBULATORY_CARE_PROVIDER_SITE_OTHER): Payer: Medicare Other | Admitting: Physician Assistant

## 2016-11-17 DIAGNOSIS — I480 Paroxysmal atrial fibrillation: Secondary | ICD-10-CM

## 2016-11-17 NOTE — Progress Notes (Signed)
Cardiology Office Note    Date:  11/17/2016   ID:  Michael Lindsey, DOB 03-07-1951, MRN [id number]  PCP:  [name], FNP  Cardiologist: Dr. Irish Lack  Chief Complaint  Patient presents with  . Palpitations    History of Present Illness:  Michael Lindsey is a 66 y.o. male with history of palpitations over the past several years. Last seen by Dr. Irish Lack 09/26/16. Exercises regularly, swims in [employer]. He was wearing an event monitor at the time.2 Decho that day was normal, LVEF 60-65%. Event monitor showed Atrial fib 1% of the time, fastest HR 170/m, longest 9:15 min at 3 am. Dr. Jenny Reichmann initially ordered Eliquis but patient wanted to talk to Dr. Irish Lack first. CHADSVASC=1 for age.  Patient comes in to follow-up accompanied by his wife. He says he very rarely has palpitations, may be once or twice a year. He has had no symptoms since the one that prompted the monitor. I explained atrial fibrillation and the monitor to him in detail. He was very Patent attorney.   Past Medical History:  Diagnosis Date  . Allergy   . Fracture    L arm @ 10; 3 ribs with shoulder separation & PTX 2003  . Gastritis 09/13/2007   EGD  . GERD (gastroesophageal reflux disease)   . Hepatitis A 1972   from exposure to septic tank which malfunctioned  . Internal hemorrhoids 09/13/2007   COLON  . Osteoporosis     Past Surgical History:  Procedure Laterality Date  . cataract surgery  10/2014  . COLONOSCOPY  2011   Dr Sharlett Iles  . HERNIA REPAIR    . SKIN CANCER EXCISION  2011   R calf, Dr Jarome Matin  . UPPER GASTROINTESTINAL ENDOSCOPY  2003 & 2008    H pylori 2008    Current Medications: Outpatient Medications Prior to Visit  Medication Sig Dispense Refill  . acetaminophen (TYLENOL) 325 MG tablet Take 650 mg by mouth every 6 (six) hours as needed (pain).     . Ascorbic Acid (VITAMIN C) 100 MG tablet Take 100 mg by mouth daily.    . cholecalciferol (VITAMIN D) 1000 UNITS tablet Take  1,000 Units by mouth daily.    . Flaxseed, Linseed, (FLAXSEED OIL) 1000 MG CAPS Take by mouth as directed.     . ranitidine (ZANTAC) 150 MG tablet Take 150 mg by mouth as needed for heartburn (Take as directed).     . thiamine (VITAMIN B-1) 100 MG tablet Take 100 mg by mouth daily.    . vitamin E 100 UNIT capsule Take 100 Units by mouth daily.     Marland Kitchen apixaban (ELIQUIS) 5 MG TABS tablet Take 1 tablet (5 mg total) by mouth 2 (two) times daily. (Patient not taking: Reported on 11/17/2016) 60 tablet 11  . aspirin EC 81 MG tablet Take 1 tablet (81 mg total) by mouth daily. (Patient not taking: Reported on 11/17/2016) 90 tablet 11   No facility-administered medications prior to visit.      Allergies:   Chlorine   Social History   Social History  . Marital status: Married    Spouse name: N/A  . Number of children: 2  . Years of education: 73   Occupational History  . IMMIGRATION ATTORNEY    Social History Main Topics  . Smoking status: Former Smoker    Quit date: 10/06/1980  . Smokeless tobacco: Never Used     Comment: intermittent smoker over 4 years in college &  again in 1983 before quitting, never > 2 mos @ a time,never > 1/2 ppd  . Alcohol use No     Comment: SOCIALLY,  . Drug use: No  . Sexual activity: Not Asked   Other Topics Concern  . None   Social History Narrative   GETS REG EXERCISE           Family History:  The patient's   family history includes Alcohol abuse in his paternal uncle; Colon cancer (age of onset: 50) in his father; Coronary artery disease in his father; Depression in his brother and mother; Emphysema in his father; Tuberculosis in his mother.   ROS:   Please see the history of present illness.    Review of Systems  Constitution: Negative.  HENT: Negative.   Cardiovascular: Positive for palpitations.  Respiratory: Negative.   Endocrine: Negative.   Hematologic/Lymphatic: Negative.   Musculoskeletal: Negative.   Gastrointestinal: Negative.     Genitourinary: Negative.   Neurological: Negative.    All other systems reviewed and are negative.   PHYSICAL EXAM:   VS:  BP 114/72   Pulse 67   Ht 6' (1.829 m)   Wt 171 lb 1.9 oz (77.6 kg)   SpO2 98%   BMI 23.21 kg/m   Physical Exam  GEN: Well nourished, well developed, in no acute distress  Neck: no JVD, carotid bruits, or masses Cardiac:RRR; no murmurs, rubs, or gallops  Respiratory:  clear to auscultation bilaterally, normal work of breathing GI: soft, nontender, nondistended, + BS Ext: without cyanosis, clubbing, or edema, Good distal pulses bilaterally Psych: euthymic mood, full affect  Wt Readings from Last 3 Encounters:  11/17/16 171 lb 1.9 oz (77.6 kg)  10/20/16 178 lb (80.7 kg)  09/26/16 170 lb (77.1 kg)      Studies/Labs Reviewed:   EKG:  EKG is not ordered today.  Recent Labs: 09/24/2016: ALT 15; BUN 13; Creatinine, Ser 0.86; Hemoglobin 15.4; Platelets 239.0; Potassium 4.3; Sodium 138; TSH 2.07   Lipid Panel    Component Value Date/Time   CHOL 150 10/20/2016 1021   TRIG 70.0 10/20/2016 1021   HDL 64.10 10/20/2016 1021   CHOLHDL 2 10/20/2016 1021   VLDL 14.0 10/20/2016 1021   LDLCALC 72 10/20/2016 1021    Additional studies/ records that were reviewed today include:  2Decho 09/26/16: Study Conclusions   - Left ventricle: The cavity size was normal. Systolic function was   normal. The estimated ejection fraction was in the range of 60%   to 65%. Wall motion was normal; there were no regional wall   motion abnormalities. Left ventricular diastolic function   parameters were normal. - Aortic valve: Trileaflet; normal thickness leaflets. There was no   regurgitation. - Aortic root: The aortic root was normal in size. - Ascending aorta: The ascending aorta was normal in size. - Mitral valve: Transvalvular velocity was within the normal range.   There was no evidence for stenosis. There was no regurgitation. - Right ventricle: The cavity size was  normal. Wall thickness was   normal. Systolic function was normal. - Right atrium: The atrium was normal in size. - Tricuspid valve: There was trivial regurgitation. - Pulmonary arteries: Systolic pressure was within the normal   range. - Inferior vena cava: The vessel was normal in size. - Pericardium, extracardiac: There was no pericardial effusion.   Impressions:   - Normal study.      ASSESSMENT:    1. PAF (paroxysmal atrial fibrillation) ([place])  PLAN:  In order of problems listed above:   PAF: Patient had A. fib 1% of the time he wore the monitor.CHADSVASC=1 for age. We'll continue aspirin for now. He has an appointment to see Dr.Varanasi 01/08/17. He is to call if he has any further palpitations or fast heart rates. He is not on any rate lowering medications. Fastest heart rate was 170 bpm while asleep. 2-D echo normal.   Medication Adjustments/Labs and Tests Ordered: Current medicines are reviewed at length with the patient today.  Concerns regarding medicines are outlined above.  Medication changes, Labs and Tests ordered today are listed in the Patient Instructions below. Patient Instructions  Medication Instructions:  Your physician recommends that you continue on your current medications as directed. Please refer to the Current Medication list given to you today.   Labwork: -None  Testing/Procedures: -None  Follow-Up: Your physician recommends that you keep your scheduled follow-up appointment with Dr. Irish Lack.   Any Other Special Instructions Will Be Listed Below (If Applicable).     If you need a refill on your cardiac medications before your next appointment, please call your pharmacy.      Sumner Boast, PA-C  11/17/2016 9:18 AM    [facility] [address], [place], [place]  [address] Phone: [phone]; Fax: [phone]

## 2016-11-17 NOTE — Patient Instructions (Signed)
Medication Instructions:  Your physician recommends that you continue on your current medications as directed. Please refer to the Current Medication list given to you today.   Labwork: -None  Testing/Procedures: -None  Follow-Up: Your physician recommends that you keep your scheduled follow-up appointment with Dr. Irish Lack.   Any Other Special Instructions Will Be Listed Below (If Applicable).     If you need a refill on your cardiac medications before your next appointment, please call your pharmacy.

## 2016-12-30 DIAGNOSIS — L235 Allergic contact dermatitis due to other chemical products: Secondary | ICD-10-CM | POA: Diagnosis not present

## 2016-12-30 DIAGNOSIS — Z85828 Personal history of other malignant neoplasm of skin: Secondary | ICD-10-CM | POA: Diagnosis not present

## 2017-01-06 ENCOUNTER — Ambulatory Visit: Payer: Medicare Other | Admitting: Interventional Cardiology

## 2017-01-20 ENCOUNTER — Ambulatory Visit (INDEPENDENT_AMBULATORY_CARE_PROVIDER_SITE_OTHER): Payer: Medicare Other | Admitting: Interventional Cardiology

## 2017-01-20 ENCOUNTER — Encounter: Payer: Self-pay | Admitting: Interventional Cardiology

## 2017-01-20 VITALS — BP 116/64 | HR 68 | Ht 72.0 in | Wt 170.0 lb

## 2017-01-20 DIAGNOSIS — R002 Palpitations: Secondary | ICD-10-CM | POA: Diagnosis not present

## 2017-01-20 DIAGNOSIS — I480 Paroxysmal atrial fibrillation: Secondary | ICD-10-CM

## 2017-01-20 MED ORDER — ASPIRIN EC 81 MG PO TBEC: 162.0000 mg | DELAYED_RELEASE_TABLET | Freq: Every day | ORAL | 3 refills | Status: DC

## 2017-01-20 NOTE — Patient Instructions (Addendum)
Medication Instructions:  DECREASE your aspirin to 162 mg daily    Labwork: None ordered  Testing/Procedures: None ordered  Follow-Up: Your physician wants you to follow-up in: 6 months with Dr. Irish Lack. You will receive a reminder letter in the mail two months in advance. If you don't receive a letter, please call our office to schedule the follow-up appointment.   Any Other Special Instructions Will Be Listed Below (If Applicable).     If you need a refill on your cardiac medications before your next appointment, please call your pharmacy.

## 2017-01-20 NOTE — Progress Notes (Signed)
Cardiology Office Note   Date:  01/20/2017   ID:  Michael Lindsey, DOB November 29, 1950, MRN [id number]  PCP:  Michael Po, FNP    No chief complaint on file. PAF   Wt Readings from Last 3 Encounters:  01/20/17 170 lb (77.1 kg)  11/17/16 171 lb 1.9 oz (77.6 kg)  10/20/16 178 lb (80.7 kg)       History of Present Illness: Michael Lindsey is a 66 y.o. male  Who has had rare palpitations in the past over the past few years.    Over the past month, he had some a cough and cold.  He noted some shortlived palpitations, about 1-2 minutes.  He did a brief workout on the elliptical.  No lightheadedness or syncope with the palpitations.    No prior cardiac testing.    He typically exercises.  He swims twice a week, and swam in [employer].  He does weights, golf, biking.  Knee pain is his only limitation.  He is trying to play more tenderness now. He is taking lessons. He finds this activity quite physically strenuous.  Since the last episode of palpitations, he has felt fine.  No problems with exercise.   He does not drink much caffeine.   When he does, he can feel palpitations.  He had one episode like this recently.  Wife notes that he may stop breathing at times. It is not frequent. His snoring is not consistent either.    Past Medical History:  Diagnosis Date  . Allergy   . Fracture    L arm @ 10; 3 ribs with shoulder separation & PTX 2003  . Gastritis 09/13/2007   EGD  . GERD (gastroesophageal reflux disease)   . Hepatitis A 1972   from exposure to septic tank which malfunctioned  . Internal hemorrhoids 09/13/2007   COLON  . Osteoporosis     Past Surgical History:  Procedure Laterality Date  . cataract surgery  10/2014  . COLONOSCOPY  2011   Dr Sharlett Iles  . HERNIA REPAIR    . SKIN CANCER EXCISION  2011   R calf, Dr Jarome Matin  . UPPER GASTROINTESTINAL ENDOSCOPY  2003 & 2008    H pylori 2008     Current Outpatient Prescriptions  Medication Sig  Dispense Refill  . acetaminophen (TYLENOL) 325 MG tablet Take 650 mg by mouth daily as needed for mild pain, moderate pain or headache (pain).     . Ascorbic Acid (VITAMIN C) 100 MG tablet Take 100 mg by mouth daily.    Marland Kitchen aspirin 325 MG tablet Take 325 mg by mouth daily.    . cholecalciferol (VITAMIN D) 1000 UNITS tablet Take 1,000 Units by mouth daily.    . Flaxseed, Linseed, (FLAXSEED OIL) 1000 MG CAPS Take by mouth as directed.     . Potassium (POTASSIMIN Lindsey) Take 1 tablet by mouth daily.    . ranitidine (ZANTAC) 150 MG tablet Take 150 mg by mouth as needed for heartburn (Take as directed).     . thiamine (VITAMIN B-1) 100 MG tablet Take 100 mg by mouth daily.    . vitamin E 100 UNIT capsule Take 100 Units by mouth daily.      No current facility-administered medications for this visit.     Allergies:   Chlorine    Social History:  The patient  reports that he quit smoking about 36 years ago. He has never used smokeless tobacco. He reports that  he does not drink alcohol or use drugs.   Family History:  The patient's family history includes Alcohol abuse in his paternal uncle; Colon cancer (age of onset: 27) in his father; Coronary artery disease in his father; Depression in his brother and mother; Emphysema in his father; Tuberculosis in his mother.    ROS:  Please see the history of present illness.   Otherwise, review of systems are positive for recent palpitaitons.   All other systems are reviewed and negative.    PHYSICAL EXAM: VS:  BP 116/64   Pulse 68   Ht 6' (1.829 m)   Wt 170 lb (77.1 kg)   BMI 23.06 kg/m  , BMI Body mass index is 23.06 kg/m. GEN: Well nourished, well developed, in no acute distress  HEENT: normal  Neck: no JVD, carotid bruits, or masses Cardiac: RRR; no murmurs, rubs, or gallops,no edema  Respiratory:  clear to auscultation bilaterally, normal work of breathing GI: soft, nontender, nondistended, + BS MS: no deformity or atrophy  Skin: warm and dry,  no rash Neuro:  Strength and sensation are intact Psych: euthymic mood, full affect   EKG:   The ekg ordered today demonstrates Normal ECG   Recent Labs: 09/24/2016: ALT 15; BUN 13; Creatinine, Ser 0.86; Hemoglobin 15.4; Platelets 239.0; Potassium 4.3; Sodium 138; TSH 2.07   Lipid Panel    Component Value Date/Time   CHOL 150 10/20/2016 1021   TRIG 70.0 10/20/2016 1021   HDL 64.10 10/20/2016 1021   CHOLHDL 2 10/20/2016 1021   VLDL 14.0 10/20/2016 1021   LDLCALC 72 10/20/2016 1021     Other studies Reviewed: Additional studies/ records that were reviewed today with results demonstrating: OV with Dr. Jenny Reichmann.   ASSESSMENT AND PLAN:  1. PAF: no syncope or lightheadedness.  PAF noted on monitor.  Excellent exercise tolerance- he continues to be very active.  Continuing aspirin for now.  Can decrease dose to 162 mg daily to reduce bleeding risk.  I asked the wife to watch for symptoms of sleep apnea. If he does have significant sleep apnea, this could be a trigger for his atrial fibrillation. He'll consider a sleep study. 2. Normal exam. No structural heart disease by echo.  Normal LVEF and valvular function.    3. Discussed continuing healthy lifestyle.  He continues to stay active.    Current medicines are reviewed at length with the patient today.  The patient concerns regarding his medicines were addressed.  The following changes have been made:  No change  Labs/ tests ordered today include:  No orders of the defined types were placed in this encounter.   Recommend 150 minutes/week of aerobic exercise Low fat, low carb, high fiber diet recommended  Disposition:   FU 6 months   Signed, Larae Grooms, MD  01/20/2017 11:39 AM    [facility] [address], [place], [place]  [address] Phone: [phone]; Fax: [phone]

## 2017-01-27 DIAGNOSIS — Z85828 Personal history of other malignant neoplasm of skin: Secondary | ICD-10-CM | POA: Diagnosis not present

## 2017-01-27 DIAGNOSIS — D692 Other nonthrombocytopenic purpura: Secondary | ICD-10-CM | POA: Diagnosis not present

## 2017-02-03 ENCOUNTER — Encounter (INDEPENDENT_AMBULATORY_CARE_PROVIDER_SITE_OTHER): Payer: Self-pay | Admitting: Orthopaedic Surgery

## 2017-02-03 ENCOUNTER — Ambulatory Visit (INDEPENDENT_AMBULATORY_CARE_PROVIDER_SITE_OTHER): Payer: Medicare Other | Admitting: Orthopaedic Surgery

## 2017-02-03 VITALS — BP 111/66 | HR 72 | Resp 14 | Ht 72.0 in | Wt 170.0 lb

## 2017-02-03 DIAGNOSIS — M25561 Pain in right knee: Secondary | ICD-10-CM

## 2017-02-03 DIAGNOSIS — G8929 Other chronic pain: Secondary | ICD-10-CM | POA: Diagnosis not present

## 2017-02-03 MED ORDER — BUPIVACAINE HCL 0.5 % IJ SOLN
3.0000 mL | INTRAMUSCULAR | Status: AC | PRN
  Administered 2017-02-03: 3 mL via INTRA_ARTICULAR

## 2017-02-03 MED ORDER — METHYLPREDNISOLONE ACETATE 40 MG/ML IJ SUSP
80.0000 mg | INTRAMUSCULAR | Status: AC | PRN
  Administered 2017-02-03: 80 mg

## 2017-02-03 MED ORDER — LIDOCAINE HCL 1 % IJ SOLN
5.0000 mL | INTRAMUSCULAR | Status: AC | PRN
  Administered 2017-02-03: 5 mL

## 2017-02-03 NOTE — Progress Notes (Signed)
Office Visit Note   Patient: Michael Lindsey           Date of Birth: 06/25/1951           MRN: [id number] Visit Date: 02/03/2017              Requested by: [address], [place], [place] [address] PCP: [name], FNP   Assessment & Plan: Visit Diagnoses:  1. Chronic pain of right knee    Osteoarthritis right knee. Plan: Cortisone injection right knee, precertify Visco supplementation  Follow-Up Instructions: Return pre ceet visco supplementation.   Orders:  No orders of the defined types were placed in this encounter.  No orders of the defined types were placed in this encounter.     Procedures: Large Joint Inj Date/Time: 02/03/2017 1:39 PM Performed by: Garald Balding Authorized by: Garald Balding   Consent Given by:  Patient Timeout: prior to procedure the correct patient, procedure, and site was verified   Indications:  Pain and joint swelling Location:  Knee Site:  R knee Prep: patient was prepped and draped in usual sterile fashion   Needle Size:  25 G Needle Length:  1.5 inches Approach:  Anteromedial Ultrasound Guidance: No   Fluoroscopic Guidance: No   Arthrogram: No   Medications:  5 mL lidocaine 1 %; 80 mg methylPREDNISolone acetate 40 MG/ML; 3 mL bupivacaine 0.5 % Aspiration Attempted: No   Patient tolerance:  Patient tolerated the procedure well with no immediate complications     Clinical Data: No additional findings.   Subjective: Chief Complaint  Patient presents with  . Right Knee - Pain  Michael Lindsey presents today with right knee pain . His past history is significant in that he had a knee arthroscopy in 2015 with a partial medial meniscectomy and microfracture of the medial femoral condyle where there was an area about the size of a dime of grade 4 chondromalacia. He's been very active playing golf or walking  and has done well without any problems. Recently he was playing tennis and had a recurrent effusion  with some medial joint pain. He does have a history of reflux and has avoided NSAIDs. His knee is feeling better today but he is obviously concerned about his ability to continue to perform activities without restriction.  HPI  Review of Systems   Objective: Vital Signs: BP 111/66   Pulse 72   Resp 14   Ht 6' (1.829 m)   Wt 170 lb (77.1 kg)   BMI 23.06 kg/m   Physical Exam  Ortho Exam right knee exam with slight increased varus. Had no effusion today. Minimal patellar crepitation. Mild medial joint pain. Full extension and flexion over 105 without instability. Neurovascular exam intact. Walks without a limp.  Specialty Comments:  No specialty comments available.  Imaging: No results found.   PMFS History: Patient Active Problem List   Diagnosis Date Noted  . PAF (paroxysmal atrial fibrillation) ([place]) 11/17/2016  . Medicare annual wellness visit, subsequent 10/20/2016  . Palpitations 09/24/2016  . Dyspnea 09/24/2016  . Fatigue 09/24/2016  . Eczema 11/03/2013  . Allergic rhinitis 06/01/2013  . Skin cancer 08/11/2012  . Dysphagia 12/08/2007  . Osteopenia 09/14/2007  . Esophageal reflux 04/13/2007   Past Medical History:  Diagnosis Date  . Allergy   . Fracture    L arm @ 10; 3 ribs with shoulder separation & PTX 2003  . Gastritis 09/13/2007   EGD  . GERD (  gastroesophageal reflux disease)   . Hepatitis A 1972   from exposure to septic tank which malfunctioned  . Internal hemorrhoids 09/13/2007   COLON  . Osteoporosis     Family History  Problem Relation Age of Onset  . Depression Mother   . Tuberculosis Mother   . Coronary artery disease Father   . Emphysema Father   . Colon cancer Father 46  . Depression Brother   . Alcohol abuse Paternal Uncle   . Diabetes Neg Hx   . Stroke Neg Hx     Past Surgical History:  Procedure Laterality Date  . cataract surgery  10/2014  . COLONOSCOPY  2011   Dr Sharlett Iles  . HERNIA REPAIR    . SKIN CANCER EXCISION  2011     R calf, Dr Jarome Matin  . UPPER GASTROINTESTINAL ENDOSCOPY  2003 & 2008    H pylori 2008   Social History   Occupational History  . IMMIGRATION ATTORNEY    Social History Main Topics  . Smoking status: Former Smoker    Quit date: 10/06/1980  . Smokeless tobacco: Never Used     Comment: intermittent smoker over 4 years in college & again in 1983 before quitting, never > 2 mos @ a time,never > 1/2 ppd  . Alcohol use No     Comment: SOCIALLY,  . Drug use: No  . Sexual activity: Not on file     Garald Balding, MD   Note - This record has been created using [employer].  Chart creation errors have been sought, but may not always  have been located. Such creation errors do not reflect on  the standard of medical care.

## 2017-02-13 ENCOUNTER — Telehealth (INDEPENDENT_AMBULATORY_CARE_PROVIDER_SITE_OTHER): Payer: Self-pay | Admitting: Orthopaedic Surgery

## 2017-02-13 NOTE — Telephone Encounter (Signed)
-----   Message from Shona Needles, RT sent at 02/05/2017  4:04 PM EDT ----- Regarding: EUFLEXXA LEFT KNEE BUY AND BILL BRIAN PETRARCA Please call patient and schedule Buy and Bill Left Euflexxa inj. X 3 with Aaron Edelman on Wednesday's. Thank you.

## 2017-02-13 NOTE — Telephone Encounter (Signed)
Patient will call back Monday (02/16/17) to schedule injections. He is traveling right now. But he says his right knee has been bothering him more so than the left and thought the injections were for the right.

## 2017-02-18 ENCOUNTER — Ambulatory Visit (INDEPENDENT_AMBULATORY_CARE_PROVIDER_SITE_OTHER): Payer: Medicare Other | Admitting: Orthopedic Surgery

## 2017-02-18 DIAGNOSIS — M1712 Unilateral primary osteoarthritis, left knee: Secondary | ICD-10-CM

## 2017-02-18 DIAGNOSIS — M1711 Unilateral primary osteoarthritis, right knee: Secondary | ICD-10-CM

## 2017-02-18 MED ORDER — SODIUM HYALURONATE (VISCOSUP) 20 MG/2ML IX SOSY
20.0000 mg | PREFILLED_SYRINGE | INTRA_ARTICULAR | Status: AC | PRN
  Administered 2017-02-18: 20 mg via INTRA_ARTICULAR

## 2017-02-18 MED ORDER — LIDOCAINE HCL 1 % IJ SOLN
3.0000 mL | INTRAMUSCULAR | Status: AC | PRN
  Administered 2017-02-18: 3 mL

## 2017-02-18 NOTE — Progress Notes (Signed)
Office Visit Note   Patient: Michael Lindsey           Date of Birth: Dec 13, 1950           MRN: [id number] Visit Date: 02/18/2017              Requested by: [address], [place], [place] [address] PCP: Golden Circle, FNP   Assessment & Plan: Visit Diagnoses:  1. Unilateral primary osteoarthritis, left knee   2. Unilateral primary osteoarthritis, right knee     Plan:  #1: Bilateral Euflex injections was given without difficulty #2: Follow back up 1 week for his second Euflex injections bilaterally  Follow-Up Instructions: Return in about 1 week (around 02/25/2017).   Orders:  No orders of the defined types were placed in this encounter.  No orders of the defined types were placed in this encounter.     Procedures: Large Joint Inj Date/Time: 02/18/2017 3:22 PM Performed by: Biagio Borg D Authorized by: Biagio Borg D   Consent Given by:  Patient Timeout: prior to procedure the correct patient, procedure, and site was verified   Indications:  Pain and joint swelling Location:  Knee Site:  L knee Prep: patient was prepped and draped in usual sterile fashion   Needle Size:  25 G Needle Length:  1.5 inches Approach:  Anteromedial Ultrasound Guidance: No   Fluoroscopic Guidance: No   Arthrogram: No   Medications:  20 mg Sodium Hyaluronate 20 MG/2ML; 3 mL lidocaine 1 % Aspiration Attempted: No   Patient tolerance:  Patient tolerated the procedure well with no immediate complications Large Joint Inj Date/Time: 02/18/2017 3:22 PM Performed by: Biagio Borg D Authorized by: Biagio Borg D   Consent Given by:  Patient Timeout: prior to procedure the correct patient, procedure, and site was verified   Indications:  Pain and joint swelling Location:  Knee Site:  R knee Prep: patient was prepped and draped in usual sterile fashion   Needle Size:  25 G Needle Length:  1.5 inches Approach:  Anteromedial Ultrasound Guidance: No     Fluoroscopic Guidance: No   Arthrogram: No   Medications:  3 mL lidocaine 1 %; 20 mg Sodium Hyaluronate 20 MG/2ML Aspiration Attempted: No   Patient tolerance:  Patient tolerated the procedure well with no immediate complications     Clinical Data: No additional findings.   Subjective: Chief Complaint  Patient presents with  . Left Knee - Pain    Euflexxa # 1 buy and bill  . Right Knee - Pain    Right knee worse than left, Euflexxa # 1 buy and bill    [name] presents today with bilateral knee pain . His past history is significant in that he had a right knee arthroscopy in 2015 with a partial medial meniscectomy and microfracture of the medial femoral condyle where there was an area about the size of a dime of grade 4 chondromalacia. He's been very active playing golf or walking  and has done well without any problems. Recently he was playing tennis and had a recurrent effusion with some right medial joint pain. He does have a history of reflux and has avoided NSAIDs. He was seen recently and had a cortisone injection to the right knee. He states that that was only minimally effective. He is also had problems with the left knee with arthritic symptoms. He comes in today for injections of the knees. He had been precertified for Euflexxa  for both knees and has had approval. He would like to proceed with both knees.    Review of Systems  Cardiovascular: Positive for palpitations.  All other systems reviewed and are negative.    Objective: Vital Signs: There were no vitals taken for this visit.  Physical Exam  Constitutional: He is oriented to person, place, and time. He appears well-developed and well-nourished.  HENT:  Head: Normocephalic and atraumatic.  Eyes: EOM are normal. Pupils are equal, round, and reactive to light.  Pulmonary/Chest: Effort normal.  Neurological: He is alert and oriented to person, place, and time.  Skin: Skin is warm and dry.  Psychiatric: He  has a normal mood and affect. His behavior is normal. Judgment and thought content normal.    Ortho Exam  Exam today reveals range of motion of both knees from 0 to about 105. He does not have any effusion at this time. He does have some medial joint line pain. Some crepitance but this is minimal at best in the patellofemoral area.  Specialty Comments:  No specialty comments available.  Imaging: No results found.   PMFS History: Patient Active Problem List   Diagnosis Date Noted  . PAF (paroxysmal atrial fibrillation) ([place]) 11/17/2016  . Medicare annual wellness visit, subsequent 10/20/2016  . Palpitations 09/24/2016  . Dyspnea 09/24/2016  . Fatigue 09/24/2016  . Eczema 11/03/2013  . Allergic rhinitis 06/01/2013  . Skin cancer 08/11/2012  . Dysphagia 12/08/2007  . Osteopenia 09/14/2007  . Esophageal reflux 04/13/2007   Past Medical History:  Diagnosis Date  . Allergy   . Fracture    L arm @ 10; 3 ribs with shoulder separation & PTX 2003  . Gastritis 09/13/2007   EGD  . GERD (gastroesophageal reflux disease)   . Hepatitis A 1972   from exposure to septic tank which malfunctioned  . Internal hemorrhoids 09/13/2007   COLON  . Osteoporosis     Family History  Problem Relation Age of Onset  . Depression Mother   . Tuberculosis Mother   . Coronary artery disease Father   . Emphysema Father   . Colon cancer Father 72  . Depression Brother   . Alcohol abuse Paternal Uncle   . Diabetes Neg Hx   . Stroke Neg Hx     Past Surgical History:  Procedure Laterality Date  . cataract surgery  10/2014  . COLONOSCOPY  2011   Dr Sharlett Iles  . HERNIA REPAIR    . SKIN CANCER EXCISION  2011   R calf, Dr Jarome Matin  . UPPER GASTROINTESTINAL ENDOSCOPY  2003 & 2008    H pylori 2008   Social History   Occupational History  . IMMIGRATION ATTORNEY    Social History Main Topics  . Smoking status: Former Smoker    Quit date: 10/06/1980  . Smokeless tobacco: Never Used      Comment: intermittent smoker over 4 years in college & again in 1983 before quitting, never > 2 mos @ a time,never > 1/2 ppd  . Alcohol use No     Comment: SOCIALLY,  . Drug use: No  . Sexual activity: Not on file

## 2017-02-21 DIAGNOSIS — M542 Cervicalgia: Secondary | ICD-10-CM | POA: Diagnosis not present

## 2017-02-21 DIAGNOSIS — T148XXA Other injury of unspecified body region, initial encounter: Secondary | ICD-10-CM | POA: Diagnosis not present

## 2017-02-23 ENCOUNTER — Telehealth: Payer: Self-pay | Admitting: Interventional Cardiology

## 2017-02-23 DIAGNOSIS — I480 Paroxysmal atrial fibrillation: Secondary | ICD-10-CM

## 2017-02-23 NOTE — Telephone Encounter (Signed)
I spoke with patient's wife, Michael Lindsey. Pt's wife states that pt had an episode of heart racing, short lasting, [place].  Pt's  wife states pt states similar to past episodes of palpitations.  Pt's wife states she has noticed that pt does snore a lot.  Pt's wife states Dr Irish Lack discussed having sleep study with patient at time of last office visit in April 2018.  Pt's wife states because of snoring a recent episode of palpitations pt would like to go ahead with sleep study.  Pt's wife advised I will forward to Dr Irish Lack for review and recommendations.

## 2017-02-23 NOTE — Telephone Encounter (Signed)
New message    Pt is calling asking about a sleep study. Pt states it was recommended by doctor and they are calling for referral.   Patient c/o Palpitations:  High priority if patient c/o lightheadedness and shortness of breath.  1. How long have you been having palpitations? [place]  2. Are you currently experiencing lightheadedness and shortness of breath? Slightly dizzy  3. Have you checked your BP and heart rate? (document readings) no  4. Are you experiencing any other symptoms? No other symptoms

## 2017-02-23 NOTE — Telephone Encounter (Signed)
OK to order sleep study for atrial fibrillation.

## 2017-02-24 NOTE — Telephone Encounter (Signed)
Patient called and was informed that Dr. Irish Lack said that it was okay to schedule the sleep study for atrial fibrillation.  ESS Score was 11. Order placed. Patient made aware that message will be routed to the person responsible for scheduling the sleep study and that he should receive a phone call soon to set this up.

## 2017-02-25 ENCOUNTER — Ambulatory Visit (INDEPENDENT_AMBULATORY_CARE_PROVIDER_SITE_OTHER): Payer: Medicare Other | Admitting: Orthopedic Surgery

## 2017-02-26 ENCOUNTER — Ambulatory Visit (INDEPENDENT_AMBULATORY_CARE_PROVIDER_SITE_OTHER): Payer: Medicare Other | Admitting: Orthopaedic Surgery

## 2017-02-26 DIAGNOSIS — M170 Bilateral primary osteoarthritis of knee: Secondary | ICD-10-CM | POA: Diagnosis not present

## 2017-02-26 MED ORDER — LIDOCAINE HCL 1 % IJ SOLN
3.0000 mL | INTRAMUSCULAR | Status: AC | PRN
  Administered 2017-02-26: 3 mL

## 2017-02-26 MED ORDER — SODIUM HYALURONATE (VISCOSUP) 20 MG/2ML IX SOSY
20.0000 mg | PREFILLED_SYRINGE | INTRA_ARTICULAR | Status: AC | PRN
  Administered 2017-02-26: 20 mg via INTRA_ARTICULAR

## 2017-02-26 NOTE — Progress Notes (Signed)
Office Visit Note   Patient: Michael Lindsey           Date of Birth: [birth year]           MRN: [id number] Visit Date: 02/26/2017              Requested by: [address], [place], [place] [address] PCP: Golden Circle, FNP   Assessment & Plan: Visit Diagnoses: No diagnosis found.  Plan:  #1: Euflex injections done bilaterally  Follow-Up Instructions: Return in about 6 days (around 03/04/2017).   Orders:  No orders of the defined types were placed in this encounter.  No orders of the defined types were placed in this encounter.     Procedures: Large Joint Inj Date/Time: 02/26/2017 2:05 PM Performed by: Biagio Borg D Authorized by: Biagio Borg D   Consent Given by:  Patient Timeout: prior to procedure the correct patient, procedure, and site was verified   Indications:  Pain and joint swelling Location:  Knee Site:  L knee Prep: patient was prepped and draped in usual sterile fashion   Needle Size:  25 G Needle Length:  1.5 inches Approach:  Anteromedial Ultrasound Guidance: No   Fluoroscopic Guidance: No   Arthrogram: No   Medications:  20 mg Sodium Hyaluronate 20 MG/2ML; 3 mL lidocaine 1 % Aspiration Attempted: No   Patient tolerance:  Patient tolerated the procedure well with no immediate complications Large Joint Inj Date/Time: 02/26/2017 2:06 PM Performed by: Biagio Borg D Authorized by: Biagio Borg D   Consent Given by:  Patient Timeout: prior to procedure the correct patient, procedure, and site was verified   Indications:  Pain and joint swelling Location:  Knee Site:  R knee Prep: patient was prepped and draped in usual sterile fashion   Needle Size:  25 G Needle Length:  1.5 inches Approach:  Anteromedial Ultrasound Guidance: No   Fluoroscopic Guidance: No   Arthrogram: No   Medications:  3 mL lidocaine 1 %; 20 mg Sodium Hyaluronate 20 MG/2ML Aspiration Attempted: No   Patient tolerance:  Patient tolerated  the procedure well with no immediate complications     Clinical Data: No additional findings.   Subjective: Chief Complaint  Patient presents with  . Left Knee - Injections    She Is seen today for his second Euflexxa injection. He states the first ones have already given him some relief. Denies any reactivity. Seen for the second today.    Review of Systems  Constitutional: Negative.   HENT: Negative.   Respiratory: Negative.   Cardiovascular: Negative.   Gastrointestinal: Negative.   Genitourinary: Negative.   Skin: Negative.   Neurological: Negative.   Hematological: Negative.   Psychiatric/Behavioral: Negative.      Objective: Vital Signs: There were no vitals taken for this visit.  Physical Exam  Constitutional: He is oriented to person, place, and time. He appears well-developed and well-nourished.  HENT:  Head: Normocephalic and atraumatic.  Eyes: EOM are normal. Pupils are equal, round, and reactive to light.  Pulmonary/Chest: Effort normal.  Neurological: He is alert and oriented to person, place, and time.  Skin: Skin is warm and dry.  Psychiatric: He has a normal mood and affect. His behavior is normal. Judgment and thought content normal.    Ortho Exam  These are quite benign. No erythema or warmth. No effusions.  Specialty Comments:  No specialty comments available.  Imaging: No results found.   PMFS History: Patient  Active Problem List   Diagnosis Date Noted  . PAF (paroxysmal atrial fibrillation) ([place]) 11/17/2016  . Medicare annual wellness visit, subsequent 10/20/2016  . Palpitations 09/24/2016  . Dyspnea 09/24/2016  . Fatigue 09/24/2016  . Eczema 11/03/2013  . Allergic rhinitis 06/01/2013  . Skin cancer 08/11/2012  . Dysphagia 12/08/2007  . Osteopenia 09/14/2007  . Esophageal reflux 04/13/2007   Past Medical History:  Diagnosis Date  . Allergy   . Fracture    L arm @ 10; 3 ribs with shoulder separation & PTX 2003  .  Gastritis 09/13/2007   EGD  . GERD (gastroesophageal reflux disease)   . Hepatitis A 1972   from exposure to septic tank which malfunctioned  . Internal hemorrhoids 09/13/2007   COLON  . Osteoporosis     Family History  Problem Relation Age of Onset  . Depression Mother   . Tuberculosis Mother   . Coronary artery disease Father   . Emphysema Father   . Colon cancer Father 98  . Depression Brother   . Alcohol abuse Paternal Uncle   . Diabetes Neg Hx   . Stroke Neg Hx     Past Surgical History:  Procedure Laterality Date  . cataract surgery  10/2014  . COLONOSCOPY  2011   Dr Sharlett Iles  . HERNIA REPAIR    . SKIN CANCER EXCISION  2011   R calf, Dr Jarome Matin  . UPPER GASTROINTESTINAL ENDOSCOPY  2003 & 2008    H pylori 2008   Social History   Occupational History  . IMMIGRATION ATTORNEY    Social History Main Topics  . Smoking status: Former Smoker    Quit date: 10/06/1980  . Smokeless tobacco: Never Used     Comment: intermittent smoker over 4 years in college & again in 1983 before quitting, never > 2 mos @ a time,never > 1/2 ppd  . Alcohol use No     Comment: SOCIALLY,  . Drug use: No  . Sexual activity: Not on file

## 2017-03-03 NOTE — Telephone Encounter (Signed)
Informed patient of upcoming sleep study and patient understanding was verbalized. Patient understands his sleep study is scheduled for Wednesday April 29 2017. Patient understands his sleep study will be done at [facility] sleep lab. Patient understands he will receive a sleep packet in a week or so. Patient understands to call if he does not receive the sleep packet in a timely manner. Patient agrees with treatment and thanked me for call

## 2017-03-04 ENCOUNTER — Encounter (INDEPENDENT_AMBULATORY_CARE_PROVIDER_SITE_OTHER): Payer: Self-pay | Admitting: Orthopedic Surgery

## 2017-03-04 ENCOUNTER — Ambulatory Visit (INDEPENDENT_AMBULATORY_CARE_PROVIDER_SITE_OTHER): Payer: Medicare Other | Admitting: Orthopedic Surgery

## 2017-03-04 DIAGNOSIS — M1711 Unilateral primary osteoarthritis, right knee: Secondary | ICD-10-CM

## 2017-03-04 DIAGNOSIS — M1712 Unilateral primary osteoarthritis, left knee: Secondary | ICD-10-CM

## 2017-03-04 MED ORDER — LIDOCAINE HCL 1 % IJ SOLN
3.0000 mL | INTRAMUSCULAR | Status: AC | PRN
  Administered 2017-03-04: 3 mL

## 2017-03-04 MED ORDER — SODIUM HYALURONATE (VISCOSUP) 20 MG/2ML IX SOSY
20.0000 mg | PREFILLED_SYRINGE | INTRA_ARTICULAR | Status: AC | PRN
  Administered 2017-03-04: 20 mg via INTRA_ARTICULAR

## 2017-03-04 NOTE — Progress Notes (Signed)
Office Visit Note   Patient: Michael Lindsey           Date of Birth: 1951/07/25           MRN: [id number] Visit Date: 03/04/2017              Requested by: [address], [place], [place] [address] PCP: Golden Circle, FNP   Assessment & Plan: Visit Diagnoses:  1. Unilateral primary osteoarthritis, left knee   2. Unilateral primary osteoarthritis, right knee     Plan:  #1: Euflex injections to both knees were given without difficulty  Follow-Up Instructions: Return if symptoms worsen or fail to improve.   Orders:  No orders of the defined types were placed in this encounter.  No orders of the defined types were placed in this encounter.     Procedures: Large Joint Inj Date/Time: 03/04/2017 8:57 AM Performed by: Biagio Borg D Authorized by: Biagio Borg D   Consent Given by:  Patient Timeout: prior to procedure the correct patient, procedure, and site was verified   Indications:  Pain and joint swelling Location:  Knee Site:  L knee Prep: patient was prepped and draped in usual sterile fashion   Needle Size:  25 G Needle Length:  1.5 inches Approach:  Anteromedial Ultrasound Guidance: No   Fluoroscopic Guidance: No   Arthrogram: No   Medications:  20 mg Sodium Hyaluronate 20 MG/2ML; 3 mL lidocaine 1 % Aspiration Attempted: No   Patient tolerance:  Patient tolerated the procedure well with no immediate complications Large Joint Inj Date/Time: 03/04/2017 8:58 AM Performed by: Biagio Borg D Authorized by: Biagio Borg D   Consent Given by:  Patient Timeout: prior to procedure the correct patient, procedure, and site was verified   Indications:  Pain and joint swelling Location:  Knee Site:  R knee Prep: patient was prepped and draped in usual sterile fashion   Needle Size:  25 G Needle Length:  1.5 inches Approach:  Anteromedial Ultrasound Guidance: No   Fluoroscopic Guidance: No   Arthrogram: No   Medications:  3 mL  lidocaine 1 %; 20 mg Sodium Hyaluronate 20 MG/2ML Aspiration Attempted: No   Patient tolerance:  Patient tolerated the procedure well with no immediate complications     Clinical Data: No additional findings.   Subjective: No chief complaint on file.   Seen today for bilateral Euflex injections. No well history reactivity. So far not much benefit.    Review of Systems   Objective: Vital Signs: There were no vitals taken for this visit.  Physical Exam  Right Knee Exam   Comments:  No effusion or reactivity   Left Knee Exam   Comments:  No effusion or reactivity      Specialty Comments:  No specialty comments available.  Imaging: No results found.   PMFS History: Patient Active Problem List   Diagnosis Date Noted  . PAF (paroxysmal atrial fibrillation) ([place]) 11/17/2016  . Medicare annual wellness visit, subsequent 10/20/2016  . Palpitations 09/24/2016  . Dyspnea 09/24/2016  . Fatigue 09/24/2016  . Eczema 11/03/2013  . Allergic rhinitis 06/01/2013  . Skin cancer 08/11/2012  . Dysphagia 12/08/2007  . Osteopenia 09/14/2007  . Esophageal reflux 04/13/2007   Past Medical History:  Diagnosis Date  . Allergy   . Fracture    L arm @ 10; 3 ribs with shoulder separation & PTX 2003  . Gastritis 09/13/2007   EGD  . GERD (gastroesophageal reflux  disease)   . Hepatitis A 1972   from exposure to septic tank which malfunctioned  . Internal hemorrhoids 09/13/2007   COLON  . Osteoporosis     Family History  Problem Relation Age of Onset  . Depression Mother   . Tuberculosis Mother   . Coronary artery disease Father   . Emphysema Father   . Colon cancer Father 39  . Depression Brother   . Alcohol abuse Paternal Uncle   . Diabetes Neg Hx   . Stroke Neg Hx     Past Surgical History:  Procedure Laterality Date  . cataract surgery  10/2014  . COLONOSCOPY  2011   Dr Sharlett Iles  . HERNIA REPAIR    . SKIN CANCER EXCISION  2011   R calf, Dr Jarome Matin    . UPPER GASTROINTESTINAL ENDOSCOPY  2003 & 2008    H pylori 2008   Social History   Occupational History  . IMMIGRATION ATTORNEY    Social History Main Topics  . Smoking status: Former Smoker    Quit date: 10/06/1980  . Smokeless tobacco: Never Used     Comment: intermittent smoker over 4 years in college & again in 1983 before quitting, never > 2 mos @ a time,never > 1/2 ppd  . Alcohol use No     Comment: SOCIALLY,  . Drug use: No  . Sexual activity: Not on file

## 2017-04-13 ENCOUNTER — Telehealth (INDEPENDENT_AMBULATORY_CARE_PROVIDER_SITE_OTHER): Payer: Self-pay | Admitting: Orthopaedic Surgery

## 2017-04-13 ENCOUNTER — Other Ambulatory Visit (INDEPENDENT_AMBULATORY_CARE_PROVIDER_SITE_OTHER): Payer: Self-pay

## 2017-04-13 DIAGNOSIS — M25511 Pain in right shoulder: Secondary | ICD-10-CM

## 2017-04-13 NOTE — Telephone Encounter (Signed)
Schedule PT for shoulder strengthening exercises-pleSE CALL re PT

## 2017-04-13 NOTE — Telephone Encounter (Signed)
[facility] [address]

## 2017-04-13 NOTE — Telephone Encounter (Signed)
Please advise 

## 2017-04-13 NOTE — Telephone Encounter (Signed)
Patient called wanting to know if Dr. Durward Fortes could refer him to PT for what he feels like is a separation in his shoulder.  He feels like exercises will help this pain.  CB#[phone].  Thank you.

## 2017-04-17 ENCOUNTER — Ambulatory Visit: Payer: Medicare Other | Attending: Family | Admitting: Physical Therapy

## 2017-04-17 ENCOUNTER — Encounter: Payer: Self-pay | Admitting: Physical Therapy

## 2017-04-17 DIAGNOSIS — M6281 Muscle weakness (generalized): Secondary | ICD-10-CM | POA: Insufficient documentation

## 2017-04-17 DIAGNOSIS — G8929 Other chronic pain: Secondary | ICD-10-CM | POA: Diagnosis not present

## 2017-04-17 DIAGNOSIS — M25511 Pain in right shoulder: Secondary | ICD-10-CM | POA: Insufficient documentation

## 2017-04-17 DIAGNOSIS — M62838 Other muscle spasm: Secondary | ICD-10-CM | POA: Diagnosis not present

## 2017-04-17 DIAGNOSIS — M25512 Pain in left shoulder: Secondary | ICD-10-CM | POA: Insufficient documentation

## 2017-04-17 NOTE — Therapy (Signed)
[place], [place], [address] Phone: [phone]   Fax:  [phone]  Physical Therapy Evaluation  Patient Details  Name: Michael Lindsey MRN: [id number] Date of Birth: 1951/01/30 Referring Provider: Dr Joni Fears   Encounter Date: 04/17/2017      PT End of Session - 04/17/17 0912    Visit Number 1   Number of Visits 12   Date for PT Re-Evaluation 05/29/17   Authorization Type Medicare BCBS supplement    PT Start Time 0800   PT Stop Time 0844   PT Time Calculation (min) 44 min   Activity Tolerance Patient tolerated treatment well   Behavior During Therapy [facility] for tasks assessed/performed      Past Medical History:  Diagnosis Date  . Allergy   . Fracture    L arm @ 10; 3 ribs with shoulder separation & PTX 2003  . Gastritis 09/13/2007   EGD  . GERD (gastroesophageal reflux disease)   . Hepatitis A 1972   from exposure to septic tank which malfunctioned  . Internal hemorrhoids 09/13/2007   COLON  . Osteoporosis     Past Surgical History:  Procedure Laterality Date  . cataract surgery  10/2014  . COLONOSCOPY  2011   Dr Sharlett Iles  . HERNIA REPAIR    . SKIN CANCER EXCISION  2011   R calf, Dr Jarome Matin  . UPPER GASTROINTESTINAL ENDOSCOPY  2003 & 2008    H pylori 2008    There were no vitals filed for this visit.       Subjective Assessment - 04/17/17 0812    Subjective Patient reports around the end of last year he was doing the butterfly in the pool when he began to have posterior shoulder pain. The pain comes on when he is doing activity. He has to stop what he is doing and move around a bit until it subsides. He feels like both shoulders are dislocationg.    Limitations Lifting  Swimming   Diagnostic tests None taken    Patient Stated Goals To get back in the pool and start siwimming agian.    Currently in Pain? Yes   Pain Score 5    Pain Location Shoulder   Pain  Orientation Right;Left   Pain Descriptors / Indicators Aching   Pain Type Acute pain   Pain Onset More than a month ago   Pain Frequency Constant   Aggravating Factors  swimming,    Pain Relieving Factors rest    Effect of Pain on Daily Activities can not swim   Multiple Pain Sites Yes            [facility] PT Assessment - 04/17/17 0001      Assessment   Medical Diagnosis Bilateral shoulder pain    Referring Provider Dr Joni Fears    Next MD Visit None Schedulued    Prior Therapy For the shoulders       Precautions   Precautions --   Precaution Comments Vagal respose from needles per patient      Restrictions   Weight Bearing Restrictions No     Balance Screen   Has the patient fallen in the past 6 months Yes   How many times? 1  only time he has fallen; had a cramp int he night    Has the patient had a decrease in activity level because of a fear of falling?  No   Is the patient reluctant to  leave their home because of a fear of falling?  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   Leisure Swiming      Cognition   Overall Cognitive Status Within Functional Limits for tasks assessed     Observation/Other Assessments   Observations Fall risk     Sensation   Light Touch Appears Intact   Additional Comments Denies parathesias      Coordination   Gross Motor Movements are Fluid and Coordinated Yes   Fine Motor Movements are Fluid and Coordinated Yes     Posture/Postural Control   Posture Comments Rounded shoulders; Forward head      ROM / Strength   AROM / PROM / Strength AROM;PROM;Strength     AROM   Overall AROM Comments full active ROM bilateral      PROM   Overall PROM Comments limited passive ROM bilateral      Strength   Overall Strength Comments mid trap 4/5 bilateral rhomboids 4+/5 bilateral Low trap 4/5 bilateral    Strength  Assessment Site Shoulder   Right/Left Shoulder Right;Left   Right Shoulder Flexion 5/5   Right Shoulder Internal Rotation 4+/5   Left Shoulder Flexion 5/5     Palpation   Palpation comment trigger point around the right teres major; tenderenss to palpation in bilateral bicpes groove.      Special Tests    Special Tests Rotator Cuff Impingement;Laxity/Instability Tests;Biceps/Labral Tests   Rotator Cuff Impingment tests Michel Bickers test;Empty Can test   Laxity/Instability  Load and shift test;Anterior Apprehension test   Biceps/Labral tests Speeds Test     Hawkins-Kennedy test   Side Right  bilateral    Comments Negative     Empty Can test   Findings Negative   Side --  bilateral      Load and Shift test    Findings Negative   Side Right  Lt     Anterior Apprehension test   Findings Negative   Side Right  and Lt      Speeds test   findings Positive   Side Right            Objective measurements completed on examination: See above findings.          [facility] Adult PT Treatment/Exercise - 04/17/17 0001      Shoulder Exercises: Standing   External Rotation Limitations green 2x10    Internal Rotation Limitations green 2x10    Other Standing Exercises dynamic hug green 2x10;    Other Standing Exercises push-up plus at the wall; trigger point release to posterior shoulder      Modalities   Modalities Iontophoresis     Iontophoresis   Type of Iontophoresis Dexamethasone   Location Bilateral bicpes tendon area    Dose 1cc    Time slow release                 PT Education - 04/17/17 0912    Education provided Yes   Education Details HEP; rationale behind rotator cuff strengthening   Person(s) Educated Patient   Methods Explanation;Demonstration;Tactile cues;Verbal cues;Handout   Comprehension Verbalized understanding;Returned demonstration;Verbal cues required;Tactile cues required;Need further instruction          PT Short Term Goals  - 04/17/17 0910      PT SHORT TERM GOAL #1   Title Patient will report 2/10  pain at worst in bilateral shoulders    Time 3   Period Weeks   Status New     PT SHORT TERM GOAL #2   Title Patient will increase bilateral scpaular strength to 4+/5   Time 4   Period Weeks   Status New     PT SHORT TERM GOAL #3   Title Patient will be independent with inital HEP    Time 4   Period Weeks   Status New           PT Long Term Goals - 04/17/17 0914      PT LONG TERM GOAL #1   Title Patient will return to swimming without bilateral shoulder pain    Time 6   Period Weeks   Status New     PT LONG TERM GOAL #2   Title Patient will be independent with a program that improves scapular and rotator cuff strength    Time 6   Period Weeks   Status New     PT LONG TERM GOAL #3   Title Patient will demonstrate a 27% limitation on FOTO    Time 6   Period Weeks   Status New                Plan - 04/17/17 1219    Clinical Impression Statement Patient is a 66 year old male who presents with bilateral posterior and anterior shoulder pain when he swims. The pain started with the butterfly stroke. He has a large trigger point in the area of his terres major on the right and a smaller one on the left. He likely had a rotator cuff strain from swimming whcih has truned into a trigger point. He also has bilateral tenderness in his biceps groove. He had no aprehension with posterior or anterior and no instability felt. He was given posterior chain strengthening 2nd to round shoulders. Therapy also treated the bicpes tendinitis with ionto.    History and Personal Factors relevant to plan of care: anterior shoulder pain in the past    Clinical Presentation Evolving   Clinical Presentation due to: increasing pain with activity    Clinical Decision Making Low   Rehab Potential Excellent   PT Frequency 2x / week   PT Duration 6 weeks   PT Treatment/Interventions ADLs/Self Care Home  Management;Splinting;Taping;Iontophoresis 4mg /ml Dexamethasone;Electrical Stimulation;Patient/family education;Stair training;Gait training;Moist Heat;Ultrasound;Therapeutic activities;Therapeutic exercise;Neuromuscular re-education;Manual techniques;Passive range of motion;Dry needling   PT Next Visit Plan Continue with scpaular  stability exercises; Consider quadruped strengthening; Standing shoulder flexion and scpation; ER IR with abduction for return to swimming all if the patient tolerates initial exercises well.    PT Home Exercise Plan dynamic hug; t-band ER/IR; push up plus; trigger point release to upper trap ( already doing rows and horizontal abduction at home)   Consulted and Agree with Plan of Care Patient      Patient will benefit from skilled therapeutic intervention in order to improve the following deficits and impairments:  Decreased strength, Pain, Increased muscle spasms, Decreased activity tolerance  Visit Diagnosis: Chronic right shoulder pain - Plan: PT plan of care cert/re-cert  Chronic left shoulder pain - Plan: PT plan of care cert/re-cert  Other muscle spasm - Plan: PT plan of care cert/re-cert  Muscle weakness (generalized) - Plan: PT plan of care cert/re-cert      G-Codes - 20/25/42 7062    Functional Limitation Carrying, moving and handling objects   Carrying, Moving and Handling Objects Current  Status [phone]) At least 20 percent but less than 40 percent impaired, limited or restricted   Carrying, Moving and Handling Objects Goal Status (U1324) At least 1 percent but less than 20 percent impaired, limited or restricted       Problem List Patient Active Problem List   Diagnosis Date Noted  . PAF (paroxysmal atrial fibrillation) ([place]) 11/17/2016  . Medicare annual wellness visit, subsequent 10/20/2016  . Palpitations 09/24/2016  . Dyspnea 09/24/2016  . Fatigue 09/24/2016  . Eczema 11/03/2013  . Allergic rhinitis 06/01/2013  . Skin cancer 08/11/2012   . Dysphagia 12/08/2007  . Osteopenia 09/14/2007  . Esophageal reflux 04/13/2007    Carney Living  PT DPT  04/17/2017, 12:30 PM  [facility] [address] [place], [place], [address] Phone: [phone]   Fax:  [phone]  Name: Michael Lindsey MRN: [id number] Date of Birth: [birth year]

## 2017-04-21 DIAGNOSIS — D2271 Melanocytic nevi of right lower limb, including hip: Secondary | ICD-10-CM | POA: Diagnosis not present

## 2017-04-21 DIAGNOSIS — D1801 Hemangioma of skin and subcutaneous tissue: Secondary | ICD-10-CM | POA: Diagnosis not present

## 2017-04-21 DIAGNOSIS — L814 Other melanin hyperpigmentation: Secondary | ICD-10-CM | POA: Diagnosis not present

## 2017-04-21 DIAGNOSIS — I788 Other diseases of capillaries: Secondary | ICD-10-CM | POA: Diagnosis not present

## 2017-04-21 DIAGNOSIS — Z85828 Personal history of other malignant neoplasm of skin: Secondary | ICD-10-CM | POA: Diagnosis not present

## 2017-04-21 DIAGNOSIS — B351 Tinea unguium: Secondary | ICD-10-CM | POA: Diagnosis not present

## 2017-04-21 DIAGNOSIS — L821 Other seborrheic keratosis: Secondary | ICD-10-CM | POA: Diagnosis not present

## 2017-04-27 ENCOUNTER — Ambulatory Visit: Payer: Medicare Other

## 2017-04-27 DIAGNOSIS — M62838 Other muscle spasm: Secondary | ICD-10-CM | POA: Diagnosis not present

## 2017-04-27 DIAGNOSIS — M25512 Pain in left shoulder: Secondary | ICD-10-CM

## 2017-04-27 DIAGNOSIS — M6281 Muscle weakness (generalized): Secondary | ICD-10-CM | POA: Diagnosis not present

## 2017-04-27 DIAGNOSIS — M25511 Pain in right shoulder: Principal | ICD-10-CM

## 2017-04-27 DIAGNOSIS — G8929 Other chronic pain: Secondary | ICD-10-CM | POA: Diagnosis not present

## 2017-04-27 NOTE — Therapy (Signed)
[place], [place], [address] Phone: [phone]   Fax:  [phone]  Physical Therapy Treatment  Patient Details  Name: Michael Lindsey MRN: [id number] Date of Birth: August 28, 1951 Referring Provider: Dr Joni Fears   Encounter Date: 04/27/2017      PT End of Session - 04/27/17 0708    Visit Number 2   Number of Visits 12   Date for PT Re-Evaluation 05/29/17   Authorization Type Medicare BCBS supplement    PT Start Time 0710  late   PT Stop Time 0750   PT Time Calculation (min) 40 min   Activity Tolerance Patient tolerated treatment well   Behavior During Therapy [facility] for tasks assessed/performed      Past Medical History:  Diagnosis Date  . Allergy   . Fracture    L arm @ 10; 3 ribs with shoulder separation & PTX 2003  . Gastritis 09/13/2007   EGD  . GERD (gastroesophageal reflux disease)   . Hepatitis A 1972   from exposure to septic tank which malfunctioned  . Internal hemorrhoids 09/13/2007   COLON  . Osteoporosis     Past Surgical History:  Procedure Laterality Date  . cataract surgery  10/2014  . COLONOSCOPY  2011   Dr Sharlett Iles  . HERNIA REPAIR    . SKIN CANCER EXCISION  2011   R calf, Dr Jarome Matin  . UPPER GASTROINTESTINAL ENDOSCOPY  2003 & 2008    H pylori 2008    There were no vitals filed for this visit.      Subjective Assessment - 04/27/17 0709    Subjective No pain now. This weekend dull achy pain posterior shoulders to triceps. Mild.    Currently in Pain? No/denies                         [facility] Adult PT Treatment/Exercise - 04/27/17 0001      Shoulder Exercises: Standing   Other Standing Exercises reveiewed all HEP with band  green x 15 then blue x 10 reps. , Cat camel x 10 , then Leg pull in prone x10 5 sec hold    then breast stroke prep pilates x 12   then  12 reps  swimmer with arm and leg lefts   Other Standing Exercises push up plus in  quadraped x 12                  PT Education - 04/27/17 0713    Education provided (P)  Yes   Education Details (P)  HEP   Person(s) Educated (P)  Patient   Methods (P)  Explanation;Demonstration;Verbal cues;Handout   Comprehension (P)  Verbalized understanding;Returned demonstration          PT Short Term Goals - 04/27/17 0756      PT SHORT TERM GOAL #1   Title Patient will report 2/10 pain at worst in bilateral shoulders    Status On-going     PT SHORT TERM GOAL #2   Title Patient will increase bilateral scpaular strength to 4+/5   Status On-going     PT SHORT TERM GOAL #3   Title Patient will be independent with inital HEP    Status Achieved           PT Long Term Goals - 04/17/17 0914      PT LONG TERM GOAL #1   Title Patient will return to swimming without  bilateral shoulder pain    Time 6   Period Weeks   Status New     PT LONG TERM GOAL #2   Title Patient will be independent with a program that improves scapular and rotator cuff strength    Time 6   Period Weeks   Status New     PT LONG TERM GOAL #3   Title Patient will demonstrate a 27% limitation on FOTO    Time 6   Period Weeks   Status New               Plan - 04/27/17 8206    Clinical Impression Statement No pain today and declned modalites. Added Pilates sca;ula/extension strength with cues for  recruitment of scapula with arm lift.     PT Treatment/Interventions ADLs/Self Care Home Management;Splinting;Taping;Iontophoresis 4mg /ml Dexamethasone;Electrical Stimulation;Patient/family education;Stair training;Gait training;Moist Heat;Ultrasound;Therapeutic activities;Therapeutic exercise;Neuromuscular re-education;Manual techniques;Passive range of motion;Dry needling   PT Next Visit Plan Continue with scpaular  stability exercises; Consider quadruped strengthening; Standing shoulder flexion and scpation; ER IR with abduction for return to swimming all if the patient tolerates  initial exercises well.    PT Home Exercise Plan dynamic hug; t-band ER/IR; push up plus; trigger point release to upper trap ( already doing rows and horizontal abduction at home) Pilates , swan, pre breast stroke, leg pull in quadraped   Consulted and Agree with Plan of Care Patient      Patient will benefit from skilled therapeutic intervention in order to improve the following deficits and impairments:  Decreased strength, Pain, Increased muscle spasms, Decreased activity tolerance  Visit Diagnosis: Chronic right shoulder pain  Chronic left shoulder pain  Other muscle spasm  Muscle weakness (generalized)     Problem List Patient Active Problem List   Diagnosis Date Noted  . PAF (paroxysmal atrial fibrillation) ([place]) 11/17/2016  . Medicare annual wellness visit, subsequent 10/20/2016  . Palpitations 09/24/2016  . Dyspnea 09/24/2016  . Fatigue 09/24/2016  . Eczema 11/03/2013  . Allergic rhinitis 06/01/2013  . Skin cancer 08/11/2012  . Dysphagia 12/08/2007  . Osteopenia 09/14/2007  . Esophageal reflux 04/13/2007    Michael Lindsey  PT 04/27/2017, 7:57 AM  [facility] [address] [place], [place], [address] Phone: [phone]   Fax:  [phone]  Name: Michael Lindsey MRN: [id number] Date of Birth: 1950-12-31

## 2017-04-27 NOTE — Patient Instructions (Addendum)
Issued from [employer]  Pilates booklet  Pre breast stroke and swan exercises and leg pull prone quadraped .  1x/day 10-20 reps 2-3 sec hold

## 2017-04-29 ENCOUNTER — Encounter (HOSPITAL_BASED_OUTPATIENT_CLINIC_OR_DEPARTMENT_OTHER): Payer: Medicare Other

## 2017-05-04 ENCOUNTER — Ambulatory Visit: Payer: Medicare Other | Admitting: Physician Assistant

## 2017-05-05 ENCOUNTER — Ambulatory Visit: Payer: Medicare Other | Admitting: Physical Therapy

## 2017-05-05 ENCOUNTER — Encounter: Payer: Self-pay | Admitting: Physical Therapy

## 2017-05-05 DIAGNOSIS — M25511 Pain in right shoulder: Secondary | ICD-10-CM | POA: Diagnosis not present

## 2017-05-05 DIAGNOSIS — G8929 Other chronic pain: Secondary | ICD-10-CM

## 2017-05-05 DIAGNOSIS — M25512 Pain in left shoulder: Secondary | ICD-10-CM

## 2017-05-05 DIAGNOSIS — M6281 Muscle weakness (generalized): Secondary | ICD-10-CM

## 2017-05-05 DIAGNOSIS — M62838 Other muscle spasm: Secondary | ICD-10-CM | POA: Diagnosis not present

## 2017-05-05 NOTE — Therapy (Signed)
[place], [place], [address] Phone: [phone]   Fax:  [phone]  Physical Therapy Treatment  Patient Details  Name: Michael Lindsey MRN: [id number] Date of Birth: 09-Jul-1951 Referring Provider: Dr Joni Fears   Encounter Date: 05/05/2017      PT End of Session - 05/05/17 0841    Visit Number 3   Number of Visits 12   Date for PT Re-Evaluation 05/29/17   PT Start Time 0800   PT Stop Time 0841   PT Time Calculation (min) 41 min   Activity Tolerance Patient tolerated treatment well   Behavior During Therapy [facility] for tasks assessed/performed      Past Medical History:  Diagnosis Date  . Allergy   . Fracture    L arm @ 10; 3 ribs with shoulder separation & PTX 2003  . Gastritis 09/13/2007   EGD  . GERD (gastroesophageal reflux disease)   . Hepatitis A 1972   from exposure to septic tank which malfunctioned  . Internal hemorrhoids 09/13/2007   COLON  . Osteoporosis     Past Surgical History:  Procedure Laterality Date  . cataract surgery  10/2014  . COLONOSCOPY  2011   Dr Sharlett Iles  . HERNIA REPAIR    . SKIN CANCER EXCISION  2011   R calf, Dr Jarome Matin  . UPPER GASTROINTESTINAL ENDOSCOPY  2003 & 2008    H pylori 2008    There were no vitals filed for this visit.      Subjective Assessment - 05/05/17 0804    Subjective "Things have going well no discomort, except for once episode with quick reaching"   Currently in Pain? No/denies   Pain Score 0-No pain   Pain Onset More than a month ago   Aggravating Factors  quick reaching                          [facility] Adult PT Treatment/Exercise - 05/05/17 0819      Shoulder Exercises: Supine   Protraction 15 reps  with tactile cues to increase protraction     Shoulder Exercises: Prone   Other Prone Exercises T and I's while laying prone on the physioball 2 x10, 2 x 12 "W" exercise 1#     Shoulder Exercises:  Standing   Protraction Strengthening;Both;10 reps  with tactile cues to facilitate push     Shoulder Exercises: Stretch   Other Shoulder Stretches 3 way bicep stretch holding for 30 sec ea.   bil     Shoulder Exercises: Body Blade   Other Body Blade Exercises IR/ER bil 3 x 30 sec   Other Body Blade Exercises protraction 3 x 30                 PT Education - 05/05/17 0840    Education provided Yes   Education Details updated HEP for bicep stretch   Person(s) Educated Patient   Methods Explanation;Verbal cues;Handout   Comprehension Verbalized understanding;Verbal cues required          PT Short Term Goals - 04/27/17 0756      PT SHORT TERM GOAL #1   Title Patient will report 2/10 pain at worst in bilateral shoulders    Status On-going     PT SHORT TERM GOAL #2   Title Patient will increase bilateral scpaular strength to 4+/5   Status On-going     PT SHORT TERM GOAL #3  Title Patient will be independent with inital HEP    Status Achieved           PT Long Term Goals - 04/17/17 0914      PT LONG TERM GOAL #1   Title Patient will return to swimming without bilateral shoulder pain    Time 6   Period Weeks   Status New     PT LONG TERM GOAL #2   Title Patient will be independent with a program that improves scapular and rotator cuff strength    Time 6   Period Weeks   Status New     PT LONG TERM GOAL #3   Title Patient will demonstrate a 27% limitation on FOTO    Time 6   Period Weeks   Status New               Plan - 05/05/17 9417    Clinical Impression Statement pt reported no pain and that he was doing well. added stretching for the biceps which was added to his HEP. continued scapular stability exercises which he did well and reported no pain post session.    PT Next Visit Plan Continue with scpaular  stability exercises; Consider quadruped strengthening; Standing shoulder flexion and scpation; ER IR with abduction for return to  swimming all if the patient tolerates initial exercises well.    PT Home Exercise Plan dynamic hug; t-band ER/IR; push up plus; trigger point release to upper trap ( already doing rows and horizontal abduction at home) Pilates , swan, pre breast stroke, leg pull in quadraped, bicep stretch.    Consulted and Agree with Plan of Care Patient      Patient will benefit from skilled therapeutic intervention in order to improve the following deficits and impairments:  Decreased strength, Pain, Increased muscle spasms, Decreased activity tolerance  Visit Diagnosis: Chronic right shoulder pain  Chronic left shoulder pain  Other muscle spasm  Muscle weakness (generalized)     Problem List Patient Active Problem List   Diagnosis Date Noted  . PAF (paroxysmal atrial fibrillation) ([place]) 11/17/2016  . Medicare annual wellness visit, subsequent 10/20/2016  . Palpitations 09/24/2016  . Dyspnea 09/24/2016  . Fatigue 09/24/2016  . Eczema 11/03/2013  . Allergic rhinitis 06/01/2013  . Skin cancer 08/11/2012  . Dysphagia 12/08/2007  . Osteopenia 09/14/2007  . Esophageal reflux 04/13/2007   [place] PT, DPT, LAT, ATC  05/05/17  8:46 AM      [facility] [address] [place], [place], [address] Phone: [phone]   Fax:  [phone]  Name: Michael Lindsey MRN: [id number] Date of Birth: 04-Apr-1951

## 2017-05-11 ENCOUNTER — Ambulatory Visit: Payer: Medicare Other | Attending: Family | Admitting: Physical Therapy

## 2017-05-11 ENCOUNTER — Encounter: Payer: Self-pay | Admitting: Physical Therapy

## 2017-05-11 DIAGNOSIS — M62838 Other muscle spasm: Secondary | ICD-10-CM | POA: Insufficient documentation

## 2017-05-11 DIAGNOSIS — G8929 Other chronic pain: Secondary | ICD-10-CM | POA: Diagnosis not present

## 2017-05-11 DIAGNOSIS — M6281 Muscle weakness (generalized): Secondary | ICD-10-CM | POA: Diagnosis not present

## 2017-05-11 DIAGNOSIS — M25511 Pain in right shoulder: Secondary | ICD-10-CM | POA: Diagnosis not present

## 2017-05-11 DIAGNOSIS — M25512 Pain in left shoulder: Secondary | ICD-10-CM | POA: Insufficient documentation

## 2017-05-11 NOTE — Therapy (Signed)
[place], [place], [address] Phone: [phone]   Fax:  [phone]  Physical Therapy Treatment  Patient Details  Name: Michael Lindsey MRN: [id number] Date of Birth: 10-10-1950 Referring Provider: Dr Joni Fears   Encounter Date: 05/11/2017      PT End of Session - 05/11/17 0807    Visit Number 4   Number of Visits 12   Date for PT Re-Evaluation 05/29/17   Authorization Type Medicare BCBS supplement    PT Start Time 0800   PT Stop Time 0840   PT Time Calculation (min) 40 min   Activity Tolerance Patient tolerated treatment well   Behavior During Therapy [facility] for tasks assessed/performed      Past Medical History:  Diagnosis Date  . Allergy   . Fracture    L arm @ 11; 3 ribs with shoulder separation & PTX 2003  . Gastritis 09/13/2007   EGD  . GERD (gastroesophageal reflux disease)   . Hepatitis A 1972   from exposure to septic tank which malfunctioned  . Internal hemorrhoids 09/13/2007   COLON  . Osteoporosis     Past Surgical History:  Procedure Laterality Date  . cataract surgery  10/2014  . COLONOSCOPY  2011   Dr Sharlett Iles  . HERNIA REPAIR    . SKIN CANCER EXCISION  2011   R calf, Dr Jarome Matin  . UPPER GASTROINTESTINAL ENDOSCOPY  2003 & 2008    H pylori 2008    There were no vitals filed for this visit.      Subjective Assessment - 05/11/17 0804    Subjective Patient reports he was leaning over yesterday and felt like his shoulder seperated. He did not have much pain. He is having no pain today.    Limitations Lifting   Diagnostic tests None taken    Patient Stated Goals To get back in the pool and start siwimming agian.    Currently in Pain? No/denies   Pain Score 5    Pain Location Shoulder   Pain Orientation Right;Left   Pain Descriptors / Indicators Aching   Pain Type Acute pain   Pain Onset More than a month ago   Pain Frequency Constant   Aggravating Factors   quick reaching    Pain Relieving Factors rest    Effect of Pain on Daily Activities can not swim                          [facility] Adult PT Treatment/Exercise - 05/11/17 0001      Neck Exercises: Machines for Strengthening   Cybex Row 20x 35 lb each grip; lat pull down x20 2lb      Shoulder Exercises: Supine   Protraction Limitations 2x10 2lb    Other Supine Exercises D2 flexion 3lb; supine flexion 2x10;      Shoulder Exercises: Seated   Other Seated Exercises shoulder press 6 lb 2x10 with cuing for technique i     Shoulder Exercises: Prone   Other Prone Exercises quadruped alt UE/LE x10 alt UE x10; ball cross under and out x10 bilateral 2x10;      Shoulder Exercises: Standing   Other Standing Exercises standing band clock x5 each arm; wall walk 2x10;      Shoulder Exercises: ROM/Strengthening   UBE (Upper Arm Bike) L2 3 min forw  2 back  PT Education - 05/11/17 0806    Education provided Yes   Education Details reviewed HEP    Person(s) Educated Patient   Methods Explanation;Demonstration;Tactile cues;Verbal cues   Comprehension Verbalized understanding;Returned demonstration          PT Short Term Goals - 05/11/17 0841      PT SHORT TERM GOAL #1   Title Patient will report 2/10 pain at worst in bilateral shoulders    Time 3   Period Weeks   Status On-going     PT SHORT TERM GOAL #2   Title Patient will increase bilateral scpaular strength to 4+/5   Time 4   Period Weeks   Status On-going     PT SHORT TERM GOAL #3   Title Patient will be independent with inital HEP    Time 4   Period Weeks   Status On-going           PT Long Term Goals - 04/17/17 0914      PT LONG TERM GOAL #1   Title Patient will return to swimming without bilateral shoulder pain    Time 6   Period Weeks   Status New     PT LONG TERM GOAL #2   Title Patient will be independent with a program that improves scapular and rotator cuff strength     Time 6   Period Weeks   Status New     PT LONG TERM GOAL #3   Title Patient will demonstrate a 27% limitation on FOTO    Time 6   Period Weeks   Status New               Plan - 05/11/17 0809    Clinical Impression Statement Patienyt tolerated treatment well. He had no significant increase in pain with exercises. Next visit therapy will review exercises with the patient and likley D?C to HEP.    Clinical Presentation Stable   Clinical Decision Making Low   Rehab Potential Excellent   PT Frequency 2x / week   PT Duration 6 weeks   PT Treatment/Interventions ADLs/Self Care Home Management;Splinting;Taping;Iontophoresis 4mg /ml Dexamethasone;Electrical Stimulation;Patient/family education;Stair training;Gait training;Moist Heat;Ultrasound;Therapeutic activities;Therapeutic exercise;Neuromuscular re-education;Manual techniques;Passive range of motion;Dry needling   PT Next Visit Plan Continue with scpaular  stability exercises; Consider quadruped strengthening; Standing shoulder flexion and scpation; ER IR with abduction for return to swimming all if the patient tolerates initial exercises well. FOTO.    PT Home Exercise Plan dynamic hug; t-band ER/IR; push up plus; trigger point release to upper trap ( already doing rows and horizontal abduction at home) Pilates , swan, pre breast stroke, leg pull in quadraped, bicep stretch.    Consulted and Agree with Plan of Care Patient      Patient will benefit from skilled therapeutic intervention in order to improve the following deficits and impairments:  Decreased strength, Pain, Increased muscle spasms, Decreased activity tolerance  Visit Diagnosis: Chronic right shoulder pain  Chronic left shoulder pain  Other muscle spasm  Muscle weakness (generalized)     Problem List Patient Active Problem List   Diagnosis Date Noted  . PAF (paroxysmal atrial fibrillation) ([place]) 11/17/2016  . Medicare annual wellness visit, subsequent  10/20/2016  . Palpitations 09/24/2016  . Dyspnea 09/24/2016  . Fatigue 09/24/2016  . Eczema 11/03/2013  . Allergic rhinitis 06/01/2013  . Skin cancer 08/11/2012  . Dysphagia 12/08/2007  . Osteopenia 09/14/2007  . Esophageal reflux 04/13/2007    Carney Living PT DPT  05/11/2017, 8:48 AM  [place], [place], [address] Phone: [phone]   Fax:  [phone]  Name: Michael Lindsey MRN: [id number] Date of Birth: 07-17-1951

## 2017-05-13 ENCOUNTER — Ambulatory Visit (HOSPITAL_BASED_OUTPATIENT_CLINIC_OR_DEPARTMENT_OTHER): Payer: Medicare Other | Attending: Interventional Cardiology | Admitting: Cardiology

## 2017-05-13 VITALS — Ht 72.0 in | Wt 170.0 lb

## 2017-05-13 DIAGNOSIS — R0683 Snoring: Secondary | ICD-10-CM

## 2017-05-13 DIAGNOSIS — G4719 Other hypersomnia: Secondary | ICD-10-CM

## 2017-05-13 DIAGNOSIS — I480 Paroxysmal atrial fibrillation: Secondary | ICD-10-CM

## 2017-05-13 DIAGNOSIS — I493 Ventricular premature depolarization: Secondary | ICD-10-CM | POA: Insufficient documentation

## 2017-05-14 ENCOUNTER — Other Ambulatory Visit (HOSPITAL_BASED_OUTPATIENT_CLINIC_OR_DEPARTMENT_OTHER): Payer: Self-pay

## 2017-05-14 DIAGNOSIS — I480 Paroxysmal atrial fibrillation: Secondary | ICD-10-CM

## 2017-05-15 ENCOUNTER — Encounter: Payer: Self-pay | Admitting: Physical Therapy

## 2017-05-15 ENCOUNTER — Ambulatory Visit: Payer: Medicare Other | Admitting: Physical Therapy

## 2017-05-15 DIAGNOSIS — M62838 Other muscle spasm: Secondary | ICD-10-CM | POA: Diagnosis not present

## 2017-05-15 DIAGNOSIS — G8929 Other chronic pain: Secondary | ICD-10-CM

## 2017-05-15 DIAGNOSIS — M25512 Pain in left shoulder: Secondary | ICD-10-CM

## 2017-05-15 DIAGNOSIS — M6281 Muscle weakness (generalized): Secondary | ICD-10-CM

## 2017-05-15 DIAGNOSIS — M25511 Pain in right shoulder: Principal | ICD-10-CM

## 2017-05-16 NOTE — Procedures (Signed)
   Patient Name: Michael Lindsey, Michael Lindsey Date: 05/13/2017 Gender: Male D.O.B: May 17, 1951 Age (years): 66 Referring Provider: Larae Grooms Height (inches): 72 Interpreting Physician: Fransico Him MD, ABSM Weight (lbs): 170 RPSGT: Jorge Ny BMI: 23 MRN: [id number] Neck Size: 16.00  CLINICAL INFORMATION Sleep Study Type: NPSG  Indication for sleep study: Snoring  Epworth Sleepiness Score: 11  SLEEP STUDY TECHNIQUE As per the AASM Manual for the Scoring of Sleep and Associated Events v2.3 (April 2016) with a hypopnea requiring 4% desaturations.  The channels recorded and monitored were frontal, central and occipital EEG, electrooculogram (EOG), submentalis EMG (chin), nasal and oral airflow, thoracic and abdominal wall motion, anterior tibialis EMG, snore microphone, electrocardiogram, and pulse oximetry.  MEDICATIONS Medications self-administered by patient taken the night of the study : N/A  SLEEP ARCHITECTURE The study was initiated at 10:41:40 PM and ended at 5:18:49 AM.  Sleep onset time was 15.2 minutes and the sleep efficiency was 79.9%. The total sleep time was 317.4 minutes.  Stage REM latency was 40.5 minutes.  The patient spent 2.99% of the night in stage N1 sleep, 64.58% in stage N2 sleep, 0.00% in stage N3 and 32.43% in REM.  Alpha intrusion was absent.  Supine sleep was 88.34%.  RESPIRATORY PARAMETERS The overall apnea/hypopnea index (AHI) was 3.6 per hour. There were 12 total apneas, including 12 obstructive, 0 central and 0 mixed apneas. There were 7 hypopneas and 70 RERAs.  The AHI during Stage REM sleep was 8.7 per hour.  AHI while supine was 4.1 per hour.  The mean oxygen saturation was 94.38%. The minimum SpO2 during sleep was 90.00%.  Loud snoring was noted during this study.  CARDIAC DATA The 2 lead EKG demonstrated sinus rhythm. The mean heart rate was 62.60 beats per minute. Other EKG findings include: PVCs.  LEG MOVEMENT  DATA The total PLMS were 68 with a resulting PLMS index of 12.85. Associated arousal with leg movement index was 0.2 .  IMPRESSIONS - No significant obstructive sleep apnea occurred during this study (AHI = 3.6/h). - No significant central sleep apnea occurred during this study (CAI = 0.0/h). - The patient had minimal or no oxygen desaturation during the study (Min O2 = 90.00%) - The patient snored with Loud snoring volume. - Cardiac rhythm included PVCs. - Mild periodic limb movements of sleep occurred during the study. No significant associated arousals.  DIAGNOSIS - Normal study - PVCs  RECOMMENDATIONS - Avoid alcohol, sedatives and other CNS depressants that may worsen sleep apnea and disrupt normal sleep architecture. - Sleep hygiene should be reviewed to assess factors that may improve sleep quality. - Weight management and regular exercise should be initiated or continued if appropriate.  [place], American Board of Sleep Medicine  ELECTRONICALLY SIGNED ON:  05/16/2017, 6:51 PM [place] PH: [phone]   FX: [phone] [place]

## 2017-05-18 ENCOUNTER — Encounter: Payer: Self-pay | Admitting: Physical Therapy

## 2017-05-18 NOTE — Therapy (Signed)
[place], [place], [address] Phone: [phone]   Fax:  [phone]  Physical Therapy Treatment/ Discharge   Patient Details  Name: Michael Lindsey MRN: [id number] Date of Birth: Feb 18, 1951 Referring Provider: Dr Joni Fears   Encounter Date: 05/15/2017      PT End of Session - 05/18/17 1353    Visit Number 5   Number of Visits 12   Date for PT Re-Evaluation 05/29/17   Authorization Type Medicare BCBS supplement    PT Start Time 0800   PT Stop Time 0839   PT Time Calculation (min) 39 min   Activity Tolerance Patient tolerated treatment well   Behavior During Therapy [facility] for tasks assessed/performed      Past Medical History:  Diagnosis Date  . Allergy   . Fracture    L arm @ 11; 3 ribs with shoulder separation & PTX 2003  . Gastritis 09/13/2007   EGD  . GERD (gastroesophageal reflux disease)   . Hepatitis A 1972   from exposure to septic tank which malfunctioned  . Internal hemorrhoids 09/13/2007   COLON  . Osteoporosis     Past Surgical History:  Procedure Laterality Date  . cataract surgery  10/2014  . COLONOSCOPY  2011   Dr Sharlett Iles  . HERNIA REPAIR    . SKIN CANCER EXCISION  2011   R calf, Dr Jarome Matin  . UPPER GASTROINTESTINAL ENDOSCOPY  2003 & 2008    H pylori 2008    There were no vitals filed for this visit.                       [facility] Adult PT Treatment/Exercise - 05/18/17 0001      Neck Exercises: Machines for Strengthening   Cybex Row 20x 35 lb each grip; lat pull down x20 2lb      Shoulder Exercises: Supine   Protraction Limitations 2x10 2lb    Other Supine Exercises D2 flexion 3lb; supine flexion 2x10; REviewed shoulder exercises supine ith band.      Shoulder Exercises: Seated   Other Seated Exercises shoulder press 6 lb 2x10 with cuing for technique i     Shoulder Exercises: Prone   Other Prone Exercises quadruped alt UE/LE x10 alt UE  x10; ball cross under and out x10 bilateral 2x10;      Shoulder Exercises: Standing   Other Standing Exercises standing band clock x5 each arm; wall walk 2x10;                 PT Education - 05/18/17 1352    Education provided Yes   Education Details continue with HEP    Person(s) Educated Patient   Methods Explanation;Demonstration;Tactile cues;Verbal cues   Comprehension Verbalized understanding;Returned demonstration          PT Short Term Goals - 05/18/17 1355      PT SHORT TERM GOAL #1   Title Patient will report 2/10 pain at worst in bilateral shoulders    Time 3   Period Weeks   Status Achieved     PT SHORT TERM GOAL #2   Title Patient will increase bilateral scpaular strength to 4+/5   Time 4   Period Weeks   Status Achieved     PT SHORT TERM GOAL #3   Title Patient will be independent with inital HEP    Time 4   Period Weeks   Status Achieved  PT Long Term Goals - 06/10/2017 1357      PT LONG TERM GOAL #1   Title Patient will return to swimming without bilateral shoulder pain    Status Achieved     PT LONG TERM GOAL #2   Title Patient will be independent with a program that improves scapular and rotator cuff strength    Time 6   Period Weeks   Status Achieved     PT LONG TERM GOAL #3   Title Patient will demonstrate a 27% limitation on FOTO    Baseline 27%   Time 6   Period Weeks   Status Achieved               Plan - 06-10-2017 1353    Clinical Impression Statement Patient has proegressed wel. He is comfortable with his HEP. He has reached all goals for therapy. D/C to HEP.    History and Personal Factors relevant to plan of care: anterior shoulder pain    Clinical Presentation Stable   Clinical Decision Making Low   Rehab Potential Excellent   PT Frequency 2x / week   PT Duration 6 weeks   PT Treatment/Interventions ADLs/Self Care Home Management;Splinting;Taping;Iontophoresis 78m/ml Dexamethasone;Electrical  Stimulation;Patient/family education;Stair training;Gait training;Moist Heat;Ultrasound;Therapeutic activities;Therapeutic exercise;Neuromuscular re-education;Manual techniques;Passive range of motion;Dry needling   PT Next Visit Plan Continue with scpaular  stability exercises; Consider quadruped strengthening; Standing shoulder flexion and scpation; ER IR with abduction for return to swimming all if the patient tolerates initial exercises well. FOTO.    PT Home Exercise Plan dynamic hug; t-band ER/IR; push up plus; trigger point release to upper trap ( already doing rows and horizontal abduction at home) Pilates , swan, pre breast stroke, leg pull in quadraped, bicep stretch.    Consulted and Agree with Plan of Care Patient      Patient will benefit from skilled therapeutic intervention in order to improve the following deficits and impairments:  Decreased strength, Pain, Increased muscle spasms, Decreased activity tolerance  Visit Diagnosis: Chronic right shoulder pain  Chronic left shoulder pain  Other muscle spasm  Muscle weakness (generalized)       G-Codes - [id number]    Functional Assessment Tool Used (Outpatient Only) clinical decision making    Functional Limitation Carrying, moving and handling objects   Carrying, Moving and Handling Objects Current Status ((Q2595 At least 1 percent but less than 20 percent impaired, limited or restricted   Carrying, Moving and Handling Objects Goal Status ((G3875 At least 1 percent but less than 20 percent impaired, limited or restricted   Carrying, Moving and Handling Objects Discharge Status ([id number] At least 1 percent but less than 20 percent impaired, limited or restricted      Problem List Patient Active Problem List   Diagnosis Date Noted  . PAF (paroxysmal atrial fibrillation) ([place] 11/17/2016  . Medicare annual wellness visit, subsequent 10/20/2016  . Palpitations 09/24/2016  . Dyspnea 09/24/2016  . Fatigue 09/24/2016  .  Eczema 11/03/2013  . Allergic rhinitis 06/01/2013  . Skin cancer 08/11/2012  . Dysphagia 12/08/2007  . Osteopenia 09/14/2007  . Esophageal reflux 04/13/2007   PHYSICAL THERAPY DISCHARGE SUMMARY  Visits from Start of Care: 5 Current functional level related to goals / functional outcomes: Returned to swimming and exercising   Remaining deficits: None  Education / Equipment: HEP Plan: Patient agrees to discharge.  Patient goals were not met. Patient is being discharged due to meeting the stated rehab goals.  ?????  Carney Living  PT DPT  05/18/2017, 1:58 PM  [facility] [address] [place], [place], [address] Phone: [phone]   Fax:  [phone]  Name: Michael Lindsey MRN: [id number] Date of Birth: [birth year]

## 2017-07-27 NOTE — Progress Notes (Signed)
Cardiology Office Note   Date:  07/28/2017   ID:  Michael Lindsey, DOB Feb 14, 1951, MRN [id number]  PCP:  [place], FNP    No chief complaint on file. PAF   Wt Readings from Last 3 Encounters:  07/28/17 172 lb 6.4 oz (78.2 kg)  05/13/17 170 lb (77.1 kg)  02/03/17 170 lb (77.1 kg)       History of Present Illness: Michael Lindsey is a 66 y.o. male  Who had palpitations and was found to have PAF.  His wife was watching him for sleep apnea.  She has not seen any. He does snore, worse with alcohol consumption.  His watch recorded some fast heart rate during sleep on one night.   Sleep study was done in 8/18.    He continues to exercise with weights and cardio.  He does a spin class and plays tennis.       Past Medical History:  Diagnosis Date  . Allergy   . Fracture    L arm @ 11; 3 ribs with shoulder separation & PTX 2003  . Gastritis 09/13/2007   EGD  . GERD (gastroesophageal reflux disease)   . Hepatitis A 1972   from exposure to septic tank which malfunctioned  . Internal hemorrhoids 09/13/2007   COLON  . Osteoporosis     Past Surgical History:  Procedure Laterality Date  . cataract surgery  10/2014  . COLONOSCOPY  2011   Dr Sharlett Iles  . HERNIA REPAIR    . SKIN CANCER EXCISION  2011   R calf, Dr Jarome Matin  . UPPER GASTROINTESTINAL ENDOSCOPY  2003 & 2008    H pylori 2008     Current Outpatient Prescriptions  Medication Sig Dispense Refill  . acetaminophen (TYLENOL) 325 MG tablet Take 650 mg by mouth daily as needed for mild pain, moderate pain or headache (pain).     . Ascorbic Acid (VITAMIN C) 100 MG tablet Take 100 mg by mouth daily.    Marland Kitchen aspirin EC 81 MG tablet Take 2 tablets (162 mg total) by mouth daily. 180 tablet 3  . cholecalciferol (VITAMIN D) 1000 UNITS tablet Take 1,000 Units by mouth daily.    . ciclopirox (PENLAC) 8 % solution Apply 1 application topically daily.  2  . Flaxseed, Linseed, (FLAXSEED OIL) 1000 MG CAPS Take by  mouth as directed.     . Potassium (POTASSIMIN PO) Take 1 tablet by mouth daily.    . ranitidine (ZANTAC) 150 MG tablet Take 150 mg by mouth as needed for heartburn (Take as directed).     . thiamine (VITAMIN B-1) 100 MG tablet Take 100 mg by mouth daily.    . vitamin E 100 UNIT capsule Take 100 Units by mouth daily.      No current facility-administered medications for this visit.     Allergies:   Chlorine    Social History:  The patient  reports that he quit smoking about 36 years ago. He has never used smokeless tobacco. He reports that he does not drink alcohol or use drugs.   Family History:  The patient's family history includes Alcohol abuse in his paternal uncle; Colon cancer (age of onset: 94) in his father; Coronary artery disease in his father; Depression in his brother and mother; Emphysema in his father; Tuberculosis in his mother.    ROS:  Please see the history of present illness.   Otherwise, review of systems are positive for snoring per the  wife.   All other systems are reviewed and negative.    PHYSICAL EXAM: VS:  BP 124/66   Pulse 61   Ht 6' (1.829 m)   Wt 172 lb 6.4 oz (78.2 kg)   SpO2 97%   BMI 23.38 kg/m  , BMI Body mass index is 23.38 kg/m. GEN: Well nourished, well developed, in no acute distress  HEENT: normal  Neck: no JVD, carotid bruits, or masses Cardiac: RRR; no murmurs, rubs, or gallops,no edema  Respiratory:  clear to auscultation bilaterally, normal work of breathing GI: soft, nontender, nondistended, + BS MS: no deformity or atrophy  Skin: warm and dry, no rash Neuro:  Strength and sensation are intact Psych: euthymic mood, full affect   EKG:   The ekg ordered today demonstrates normal sinus rhythm, no ST segment changes   Recent Labs: 09/24/2016: ALT 15; BUN 13; Creatinine, Ser 0.86; Hemoglobin 15.4; Platelets 239.0; Potassium 4.3; Sodium 138; TSH 2.07   Lipid Panel    Component Value Date/Time   CHOL 150 10/20/2016 1021   TRIG  70.0 10/20/2016 1021   HDL 64.10 10/20/2016 1021   CHOLHDL 2 10/20/2016 1021   VLDL 14.0 10/20/2016 1021   LDLCALC 72 10/20/2016 1021     Other studies Reviewed: Additional studies/ records that were reviewed today with results demonstrating: sleep apnea report reviewed from 8/18: normal sleep study, PVCs noted.; loud snoring present   ASSESSMENT AND PLAN:  1. PAF: Maintaining NSR.  COntinue aspirin.  No evidence of sleep apnea.  Let us know if there are new symptoms.  Explained that PVCs were benign.  No sustained arrhythmia noted.  This patients CHA2DS2-VASc Score and unadjusted Ischemic Stroke Rate (% per year) is equal to 0.6 % stroke rate/year from a score of 1  Above score calculated as 1 point each if present [CHF, HTN, DM, Vascular=MI/PAD/Aortic Plaque, Age if 65-74, or Male] Above score calculated as 2 points each if present [Age > 75, or Stroke/TIA/TE]      Current medicines are reviewed at length with the patient today.  The patient concerns regarding his medicines were addressed.  The following changes have been made:  No change  Labs/ tests ordered today include:  No orders of the defined types were placed in this encounter.   Recommend 150 minutes/week of aerobic exercise Low fat, low carb, high fiber diet recommended  Disposition:   FU in 1 year   Signed, Larae Grooms, MD  07/28/2017 9:09 AM    [facility] [place], [place],   [address] Phone: [phone]; Fax: [phone]

## 2017-07-28 ENCOUNTER — Encounter: Payer: Self-pay | Admitting: Interventional Cardiology

## 2017-07-28 ENCOUNTER — Ambulatory Visit (INDEPENDENT_AMBULATORY_CARE_PROVIDER_SITE_OTHER): Payer: Medicare Other | Admitting: Interventional Cardiology

## 2017-07-28 VITALS — BP 124/66 | HR 61 | Ht 72.0 in | Wt 172.4 lb

## 2017-07-28 DIAGNOSIS — I480 Paroxysmal atrial fibrillation: Secondary | ICD-10-CM | POA: Diagnosis not present

## 2017-07-28 NOTE — Patient Instructions (Signed)

## 2017-08-09 DIAGNOSIS — Z23 Encounter for immunization: Secondary | ICD-10-CM | POA: Diagnosis not present

## 2017-08-19 ENCOUNTER — Encounter: Payer: Self-pay | Admitting: Nurse Practitioner

## 2017-08-19 ENCOUNTER — Ambulatory Visit (INDEPENDENT_AMBULATORY_CARE_PROVIDER_SITE_OTHER): Payer: Medicare Other | Admitting: Nurse Practitioner

## 2017-08-19 VITALS — BP 110/66 | HR 64 | Temp 97.7°F | Ht 72.0 in | Wt 171.0 lb

## 2017-08-19 DIAGNOSIS — H9193 Unspecified hearing loss, bilateral: Secondary | ICD-10-CM | POA: Diagnosis not present

## 2017-08-19 NOTE — Progress Notes (Signed)
Subjective:  Patient ID: Michael Lindsey, male    DOB: 1951/08/26  Age: 66 y.o. MRN: [id number]  CC: Hearing Loss (request referral to for ear doctor for hearing loss, has trouble when there is a room full/ Dr. Valda Favia appt 09/04/2017. )   Ear Fullness   There is pain in both ears. This is a chronic problem. The current episode started more than 1 year ago. The problem occurs constantly. The problem has been unchanged. There has been no fever. The patient is experiencing no pain. Associated symptoms include hearing loss. Pertinent negatives include no abdominal pain, coughing, diarrhea, ear discharge, headaches, neck pain, rash, rhinorrhea, sore throat or vomiting. Associated symptoms comments: Intermittent tinnitus. He has tried nothing for the symptoms.   No FHx of hearing loss  He wants referral faxed to Dr. Lonzo Candy.  Outpatient Medications Prior to Visit  Medication Sig Dispense Refill  . acetaminophen (TYLENOL) 325 MG tablet Take 650 mg by mouth daily as needed for mild pain, moderate pain or headache (pain).     . Ascorbic Acid (VITAMIN C) 100 MG tablet Take 100 mg by mouth daily.    Marland Kitchen aspirin EC 81 MG tablet Take 2 tablets (162 mg total) by mouth daily. 180 tablet 3  . cholecalciferol (VITAMIN D) 1000 UNITS tablet Take 1,000 Units by mouth daily.    . Flaxseed, Linseed, (FLAXSEED OIL) 1000 MG CAPS Take by mouth as directed.     . Potassium (POTASSIMIN PO) Take 1 tablet by mouth daily.    . ranitidine (ZANTAC) 150 MG tablet Take 150 mg by mouth as needed for heartburn (Take as directed).     . thiamine (VITAMIN B-1) 100 MG tablet Take 100 mg by mouth daily.    . vitamin E 100 UNIT capsule Take 100 Units by mouth daily.     . ciclopirox (PENLAC) 8 % solution Apply 1 application topically daily.  2   No facility-administered medications prior to visit.     ROS See HPI  Objective:  BP 110/66   Pulse 64   Temp 97.7 F (36.5 C)   Ht 6' (1.829 m)   Wt 171 lb (77.6 kg)    SpO2 98%   BMI 23.19 kg/m   BP Readings from Last 3 Encounters:  08/19/17 110/66  07/28/17 124/66  02/03/17 111/66    Wt Readings from Last 3 Encounters:  08/19/17 171 lb (77.6 kg)  07/28/17 172 lb 6.4 oz (78.2 kg)  05/13/17 170 lb (77.1 kg)    Physical Exam  Constitutional: No distress.  HENT:  Right Ear: Tympanic membrane, external ear and ear canal normal.  Left Ear: Tympanic membrane, external ear and ear canal normal.  Nose: Nose normal.  Abnormal weber and Rhine with lateralization to left.  Cardiovascular: Normal rate.  Pulmonary/Chest: Effort normal.  Vitals reviewed.   Lab Results  Component Value Date   WBC 7.8 09/24/2016   HGB 15.4 09/24/2016   HCT 44.7 09/24/2016   PLT 239.0 09/24/2016   GLUCOSE 90 09/24/2016   CHOL 150 10/20/2016   TRIG 70.0 10/20/2016   HDL 64.10 10/20/2016   LDLCALC 72 10/20/2016   ALT 15 09/24/2016   AST 18 09/24/2016   NA 138 09/24/2016   K 4.3 09/24/2016   CL 102 09/24/2016   CREATININE 0.86 09/24/2016   BUN 13 09/24/2016   CO2 31 09/24/2016   TSH 2.07 09/24/2016   PSA 0.52 10/20/2016    Dg Chest 2 View  Result  Date: 09/24/2016 CLINICAL DATA:  Dyspnea and palpitations since yesterday. EXAM: CHEST  2 VIEW COMPARISON:  None. FINDINGS: The heart size is normal. There is no edema or effusion. Emphysematous changes are noted. The visualized soft tissues and bony thorax are otherwise unremarkable. IMPRESSION: 1. Emphysema. 2. No acute cardiopulmonary disease. Electronically Signed   By: San Morelle M.D.   On: 09/24/2016 15:19    Assessment & Plan:   Laban was seen today for hearing loss.  Diagnoses and all orders for this visit:  Bilateral hearing loss, unspecified hearing loss type -     Ambulatory referral to Audiology   I am having Nigel Bridgeman. Biggins maintain his acetaminophen, ranitidine, vitamin C, cholecalciferol, thiamine, Flaxseed Oil, vitamin E, Potassium (POTASSIMIN PO), aspirin EC, and  ciclopirox.  No orders of the defined types were placed in this encounter.   Follow-up: No Follow-up on file.  Wilfred Lacy, NP

## 2017-08-21 ENCOUNTER — Ambulatory Visit: Payer: Medicare Other | Admitting: Family Medicine

## 2017-08-31 DIAGNOSIS — Z01818 Encounter for other preprocedural examination: Secondary | ICD-10-CM | POA: Diagnosis not present

## 2017-08-31 DIAGNOSIS — I4891 Unspecified atrial fibrillation: Secondary | ICD-10-CM | POA: Diagnosis not present

## 2017-08-31 DIAGNOSIS — R42 Dizziness and giddiness: Secondary | ICD-10-CM | POA: Diagnosis not present

## 2017-08-31 DIAGNOSIS — R918 Other nonspecific abnormal finding of lung field: Secondary | ICD-10-CM | POA: Diagnosis not present

## 2017-08-31 DIAGNOSIS — R55 Syncope and collapse: Secondary | ICD-10-CM | POA: Diagnosis not present

## 2017-08-31 DIAGNOSIS — Z8679 Personal history of other diseases of the circulatory system: Secondary | ICD-10-CM | POA: Diagnosis not present

## 2017-08-31 DIAGNOSIS — I499 Cardiac arrhythmia, unspecified: Secondary | ICD-10-CM | POA: Diagnosis not present

## 2017-09-01 ENCOUNTER — Telehealth: Payer: Self-pay | Admitting: Interventional Cardiology

## 2017-09-01 NOTE — Telephone Encounter (Signed)
New message  Pt c/o Syncope: STAT if syncope occurred within 30 minutes and pt complains of lightheadedness High Priority if episode of passing out, completely, today or in last 24 hours   1. Did you pass out today? 08/31/2017 issue at [place] airport   2. When is the last time you passed out? Lightheaded close to passing out  3. Has this occurred multiple times? Years ago  4. Did you have any symptoms prior to passing out? no

## 2017-09-01 NOTE — Telephone Encounter (Signed)
Returned call to patient stating that he was at the airport yesterday and started feeling faint while standing in line. Patient stated he went to the ER. They did an EKG which was normal and CBG was good. He stated his HR did drop down to 50 bpm at one moment. He states he feels that he was just dehydrated from the holidays and running around with his grandkids. Patient states he feels better today. Patient denied any lightheadedness, but states he still feels a little weak. Informed patient I would send to Dr. Irish Lack and his nurse. Patient verbalized understanding and thanked me for the call.

## 2017-09-02 NOTE — Telephone Encounter (Signed)
No further w/u for this episode.  WOuld just stay hydrated and continue current meds.

## 2017-09-03 NOTE — Telephone Encounter (Signed)
Left message with the patient's receptionist for him to call back.

## 2017-09-04 DIAGNOSIS — H903 Sensorineural hearing loss, bilateral: Secondary | ICD-10-CM | POA: Diagnosis not present

## 2017-09-04 NOTE — Telephone Encounter (Signed)
Left detailed message on patient's VM (DPR on file) letting the patient know that there was no further work up needed at this time. Instructed for the patient to stay hydrated, continue current meds, and to let us know if his symptoms worsen or return. Instructed for the patient to call back with any questions.

## 2017-09-17 ENCOUNTER — Encounter: Payer: Self-pay | Admitting: Internal Medicine

## 2017-10-09 ENCOUNTER — Telehealth: Payer: Self-pay | Admitting: Interventional Cardiology

## 2017-10-09 NOTE — Telephone Encounter (Signed)
New Message   Patient c/o Palpitations:  High priority if patient c/o lightheadedness, shortness of breath, or chest pain  1) How long have you had palpitations/irregular HR/ Afib? Are you having the symptoms now? At least 4 days on and off. Tuesday night and today  2) Are you currently experiencing lightheadedness, SOB or CP? no  3) Do you have a history of afib (atrial fibrillation) or irregular heart rhythm? Yes, afib  4) Have you checked your BP or HR? (document readings if available): HR readings 156 the highest 47 the lowest  5) Are you experiencing any other symptoms? Patient states that about a week ago he felt like he was going to pass out. But he contributed to being in a hot room.

## 2017-10-09 NOTE — Telephone Encounter (Signed)
Patient calling and states that he has been experiencing intermittent episodes of irregular heart beats since Tuesday. He states that his HR according to his fit bit was 47-156. He states denies having any chest pain or being SOB, but states that he has noticed his heart being irregular. He states that sometimes when it it happens that it is difficult to take a deep breath. He states that he had an episode Tuesday night and then was fine Wednesday (played tennis) and Thursday night and then had another episode this morning. Patient denies any symptoms at this time. Patient states that he would like to come in for an appointment. Patient also wanting to report that before Christmas when he was in [place] he had an episode where he felt dizzy like he was going to pass out. He states that he had on his coat and he got too hot and thought he was dehydrated. He was seen in the ER and was given fluids. He states that it was the same situation the next weekend when he was in [place]. Patient scheduled with Dr. Irish Lack on Monday 10/12/17 at 8:20 AM. Patient advised to stay hydrated. ER precautions reviewed with the patient who verbalized understanding.

## 2017-10-12 ENCOUNTER — Encounter: Payer: Self-pay | Admitting: Interventional Cardiology

## 2017-10-12 ENCOUNTER — Ambulatory Visit (INDEPENDENT_AMBULATORY_CARE_PROVIDER_SITE_OTHER): Payer: Medicare Other | Admitting: Interventional Cardiology

## 2017-10-12 VITALS — BP 134/62 | HR 71 | Ht 72.0 in | Wt 173.0 lb

## 2017-10-12 DIAGNOSIS — I480 Paroxysmal atrial fibrillation: Secondary | ICD-10-CM

## 2017-10-12 DIAGNOSIS — R002 Palpitations: Secondary | ICD-10-CM

## 2017-10-12 DIAGNOSIS — R42 Dizziness and giddiness: Secondary | ICD-10-CM | POA: Diagnosis not present

## 2017-10-12 MED ORDER — METOPROLOL TARTRATE 25 MG PO TABS: 12.5000 mg | ORAL_TABLET | Freq: Two times a day (BID) | ORAL | 3 refills | Status: DC

## 2017-10-12 NOTE — Patient Instructions (Signed)
Medication Instructions:  Your physician has recommended you make the following change in your medication:   START: metoprolol 12.5 mg (1/2 tablet) twice a day  Labwork: None ordered  Testing/Procedures: None ordered  Follow-Up: You have been referred to EP for Atrial Fibrillation.   Any Other Special Instructions Will Be Listed Below (If Applicable).     If you need a refill on your cardiac medications before your next appointment, please call your pharmacy.

## 2017-10-12 NOTE — Progress Notes (Signed)
Cardiology Office Note   Date:  10/12/2017   ID:  Michael Lindsey, DOB 1951/06/30, MRN [id number]  PCP:  Golden Circle, FNP    No chief complaint on file.  AFib  Wt Readings from Last 3 Encounters:  10/12/17 173 lb (78.5 kg)  08/19/17 171 lb (77.6 kg)  07/28/17 172 lb 6.4 oz (78.2 kg)       History of Present Illness: Michael Lindsey is a 67 y.o. male   Who had palpitations and was found to have PAF.  His wife was watching him for sleep apnea.  She has not seen any. He does snore, worse with alcohol consumption.  His watch recorded some fast heart rate during sleep on one night.   Sleep study was done in 8/18.     Since the last visit, he has had 2 episodes of lightheadedness.  He did not pass out.  He went to the ER nad was treated with IV fluids.  He was in  Line at the airport and was very hot.  It resolved after he got fluid and was evaluated by EMTs.   He was seen in the ER in [place].  It resolved.   Before Christmas, he was doing some Christmas shopping and got warm and felt lightheaded.    In the past week, he has had several episodes of AFib.  He could feel the palpitations.  HR on his watch was 157 (high) nad 46 was the low. He was not exercising at the time.  Denies : Chest pain. Dizziness. Leg edema. Nitroglycerin use. Orthopnea. Paroxysmal nocturnal dyspnea. Shortness of breath. Syncope.   He continues to exercise with weights and cardio.  He does a spin class and plays tennis indoors in the cold as of last week.       Past Medical History:  Diagnosis Date  . Allergy   . Fracture    L arm @ 10; 3 ribs with shoulder separation & PTX 2003  . Gastritis 09/13/2007   EGD  . GERD (gastroesophageal reflux disease)   . Hepatitis A 1972   from exposure to septic tank which malfunctioned  . Internal hemorrhoids 09/13/2007   COLON  . Osteoporosis     Past Surgical History:  Procedure Laterality Date  . cataract surgery  10/2014  . COLONOSCOPY   2011   Dr Sharlett Iles  . HERNIA REPAIR    . SKIN CANCER EXCISION  2011   R calf, Dr Jarome Matin  . UPPER GASTROINTESTINAL ENDOSCOPY  2003 & 2008    H pylori 2008     Current Outpatient Medications  Medication Sig Dispense Refill  . acetaminophen (TYLENOL) 325 MG tablet Take 650 mg by mouth daily as needed for mild pain, moderate pain or headache (pain).     . Ascorbic Acid (VITAMIN C) 100 MG tablet Take 100 mg by mouth daily.    Marland Kitchen aspirin EC 81 MG tablet Take 2 tablets (162 mg total) by mouth daily. 180 tablet 3  . cholecalciferol (VITAMIN D) 1000 UNITS tablet Take 1,000 Units by mouth daily.    . ciclopirox (PENLAC) 8 % solution Apply 1 application topically daily.  2  . Flaxseed, Linseed, (FLAXSEED OIL) 1000 MG CAPS Take by mouth as directed.     . Potassium (POTASSIMIN PO) Take 1 tablet by mouth daily.    . ranitidine (ZANTAC) 150 MG tablet Take 150 mg by mouth as needed for heartburn (Take as directed).     Marland Kitchen  thiamine (VITAMIN B-1) 100 MG tablet Take 100 mg by mouth daily.    . vitamin E 100 UNIT capsule Take 100 Units by mouth daily.      No current facility-administered medications for this visit.     Allergies:   Chlorine    Social History:  The patient  reports that he quit smoking about 37 years ago. he has never used smokeless tobacco. He reports that he does not drink alcohol or use drugs.   Family History:  The patient's family history includes Alcohol abuse in his paternal uncle; Colon cancer (age of onset: 4) in his father; Coronary artery disease in his father; Depression in his brother and mother; Emphysema in his father; Tuberculosis in his mother.    ROS:  Please see the history of present illness.   Otherwise, review of systems are positive for palpitations.   All other systems are reviewed and negative.    PHYSICAL EXAM: VS:  BP 134/62   Pulse 71   Ht 6' (1.829 m)   Wt 173 lb (78.5 kg)   SpO2 98%   BMI 23.46 kg/m  , BMI Body mass index is 23.46  kg/m. GEN: Well nourished, well developed, in no acute distress  HEENT: normal  Neck: no JVD, carotid bruits, or masses Cardiac: RRR; no murmurs, rubs, or gallops,no edema  Respiratory:  clear to auscultation bilaterally, normal work of breathing GI: soft, nontender, nondistended, + BS MS: no deformity or atrophy  Skin: warm and dry, no rash Neuro:  Strength and sensation are intact Psych: euthymic mood, full affect   EKG:   The ekg ordered today demonstrates normal ECG   Recent Labs: No results found for requested labs within last 8760 hours.   Lipid Panel    Component Value Date/Time   CHOL 150 10/20/2016 1021   TRIG 70.0 10/20/2016 1021   HDL 64.10 10/20/2016 1021   CHOLHDL 2 10/20/2016 1021   VLDL 14.0 10/20/2016 1021   LDLCALC 72 10/20/2016 1021     Other studies Reviewed: Additional studies/ records that were reviewed today with results demonstrating: .   ASSESSMENT AND PLAN:  1. PAF:  Refer to EP.  He is very active and would like to avoid medicines.  If he is intolerant of medications, would have to consider ablation.  He is willing to try low-dose metoprolol.  We will start 12.5 mg p.o. twice daily.  We discussed monitor, but he would like to hold off.  He had a tough time with adhesive pads the last time we did a monitor.  Would also consider ILR for monitoring of atrial fibrillation.  Continue aspirin.  CHADS-VASC is 1. 2. Lightheadedness: I encouraged him to stay well-hydrated. 3. He has labs coming up with his primary care physician.  His TSH should be rechecked given the recurrent symptoms of atrial fibrillation.   Current medicines are reviewed at length with the patient today.  The patient concerns regarding his medicines were addressed.  The following changes have been made:  No change  Labs/ tests ordered today include:  No orders of the defined types were placed in this encounter.   Recommend 150 minutes/week of aerobic exercise Low fat, low  carb, high fiber diet recommended  Disposition:   FU in after EP evaluation   Signed, Larae Grooms, MD  10/12/2017 8:32 AM    [address], [place], [place]  [address] Phone: [phone]; Fax: [phone]

## 2017-10-26 ENCOUNTER — Encounter: Payer: Self-pay | Admitting: Internal Medicine

## 2017-10-26 ENCOUNTER — Ambulatory Visit (INDEPENDENT_AMBULATORY_CARE_PROVIDER_SITE_OTHER): Payer: Medicare Other | Admitting: Internal Medicine

## 2017-10-26 VITALS — BP 124/64 | HR 66 | Ht 72.0 in | Wt 171.0 lb

## 2017-10-26 DIAGNOSIS — I480 Paroxysmal atrial fibrillation: Secondary | ICD-10-CM | POA: Diagnosis not present

## 2017-10-26 NOTE — Progress Notes (Signed)
Electrophysiology Office Note   Date:  10/26/2017   ID:  Michael Lindsey, DOB [birth year], MRN [id number]  PCP:  Golden Circle, FNP  Cardiologist:  Dr Beau Fanny Primary Electrophysiologist: Thompson Grayer, MD    CC: afib   History of Present Illness: Michael Lindsey is a 67 y.o. male who presents today for electrophysiology evaluation.   He is referred by Dr Beau Fanny for EP consultation regarding afib.  He was originally diagnosed with atrial fibrillation 09/2016 after wearing an event monitor (personally reviewed) which revealed rate controlled afib.  In retrospect, he has had similar symptoms for several years.   He did well until having afib with palpitations in July of 2018.  He has also had afib in early January of this year.  He feels that episodes typically last only a couple hours.  He thinks that stress and ETOh may be triggers.  He is very active and exercises daily without limitation.  Today, he denies symptoms of chest pain, shortness of breath, orthopnea, PND, lower extremity edema, claudication, dizziness, presyncope, syncope, bleeding, or neurologic sequela. The patient is tolerating medications without difficulties and is otherwise without complaint today.    Past Medical History:  Diagnosis Date  . Fracture    L arm @ 10; 3 ribs with shoulder separation & PTX 2003  . Gastritis 09/13/2007   EGD  . GERD (gastroesophageal reflux disease)   . Hepatitis A 1972   from exposure to septic tank which malfunctioned  . Internal hemorrhoids 09/13/2007   COLON  . Osteoporosis   . Paroxysmal atrial fibrillation (HCC)    chads2vasc score is 1   Past Surgical History:  Procedure Laterality Date  . cataract surgery  10/2014  . COLONOSCOPY  2011   Dr Sharlett Iles  . HERNIA REPAIR    . SKIN CANCER EXCISION  2011   R calf, Dr Jarome Matin  . UPPER GASTROINTESTINAL ENDOSCOPY  2003 & 2008    H pylori 2008     Current Outpatient Medications  Medication Sig Dispense Refill  .  acetaminophen (TYLENOL) 325 MG tablet Take 650 mg by mouth daily as needed for mild pain, moderate pain or headache (pain).     . Ascorbic Acid (VITAMIN C) 100 MG tablet Take 100 mg by mouth daily.    Marland Kitchen aspirin EC 81 MG tablet Take 2 tablets (162 mg total) by mouth daily. 180 tablet 3  . cholecalciferol (VITAMIN D) 1000 UNITS tablet Take 1,000 Units by mouth daily.    . Flaxseed, Linseed, (FLAXSEED OIL) 1000 MG CAPS Take by mouth as directed.     . metoprolol tartrate (LOPRESSOR) 25 MG tablet Take 0.5 tablets (12.5 mg total) by mouth 2 (two) times daily. 90 tablet 3  . ranitidine (ZANTAC) 150 MG tablet Take 150 mg by mouth as needed for heartburn (Take as directed).     . thiamine (VITAMIN B-1) 100 MG tablet Take 100 mg by mouth daily.    . vitamin E 100 UNIT capsule Take 100 Units by mouth daily.      No current facility-administered medications for this visit.     Allergies:   Chlorine   Social History:  The patient  reports that he quit smoking about 37 years ago. he has never used smokeless tobacco. He reports that he does not drink alcohol or use drugs.   Family History:  The patient's  family history includes Alcohol abuse in his paternal uncle; Colon cancer (age of onset: 6) in  his father; Coronary artery disease in his father; Depression in his brother and mother; Emphysema in his father; Tuberculosis in his mother.    ROS:  Please see the history of present illness.   All other systems are personally reviewed and negative.    PHYSICAL EXAM: VS:  BP 124/64   Pulse 66   Ht 6' (1.829 m)   Wt 171 lb (77.6 kg)   BMI 23.19 kg/m  , BMI Body mass index is 23.19 kg/m. GEN: Well nourished, well developed, in no acute distress  HEENT: normal  Neck: no JVD, carotid bruits, or masses Cardiac: RRR; no murmurs, rubs, or gallops,no edema  Respiratory:  clear to auscultation bilaterally, normal work of breathing GI: soft, nontender, nondistended, + BS MS: no deformity or atrophy  Skin:  warm and dry  Neuro:  Strength and sensation are intact Psych: euthymic mood, full affect  EKG:  EKG is ordered today. The ekg ordered today is personally reviewed and shows sinus rhythm 66 bpm, PR 174 msec, QRS 94 msec, QTc 404 msec   Recent Labs: No results found for requested labs within last 8760 hours.  personally reviewed   Lipid Panel     Component Value Date/Time   CHOL 150 10/20/2016 1021   TRIG 70.0 10/20/2016 1021   HDL 64.10 10/20/2016 1021   CHOLHDL 2 10/20/2016 1021   VLDL 14.0 10/20/2016 1021   LDLCALC 72 10/20/2016 1021   personally reviewed   Wt Readings from Last 3 Encounters:  10/26/17 171 lb (77.6 kg)  10/12/17 173 lb (78.5 kg)  08/19/17 171 lb (77.6 kg)      Other studies personally reviewed: Additional studies/ records that were reviewed today include: echo 09/2016 was normal , Dr Hassell Done notes Review of the above records today demonstrates: as above   ASSESSMENT AND PLAN:  1.  paroxysmal atrial fibrillation The patient has symptomatic afib.  Episodes remain rather short and infrequent.  He is tolerating low dose metoprolol.  I have advised that he can take an additional 12.5mg  of metoprolol if he has afib episodes. Therapeutic strategies for afib including medicine and ablation were discussed in detail with the patient today. At this time, he would favor a conservative approach.  We discussed lifestyle modification today.  If his afib episodes increase, he may consider additional options then.  We also discussed Apple Watch v 4 (he has version 3) or implantable loop recorder to monitor afib burden.  For now, he wishes to continue his current approach. chads2vasc score is 1.  We discussed AVERROES trial which showed favorable benefit of patients with low chads2vasc score of eliquis.  He wishes to stay with ASA.  Per his request, he will follow-up with Dr Irish Lack. I am happy to see him at any time. I have also given him the AF clinic number should  he need urgent help with his afib.   Current medicines are reviewed at length with the patient today.   The patient does not have concerns regarding his medicines.  The following changes were made today:  none  Labs/ tests ordered today include:  Orders Placed This Encounter  Procedures  . EKG 12-Lead     Signed, Thompson Grayer, MD  10/26/2017 9:21 AM     [address] [place]  [address] [phone] (office) [phone] (fax)

## 2017-10-26 NOTE — Patient Instructions (Signed)
Medication Instructions:  Your physician recommends that you continue on your current medications as directed. Please refer to the Current Medication list given to you today.  Labwork: None ordered.  Testing/Procedures: None ordered.  Follow-Up: Your physician recommends that you schedule a follow-up appointment as needed.   Any Other Special Instructions Will Be Listed Below (If Applicable).     If you need a refill on your cardiac medications before your next appointment, please call your pharmacy.   

## 2017-10-28 ENCOUNTER — Other Ambulatory Visit (INDEPENDENT_AMBULATORY_CARE_PROVIDER_SITE_OTHER): Payer: Medicare Other

## 2017-10-28 ENCOUNTER — Encounter: Payer: Self-pay | Admitting: Nurse Practitioner

## 2017-10-28 ENCOUNTER — Ambulatory Visit (INDEPENDENT_AMBULATORY_CARE_PROVIDER_SITE_OTHER): Payer: Medicare Other | Admitting: Nurse Practitioner

## 2017-10-28 VITALS — BP 110/68 | HR 57 | Temp 97.7°F | Resp 16 | Ht 72.0 in | Wt 171.8 lb

## 2017-10-28 DIAGNOSIS — Z125 Encounter for screening for malignant neoplasm of prostate: Secondary | ICD-10-CM

## 2017-10-28 DIAGNOSIS — Z13220 Encounter for screening for lipoid disorders: Secondary | ICD-10-CM

## 2017-10-28 DIAGNOSIS — R4582 Worries: Secondary | ICD-10-CM | POA: Diagnosis not present

## 2017-10-28 DIAGNOSIS — I480 Paroxysmal atrial fibrillation: Secondary | ICD-10-CM | POA: Diagnosis not present

## 2017-10-28 DIAGNOSIS — Z0000 Encounter for general adult medical examination without abnormal findings: Secondary | ICD-10-CM | POA: Diagnosis not present

## 2017-10-28 DIAGNOSIS — Z0001 Encounter for general adult medical examination with abnormal findings: Secondary | ICD-10-CM

## 2017-10-28 DIAGNOSIS — Z23 Encounter for immunization: Secondary | ICD-10-CM | POA: Diagnosis not present

## 2017-10-28 LAB — COMPREHENSIVE METABOLIC PANEL
ALT: 16 U/L (ref 0–53)
AST: 19 U/L (ref 0–37)
Albumin: 3.9 g/dL (ref 3.5–5.2)
Alkaline Phosphatase: 59 U/L (ref 39–117)
BILIRUBIN TOTAL: 0.8 mg/dL (ref 0.2–1.2)
BUN: 11 mg/dL (ref 6–23)
CO2: 25 meq/L (ref 19–32)
Calcium: 8.8 mg/dL (ref 8.4–10.5)
Chloride: 104 mEq/L (ref 96–112)
Creatinine, Ser: 0.88 mg/dL (ref 0.40–1.50)
GFR: 91.61 mL/min (ref >60.00)
GLUCOSE: 89 mg/dL (ref 70–99)
Potassium: 4.2 mEq/L (ref 3.5–5.1)
Sodium: 141 mEq/L (ref 135–145)
Total Protein: 6.4 g/dL (ref 6.0–8.3)

## 2017-10-28 LAB — CBC WITH DIFFERENTIAL/PLATELET
Basophils Absolute: 0 10*3/uL (ref 0.0–0.1)
Basophils Relative: 0.5 % (ref 0.0–3.0)
EOS PCT: 5 % (ref 0.0–5.0)
Eosinophils Absolute: 0.3 10*3/uL (ref 0.0–0.7)
HCT: 44.8 % (ref 39.0–52.0)
HEMOGLOBIN: 15.3 g/dL (ref 13.0–17.0)
LYMPHS PCT: 15.2 % (ref 12.0–46.0)
Lymphs Abs: 1.1 10*3/uL (ref 0.7–4.0)
MCHC: 34.2 g/dL (ref 30.0–36.0)
MCV: 97.5 fl (ref 78.0–100.0)
MONOS PCT: 10.3 % (ref 3.0–12.0)
Monocytes Absolute: 0.7 10*3/uL (ref 0.1–1.0)
Neutro Abs: 4.8 10*3/uL (ref 1.4–7.7)
Neutrophils Relative %: 69 % (ref 43.0–77.0)
Platelets: 211 10*3/uL (ref 150.0–400.0)
RBC: 4.6 Mil/uL (ref 4.22–5.81)
RDW: 13.5 % (ref 11.5–15.5)
WBC: 6.9 10*3/uL (ref 4.0–10.5)

## 2017-10-28 LAB — LIPID PANEL
CHOLESTEROL: 165 mg/dL (ref 0–200)
HDL: 66.2 mg/dL (ref >39.00)
LDL CALC: 85 mg/dL (ref 0–99)
NONHDL: 98.78
Total CHOL/HDL Ratio: 2
Triglycerides: 70 mg/dL (ref 0.0–149.0)
VLDL: 14 mg/dL (ref 0.0–40.0)

## 2017-10-28 LAB — PSA, MEDICARE: PSA: 0.36 ng/mL (ref 0.10–4.00)

## 2017-10-28 LAB — TSH: TSH: 1.91 u[IU]/mL (ref 0.35–4.50)

## 2017-10-28 NOTE — Patient Instructions (Addendum)
Please head downstairs for lab work.  Keep up the good work with healthy diet and exercise.  We will see you back in 1 year, or sooner if you need!  It was nice to meet you. Thanks for letting me take care of you today :)   Preventive Care 65 Years and Older, Male Preventive care refers to lifestyle choices and visits with your health care provider that can promote health and wellness. What does preventive care include?  A yearly physical exam. This is also called an annual well check.  Dental exams once or twice a year.  Routine eye exams. Ask your health care provider how often you should have your eyes checked.  Personal lifestyle choices, including: ? Daily care of your teeth and gums. ? Regular physical activity. ? Eating a healthy diet. ? Avoiding tobacco and drug use. ? Limiting alcohol use. ? Practicing safe sex. ? Taking low doses of aspirin every day. ? Taking vitamin and mineral supplements as recommended by your health care provider. What happens during an annual well check? The services and screenings done by your health care provider during your annual well check will depend on your age, overall health, lifestyle risk factors, and family history of disease. Counseling Your health care provider may ask you questions about your:  Alcohol use.  Tobacco use.  Drug use.  Emotional well-being.  Home and relationship well-being.  Sexual activity.  Eating habits.  History of falls.  Memory and ability to understand (cognition).  Work and work Statistician.  Screening You may have the following tests or measurements:  Height, weight, and BMI.  Blood pressure.  Lipid and cholesterol levels. These may be checked every 5 years, or more frequently if you are over 50 years old.  Skin check.  Lung cancer screening. You may have this screening every year starting at age 36 if you have a 30-pack-year history of smoking and currently smoke or have quit  within the past 15 years.  Fecal occult blood test (FOBT) of the stool. You may have this test every year starting at age 64.  Flexible sigmoidoscopy or colonoscopy. You may have a sigmoidoscopy every 5 years or a colonoscopy every 10 years starting at age 25.  Prostate cancer screening. Recommendations will vary depending on your family history and other risks.  Hepatitis C blood test.  Hepatitis B blood test.  Sexually transmitted disease (STD) testing.  Diabetes screening. This is done by checking your blood sugar (glucose) after you have not eaten for a while (fasting). You may have this done every 1-3 years.  Abdominal aortic aneurysm (AAA) screening. You may need this if you are a current or former smoker.  Osteoporosis. You may be screened starting at age 28 if you are at high risk.  Talk with your health care provider about your test results, treatment options, and if necessary, the need for more tests. Vaccines Your health care provider may recommend certain vaccines, such as:  Influenza vaccine. This is recommended every year.  Tetanus, diphtheria, and acellular pertussis (Tdap, Td) vaccine. You may need a Td booster every 10 years.  Varicella vaccine. You may need this if you have not been vaccinated.  Zoster vaccine. You may need this after age 19.  Measles, mumps, and rubella (MMR) vaccine. You may need at least one dose of MMR if you were born in 1957 or later. You may also need a second dose.  Pneumococcal 13-valent conjugate (PCV13) vaccine. One dose is recommended  after age 63.  Pneumococcal polysaccharide (PPSV23) vaccine. One dose is recommended after age 24.  Meningococcal vaccine. You may need this if you have certain conditions.  Hepatitis A vaccine. You may need this if you have certain conditions or if you travel or work in places where you may be exposed to hepatitis A.  Hepatitis B vaccine. You may need this if you have certain conditions or if you  travel or work in places where you may be exposed to hepatitis B.  Haemophilus influenzae type b (Hib) vaccine. You may need this if you have certain risk factors.  Talk to your health care provider about which screenings and vaccines you need and how often you need them. This information is not intended to replace advice given to you by your health care provider. Make sure you discuss any questions you have with your health care provider. Document Released: 10/19/2015 Document Revised: 06/11/2016 Document Reviewed: 07/24/2015 Elsevier Interactive Patient Education  [employer].

## 2017-10-28 NOTE — Progress Notes (Deleted)
Patient: Michael Lindsey, Male    DOB: 30-Sep-1951, 67 y.o.   MRN: [id number]  Visit Date: 10/28/2017  Today's Provider: Lance Sell, NP   Chief Complaint  Patient presents with  . Establish Care    CPE, fasting    Subjective:   Michael Lindsey is a 67 y.o. male who presents today for his Subsequent Annual Wellness Visit.  Patient/Caregiver input:  Pt presents alone today for annual wellness visit.  HPI  Past Medical History:  Diagnosis Date  . Fracture    L arm @ 10; 3 ribs with shoulder separation & PTX 2003  . Gastritis 09/13/2007   EGD  . GERD (gastroesophageal reflux disease)   . Hepatitis A 1972   from exposure to septic tank which malfunctioned  . Internal hemorrhoids 09/13/2007   COLON  . Osteoporosis   . Paroxysmal atrial fibrillation (HCC)    chads2vasc score is 1    Past Surgical History:  Procedure Laterality Date  . cataract surgery  10/2014  . COLONOSCOPY  2011   Dr Sharlett Iles  . HERNIA REPAIR    . SKIN CANCER EXCISION  2011   R calf, Dr Jarome Matin  . UPPER GASTROINTESTINAL ENDOSCOPY  2003 & 2008    H pylori 2008    Family History  Problem Relation Age of Onset  . Depression Mother   . Tuberculosis Mother   . Coronary artery disease Father   . Emphysema Father   . Colon cancer Father 61  . Depression Brother   . Alcohol abuse Paternal Uncle   . Diabetes Neg Hx   . Stroke Neg Hx     Social History   Socioeconomic History  . Marital status: Married    Spouse name: Not on file  . Number of children: 2  . Years of education: 31  . Highest education level: Not on file  Social Needs  . Financial resource strain: Not on file  . Food insecurity - worry: Not on file  . Food insecurity - inability: Not on file  . Transportation needs - medical: Not on file  . Transportation needs - non-medical: Not on file  Occupational History  . Occupation: IMMIGRATION ATTORNEY  Tobacco Use  . Smoking status: Former Smoker    Last attempt to  quit: 10/06/1980    Years since quitting: 37.0  . Smokeless tobacco: Never Used  . Tobacco comment: intermittent smoker over 4 years in college & again in 1983 before quitting, never > 2 mos @ a time,never > 1/2 ppd  Substance and Sexual Activity  . Alcohol use: No    Alcohol/week: 0.0 oz    Comment: SOCIALLY,  . Drug use: No  . Sexual activity: Not on file  Other Topics Concern  . Not on file  Social History Narrative   GETS REG EXERCISE      Lives in [place]   Works as an Engineer, petroleum       Outpatient Encounter Medications as of 10/28/2017  Medication Sig  . acetaminophen (TYLENOL) 325 MG tablet Take 650 mg by mouth daily as needed for mild pain, moderate pain or headache (pain).   . Ascorbic Acid (VITAMIN C) 100 MG tablet Take 100 mg by mouth daily.  Marland Kitchen aspirin EC 81 MG tablet Take 2 tablets (162 mg total) by mouth daily.  . cholecalciferol (VITAMIN D) 1000 UNITS tablet Take 1,000 Units by mouth daily.  . Flaxseed, Linseed, (FLAXSEED OIL) 1000 MG CAPS Take by mouth as  directed.   . metoprolol tartrate (LOPRESSOR) 25 MG tablet Take 0.5 tablets (12.5 mg total) by mouth 2 (two) times daily.  . ranitidine (ZANTAC) 150 MG tablet Take 150 mg by mouth as needed for heartburn (Take as directed).   . thiamine (VITAMIN B-1) 100 MG tablet Take 100 mg by mouth daily.  . vitamin E 100 UNIT capsule Take 100 Units by mouth daily.    No facility-administered encounter medications on file as of 10/28/2017.     Allergies  Allergen Reactions  . Chlorine     EYES WATER/NASAL CONGESTION    Care Team Updated in EHR: {Yes/No:30480221}  Last Vision Exam: *** Wears corrective lenses: {Yes/No:30480221} Last Dental Exam: *** Last Hearing Exam: *** Wears Hearing Aids: {Yes/No:30480221}  Functional Ability / Safety Screening 1.  Was the timed Get Up and Go test longer than 30 seconds?  {Blank single:19197::"yes","no"} 2.  Does the patient need help with the phone, transportation,  shopping,      preparing meals, housework, laundry, medications, or managing money?  {Blank single:19197::"yes","no"} 3.  Does the patient's home have:  loose throw rugs in the hallway?   {Blank single:19197::"yes","no"}      Grab bars in the bathroom? {Blank single:19197::"yes","no"}      Handrails on the stairs?   {Blank single:19197::"yes","no"}      Poor lighting?   {Blank single:19197::"yes","no"} 4.  Has the patient noticed any hearing difficulties?   {Blank single:19197::"yes","no"}  Diet Recall and Exercise Regimen: Diet: Breakfast- cereal and fruit; Lunch- sandwich; Dinner-cooks at home-vegetables at every dinner; Drinks-water; decaf tea with honey, no caffeine Exercise: weights a few times a week, swimming occasionally; golf and tennis; some exercise daily   Fall Risk: Fall Risk  08/19/2017 10/20/2016 09/24/2015 08/12/2013  Falls in the past year? Yes No No No  Number falls in past yr: 1 - - -  Injury with Fall? Yes - - -  Comment leg cramps/ cut on head - - -    Depression Screen: No concerns for depression Depression screen [facility] 2/9 08/19/2017 10/20/2016 09/24/2015 08/12/2013  Decreased Interest 0 0 0 0  Down, Depressed, Hopeless 0 0 0 0  PHQ - 2 Score 0 0 0 0     Advanced Directives: A voluntary discussion about advance care planning including the explanation and discussion of advance directives was discussed with the patient. Explanation about the health care proxy and living will was reviewed.  During this discussion, the patient was able to identify a health care proxy as his wife and plans/does not plan to fill out the paperwork required and will bring this to our office to keep on file. Does patient have a HCPOA?    yes If yes, name and contact information: wife Does patient have a living will or MOST form?  {Blank single:19197::"yes","no"}  Cancer Screenings: Lung: Low Dose CT Chest recommended if Age 13-80 years, 30 pack-year currently smoking OR have quit w/in  15years. Patient DOES NOT qualify.  Lifestyle risk factor issued reviewed: Diet, exercise, weight management, advised patient smoking is not healthy, nutrition/diet.   Prostate: will order today Colorectal: scheduled in February  Additional Screenings:  Hepatitis B/HIV/Syphillis:declines Hepatitis C Screening: done Intimate Partner Violence: no AAA Screen: Men age 41 to 77 years if ever smoked recommended to get a one time AAA ultrasound screening exam. Patient does not qualify.  Objective:   Vitals: BP 110/68 (BP Location: Left Arm, Patient Position: Sitting, Cuff Size: Normal)   Pulse (!) 57   Temp  97.7 F (36.5 C) (Oral)   Resp 16   Ht 6' (1.829 m)   Wt 171 lb 12.8 oz (77.9 kg)   SpO2 97%   BMI 23.30 kg/m  Body mass index is 23.3 kg/m.  Lab Results  Component Value Date   CHOL 150 10/20/2016   CHOL 141 11/13/2014   CHOL 140 08/12/2013   Lab Results  Component Value Date   HDL 64.10 10/20/2016   HDL 57.50 11/13/2014   HDL 57.10 08/12/2013   Lab Results  Component Value Date   LDLCALC 72 10/20/2016   LDLCALC 72 11/13/2014   LDLCALC 75 08/12/2013   Lab Results  Component Value Date   TRIG 70.0 10/20/2016   TRIG 58.0 11/13/2014   TRIG 38.0 08/12/2013   Lab Results  Component Value Date   CHOLHDL 2 10/20/2016   CHOLHDL 2 11/13/2014   CHOLHDL 2 08/12/2013   No results found for: LDLDIRECT  No exam data present  Cognitive Testing - 6-CIT  Correct? Score   What year is it? {YES NO:22349} {Numbers; 0-4:31231} Yes = 0    No = 4  What month is it? {YES NO:22349} {Numbers; 0-4:31231} Yes = 0    No = 3  Remember:     Pia Mau, [place], [place]     What time is it? {YES NO:22349} {Numbers; 0-4:31231} Yes = 0    No = 3  Count backwards from 20 to 1 {YES NO:22349} {Numbers; 0-4:31231} Correct = 0    1 error = 2   More than 1 error = 4  Say the months of the year in reverse. {YES NO:22349} {Numbers; 0-4:31231} Correct = 0    1 error = 2   More than 1 error  = 4  What address did I ask you to remember? {YES NO:22349} {NUMBERS; 0-10:5044} Correct = 0  1 error = 2    2 error = 4    3 error = 6    4 error = 8    All wrong = 10       TOTAL SCORE  {Numbers; 5-27:78242}/35   Interpretation:  {Desc; normal/abnormal:11317::"Normal"}  Normal (0-7) Abnormal (8-28)   Fall Risk: Fall Risk  08/19/2017 10/20/2016 09/24/2015 08/12/2013  Falls in the past year? Yes No No No  Number falls in past yr: 1 - - -  Injury with Fall? Yes - - -  Comment leg cramps/ cut on head - - -    Depression Screen Depression screen [facility] 2/9 08/19/2017 10/20/2016 09/24/2015 08/12/2013  Decreased Interest 0 0 0 0  Down, Depressed, Hopeless 0 0 0 0  PHQ - 2 Score 0 0 0 0    No results found for this or any previous visit (from the past 2160 hour(s)).   Assessment & Plan:     1. Encounter for general adult medical examination with abnormal findings *** - Lipid panel; Future - PSA, Medicare; Future  2. PAF (paroxysmal atrial fibrillation) (HCC) *** - TSH; Future - CBC with Differential/Platelet; Future - Comprehensive metabolic panel; Future  3. Screening for prostate cancer *** - PSA, Medicare; Future  4. Screening for cholesterol level *** - Lipid panel; Future  *** type dotphrase "dot"diagmed to refresh this list  Exercise Activities and Dietary recommendations Goals    None     Discussed health benefits of physical activity, and encouraged him to engage in regular exercise appropriate for his age and condition.   Immunization History  Administered Date(s) Administered  . Influenza Split 07/10/2011  . Influenza Whole 07/06/2002, 08/06/2012, 06/20/2013  . Influenza-Unspecified 08/05/2015  . Pneumococcal Conjugate-13 10/20/2016  . Td 05/23/2002  . Tdap 08/12/2013, 10/08/2014  . Zoster 07/06/2012    Health Maintenance  Topic Date Due  . COLONOSCOPY  09/20/2015  . PNA vac Low Risk Adult (2 of 2 - PPSV23) 10/20/2017  . TETANUS/TDAP  10/08/2024  .  INFLUENZA VACCINE  Completed  . Hepatitis C Screening  Completed     No orders of the defined types were placed in this encounter.   Current Outpatient Medications:  .  acetaminophen (TYLENOL) 325 MG tablet, Take 650 mg by mouth daily as needed for mild pain, moderate pain or headache (pain). , Disp: , Rfl:  .  Ascorbic Acid (VITAMIN C) 100 MG tablet, Take 100 mg by mouth daily., Disp: , Rfl:  .  aspirin EC 81 MG tablet, Take 2 tablets (162 mg total) by mouth daily., Disp: 180 tablet, Rfl: 3 .  cholecalciferol (VITAMIN D) 1000 UNITS tablet, Take 1,000 Units by mouth daily., Disp: , Rfl:  .  Flaxseed, Linseed, (FLAXSEED OIL) 1000 MG CAPS, Take by mouth as directed. , Disp: , Rfl:  .  metoprolol tartrate (LOPRESSOR) 25 MG tablet, Take 0.5 tablets (12.5 mg total) by mouth 2 (two) times daily., Disp: 90 tablet, Rfl: 3 .  ranitidine (ZANTAC) 150 MG tablet, Take 150 mg by mouth as needed for heartburn (Take as directed). , Disp: , Rfl:  .  thiamine (VITAMIN B-1) 100 MG tablet, Take 100 mg by mouth daily., Disp: , Rfl:  .  vitamin E 100 UNIT capsule, Take 100 Units by mouth daily. , Disp: , Rfl:  There are no discontinued medications.  I have personally reviewed and addressed the Medicare Annual Wellness health risk assessment questionnaire and have noted the following in the patient's chart:  A.         Medical and social history & family history B.         Use of alcohol, tobacco or illicit drugs  C.         Current medications and supplements D.         Functional and Cognitive ability and status E.         Nutritional status F.         Physical activity G.        Advance directives H.         List of other physicians I.          Hospitalizations, surgeries, and ER visits in previous 12 months J.         Simms such as hearing and vision if needed, cognitive and depression L.         Referrals and appointments - ***  In addition, I have reviewed and discussed with  patient certain preventive protocols, quality metrics, and best practice recommendations. A written personalized care plan for preventive services as well as general preventive health recommendations were provided to patient.  See attached scanned questionnaire for additional information.   No Follow-up on file.  *** refresh to show  25+ pneumonia

## 2017-10-28 NOTE — Progress Notes (Deleted)
Name: Michael Lindsey   MRN: [id number]    DOB: 03-Feb-1951   Date:10/28/2017       Progress Note  Subjective  Chief Complaint  Chief Complaint  Patient presents with  . Establish Care    CPE, fasting    HPI  Patient presents for annual CPE ***.  USPSTF grade A and B recommendations:   Your depression screening is negative today-do you feel like you have had any issues?no  Hypertension: BP Readings from Last 3 Encounters:  10/28/17 110/68  10/26/17 124/64  10/12/17 134/62   Your BP is stable/not at goal- can you please return for discussion and management of this in the next 2 weeks so we can focus on that?  Obesity: Wt Readings from Last 3 Encounters:  10/28/17 171 lb 12.8 oz (77.9 kg)  10/26/17 171 lb (77.6 kg)  10/12/17 173 lb (78.5 kg)   BMI Readings from Last 3 Encounters:  10/28/17 23.30 kg/m  10/26/17 23.19 kg/m  10/12/17 23.46 kg/m    Your BMI is X-its not always about that number but we do need to check for diabetes and cholesterol-code A1c and lipids  Lipids:  Lab Results  Component Value Date   CHOL 150 10/20/2016   CHOL 141 11/13/2014   CHOL 140 08/12/2013   Lab Results  Component Value Date   HDL 64.10 10/20/2016   HDL 57.50 11/13/2014   HDL 57.10 08/12/2013   Lab Results  Component Value Date   LDLCALC 72 10/20/2016   LDLCALC 72 11/13/2014   LDLCALC 75 08/12/2013   Lab Results  Component Value Date   TRIG 70.0 10/20/2016   TRIG 58.0 11/13/2014   TRIG 38.0 08/12/2013   Lab Results  Component Value Date   CHOLHDL 2 10/20/2016   CHOLHDL 2 11/13/2014   CHOLHDL 2 08/12/2013   No results found for: LDLDIRECT Glucose:  Glucose, Bld  Date Value Ref Range Status  09/24/2016 90 70 - 99 mg/dL Final  09/24/2015 97 70 - 99 mg/dL Final  11/13/2014 88 70 - 99 mg/dL Final   q year as a screening or routine with HLD but they need f/u visit to discuss results  Alcohol: ***How many drinks a week? One drink a week Tobacco use: ***how many  PPD? Smoking cessation if needed. "Smoking cessation discussed." not a smoker  Married are you monogamous? Yes  STD testing and prevention (chl/gon/syphilis): NO HIV, hep C: ***We will test today/declines-once in lifetime or annually if not monogamous? Once for hep c with baby boomers- it is recommended that we check. NO  Skin cancer: ***Concerning lesions? Dermatology? No, follwos with dermatology Colorectal cancer: ***Personal or family history? Father; Bloody, dark,tarry stools? no Changes in bowel movement? no At age 76 order colonoscopy. If 40 or over, refer to GI. If under 40-hemocult if blood-refer to GI if +; miralax if constipated. Colonoscopy is coming up Prostate cancer: *** Lab Results  Component Value Date   PSA 0.52 10/20/2016   PSA 0.38 09/24/2015   PSA 0.31 06/14/2010   IPSS Questionnaire (AUA-7): Over the past month.   1)  How often have you had a sensation of not emptying your bladder completely after you finish urinating? no {Rating:19227}  2)  How often have you had to urinate again less than two hours after you finished urinating? no {Rating:19227}  3)  How often have you found you stopped and started again several times when you urinated? occasionally {Rating:19227}  4) How difficult have you found  it to postpone urination? no {Rating:19227}  5) How often have you had a weak urinary stream? occsionally {Rating:19227}  6) How often have you had to push or strain to begin urination? no {Rating:19227}  7) How many times did you most typically get up to urinate from the time you went to bed until the time you got up in the morning? Once at night {Rating:19228}  Total score:  0-7 mildly symptomatic-can offer PSA if they want or monitor-always if family hx   8-19 moderately symptomatic-always PSA   20-35 severely symptomatic-always PSA  Associate PSA with screening for prostate cancer  Lung cancer:  *** Low Dose CT Chest (ORDER CT chest lung ca screen low dose w/o  CM-associate with lung cancer screening) recommended if Age 24-80 years, 27 pack-year  currently smoking OR have quit w/in 15years. Patient {DOES NOT does:27190::"does not"} qualify.   AAA (ORDER [place] abd aorta screening AAA-associate with screening for AAA): *** The USPSTF recommends one-time screening with ultrasonography in men ages 84 to 42 years who have ever smoked-to check for weakening in your main artery so that we know and can monitor it- refer to specialist if abnormal (Have patient call insurance to check coverage while awaiting referral)  Aspirin: ***taking? If HLD/CAD and no CI- recommend 81 daily-document "aortic atherosclerosis and recommended 81mg  asa today" 2 baby asa I nam ECG:  ***if indicated-if they report CP or SOB in ROS folows with fcard  {VACCINES HISTORY:21023000} will complete pna today  {hx healthmaintenance rooming patient:311061}  Advanced Care Planning: A voluntary discussion about advance care planning including the explanation and discussion of advance directives.  Discussed health care proxy and Living will, and the patient was able to identify a health care proxy as ***.  Patient {DOES_DOES WPY:09983} have a living will at present time. If patient does have living will, I have requested they bring this to the clinic to be scanned in to their chart. WIFE  Patient Active Problem List   Diagnosis Date Noted  . PAF (paroxysmal atrial fibrillation) ([place]) 11/17/2016  . Medicare annual wellness visit, subsequent 10/20/2016  . Palpitations 09/24/2016  . Dyspnea 09/24/2016  . Fatigue 09/24/2016  . Eczema 11/03/2013  . Allergic rhinitis 06/01/2013  . Skin cancer 08/11/2012  . Dysphagia 12/08/2007  . Osteopenia 09/14/2007  . Esophageal reflux 04/13/2007    Past Surgical History:  Procedure Laterality Date  . cataract surgery  10/2014  . COLONOSCOPY  2011   Dr Sharlett Iles  . HERNIA REPAIR    . SKIN CANCER EXCISION  2011   R calf, Dr Jarome Matin  . UPPER  GASTROINTESTINAL ENDOSCOPY  2003 & 2008    H pylori 2008    Family History  Problem Relation Age of Onset  . Depression Mother   . Tuberculosis Mother   . Coronary artery disease Father   . Emphysema Father   . Colon cancer Father 69  . Depression Brother   . Alcohol abuse Paternal Uncle   . Diabetes Neg Hx   . Stroke Neg Hx     Social History   Socioeconomic History  . Marital status: Married    Spouse name: Not on file  . Number of children: 2  . Years of education: 3  . Highest education level: Not on file  Social Needs  . Financial resource strain: Not on file  . Food insecurity - worry: Not on file  . Food insecurity - inability: Not on file  . Transportation needs -  medical: Not on file  . Transportation needs - non-medical: Not on file  Occupational History  . Occupation: IMMIGRATION ATTORNEY  Tobacco Use  . Smoking status: Former Smoker    Last attempt to quit: 10/06/1980    Years since quitting: 37.0  . Smokeless tobacco: Never Used  . Tobacco comment: intermittent smoker over 4 years in college & again in 1983 before quitting, never > 2 mos @ a time,never > 1/2 ppd  Substance and Sexual Activity  . Alcohol use: No    Alcohol/week: 0.0 oz    Comment: SOCIALLY,  . Drug use: No  . Sexual activity: Not on file  Other Topics Concern  . Not on file  Social History Narrative   GETS REG EXERCISE      Lives in [place]   Works as an Engineer, petroleum        Current Outpatient Medications:  .  acetaminophen (TYLENOL) 325 MG tablet, Take 650 mg by mouth daily as needed for mild pain, moderate pain or headache (pain). , Disp: , Rfl:  .  Ascorbic Acid (VITAMIN C) 100 MG tablet, Take 100 mg by mouth daily., Disp: , Rfl:  .  aspirin EC 81 MG tablet, Take 2 tablets (162 mg total) by mouth daily., Disp: 180 tablet, Rfl: 3 .  cholecalciferol (VITAMIN D) 1000 UNITS tablet, Take 1,000 Units by mouth daily., Disp: , Rfl:  .  Flaxseed, Linseed, (FLAXSEED OIL) 1000  MG CAPS, Take by mouth as directed. , Disp: , Rfl:  .  metoprolol tartrate (LOPRESSOR) 25 MG tablet, Take 0.5 tablets (12.5 mg total) by mouth 2 (two) times daily., Disp: 90 tablet, Rfl: 3 .  ranitidine (ZANTAC) 150 MG tablet, Take 150 mg by mouth as needed for heartburn (Take as directed). , Disp: , Rfl:  .  thiamine (VITAMIN B-1) 100 MG tablet, Take 100 mg by mouth daily., Disp: , Rfl:  .  vitamin E 100 UNIT capsule, Take 100 Units by mouth daily. , Disp: , Rfl:   Allergies  Allergen Reactions  . Chlorine     EYES WATER/NASAL CONGESTION     ROS  *** Constitutional: Negative for fever or weight change.  Respiratory: Negative for cough and shortness of breath.   Cardiovascular: Negative for chest pain or palpitations.  Gastrointestinal: Negative for abdominal pain, no bowel changes.  Musculoskeletal: Negative for gait problem or joint swelling. knee pain Skin: Negative for rash.  Neurological: Negative for dizziness or headache.  No other specific complaints in a complete review of systems (except as listed in HPI above).   Objective  Vitals:   10/28/17 0803  BP: 110/68  Pulse: (!) 57  Resp: 16  Temp: 97.7 F (36.5 C)  TempSrc: Oral  SpO2: 97%  Weight: 171 lb 12.8 oz (77.9 kg)  Height: 6' (1.829 m)    Body mass index is 23.3 kg/m.  Physical Exam *** Constitutional: Patient appears well-developed and well-nourished. No distress.  HENT: Head: Normocephalic and atraumatic. Ears: B TMs ok, no erythema or effusion; Nose: Nose normal. Mouth/Throat: Oropharynx is clear and moist. No oropharyngeal exudate.  Eyes: Conjunctivae and EOM are normal. Pupils are equal, round, and reactive to light. No scleral icterus.  Neck: Normal range of motion. Neck supple. No JVD present. No thyromegaly present.  Cardiovascular: Normal rate, regular rhythm and normal heart sounds.  No murmur heard. No BLE edema. Pulmonary/Chest: Effort normal and breath sounds normal. No respiratory  distress. Abdominal: Soft. Bowel sounds are normal, no distension.  There is no tenderness. no masses MALE GENITALIA: Normal descended testes bilaterally, no masses palpated, no hernias, no lesions, no discharge RECTAL: Prostate normal size and consistency, no rectal masses or hemorrhoids Musculoskeletal: Normal range of motion, no joint effusions. No gross deformities Neurological: he is alert and oriented to person, place, and time. No cranial nerve deficit. Coordination, balance, strength, speech and gait are normal.  Skin: Skin is warm and dry. No rash noted. No erythema.  Psychiatric: Patient has a normal mood and affect. behavior is normal. Judgment and thought content normal.  No results found for this or any previous visit (from the past 2160 hour(s)).  Diabetic Foot Exam: Diabetic Foot Exam - Simple   No data filed     ***  PHQ2/9: Depression screen [facility] 2/9 08/19/2017 10/20/2016 09/24/2015 08/12/2013  Decreased Interest 0 0 0 0  Down, Depressed, Hopeless 0 0 0 0  PHQ - 2 Score 0 0 0 0   ***  ***   Assessment & Plan  There are no diagnoses linked to this encounter.   -Prostate cancer screening and PSA options (with potential risks and benefits of testing vs not testing) were discussed along with recent recs/guidelines. -USPSTF grade A and B recommendations reviewed with patient; age-appropriate recommendations, preventive care, screening tests, etc discussed and encouraged; healthy living encouraged; see AVS for patient education given to patient -Discussed importance of 150 minutes of physical activity weekly, eat two servings of fish weekly, eat one serving of tree nuts ( cashews, pistachios, pecans, almonds.Marland Kitchen) every other day, eat 6 servings of fruit/vegetables daily and drink plenty of water and avoid sweet beverages.  -Red flags and when to present for emergency care or RTC including fever >101.34F, chest pain, shortness of breath, new/worsening/un-resolving symptoms,  *** reviewed with patient at time of visit. Follow up and care instructions discussed and provided in AVS. DELETE IF NOT NEEDED -Reviewed Health Maintenance: ***what did we do in health maintenance  ***Return in 1-2 weeks for chronic problems to discuss labs and management- cholesterol, diabetes, thyroid, vitamin d , HTN  Weights every other day, calcium and vitamind

## 2017-11-01 ENCOUNTER — Encounter: Payer: Self-pay | Admitting: Nurse Practitioner

## 2017-11-01 NOTE — Progress Notes (Signed)
Name: Michael Lindsey   MRN: [id number]    DOB: 15-Jun-1951   Date:11/01/2017       Progress Note  Subjective  Chief Complaint  Chief Complaint  Patient presents with  . Establish Care    establish care    HPI  Michael Lindsey is a 67 y.o. male who presents today to establish care and follow up on routine health maintenance. He is followed by cardiology for paroxsymal atrial fibrillation- he last saw cardiology 2 days ago for recent increase in palpitations. He was originally diagnosed with atrial fibrillation in 09/2016 and has had only short lasting intermittent episodes since- with a noted increase this month. He is maintained on metoprolol 12.5 daily for his atrial fibrillation with instructions to take additional 12.5 mg dose for episodes of palpitations. He is also maintained on daily aspirin 162mg  and declined initiating eliquis per cardiology visit notes 2 days ago. He did have an EKG completed on cardiology visit on 1/21 which read normal sinus rhythm.  He is wanting to follow up on routine health maintenance today as well. He is currently working full time as a Chief Executive Officer. He is quite active and tries to follow a healthy diet. He has never been a smoker or tobacco user and he reports drinking about one alcoholic drink per week. He is married and in a monogamous sexual relationship with his wife. He does not have any concerns for annual STD testing today. He has already completed his hepatitis C screening in 2014. His recommended vaccinations will be up to date after we administer his Pneumococcal polysaccharide vaccine 23 today. He is scheduled for his 5 year colonoscopy this upcoming February- he denies any personal history of colon cancer but his father did have colon cancer. He also sees dermatology once a year for an annual skin check and reports routine use of sunscreen. He denies any concerns for anxiety or depression today. He does have an Advanced Directive with his wife as healthcare  power of attorney-he will bring an updated copy to the office at a future visit.  Diet Recall and Exercise Regimen: Diet: Breakfast- cereal and fruit; Lunch- sandwich; Dinner-cooks at home-vegetables at every dinner; Drinks-water; decaf tea with honey, no caffeine  Exercise: weights a few times a week, swimming occasionally; regular golf and tennis; some form of exercise daily 7 days a week  Patient Active Problem List   Diagnosis Date Noted  . PAF (paroxysmal atrial fibrillation) ([place]) 11/17/2016  . Medicare annual wellness visit, subsequent 10/20/2016  . Palpitations 09/24/2016  . Dyspnea 09/24/2016  . Fatigue 09/24/2016  . Eczema 11/03/2013  . Allergic rhinitis 06/01/2013  . Skin cancer 08/11/2012  . Dysphagia 12/08/2007  . Osteopenia 09/14/2007  . Esophageal reflux 04/13/2007    Past Surgical History:  Procedure Laterality Date  . cataract surgery  10/2014  . COLONOSCOPY  2011   Dr Sharlett Iles  . HERNIA REPAIR    . SKIN CANCER EXCISION  2011   R calf, Dr Jarome Matin  . UPPER GASTROINTESTINAL ENDOSCOPY  2003 & 2008    H pylori 2008    Family History  Problem Relation Age of Onset  . Depression Mother   . Tuberculosis Mother   . Coronary artery disease Father   . Emphysema Father   . Colon cancer Father 103  . Depression Brother   . Alcohol abuse Paternal Uncle   . Diabetes Neg Hx   . Stroke Neg Hx     Social History  Socioeconomic History  . Marital status: Married    Spouse name: Not on file  . Number of children: 2  . Years of education: 79  . Highest education level: Not on file  Social Needs  . Financial resource strain: Not on file  . Food insecurity - worry: Not on file  . Food insecurity - inability: Not on file  . Transportation needs - medical: Not on file  . Transportation needs - non-medical: Not on file  Occupational History  . Occupation: IMMIGRATION ATTORNEY  Tobacco Use  . Smoking status: Former Smoker    Last attempt to quit: 10/06/1980     Years since quitting: 37.0  . Smokeless tobacco: Never Used  . Tobacco comment: intermittent smoker over 4 years in college & again in 1983 before quitting, never > 2 mos @ a time,never > 1/2 ppd  Substance and Sexual Activity  . Alcohol use: No    Alcohol/week: 0.0 oz    Comment: SOCIALLY,  . Drug use: No  . Sexual activity: Not on file  Other Topics Concern  . Not on file  Social History Narrative   GETS REG EXERCISE      Lives in [place]   Works as an Engineer, petroleum        Current Outpatient Medications:  .  acetaminophen (TYLENOL) 325 MG tablet, Take 650 mg by mouth daily as needed for mild pain, moderate pain or headache (pain). , Disp: , Rfl:  .  Ascorbic Acid (VITAMIN C) 100 MG tablet, Take 100 mg by mouth daily., Disp: , Rfl:  .  aspirin EC 81 MG tablet, Take 2 tablets (162 mg total) by mouth daily., Disp: 180 tablet, Rfl: 3 .  cholecalciferol (VITAMIN D) 1000 UNITS tablet, Take 1,000 Units by mouth daily., Disp: , Rfl:  .  Flaxseed, Linseed, (FLAXSEED OIL) 1000 MG CAPS, Take by mouth as directed. , Disp: , Rfl:  .  metoprolol tartrate (LOPRESSOR) 25 MG tablet, Take 0.5 tablets (12.5 mg total) by mouth 2 (two) times daily., Disp: 90 tablet, Rfl: 3 .  ranitidine (ZANTAC) 150 MG tablet, Take 150 mg by mouth as needed for heartburn (Take as directed). , Disp: , Rfl:  .  thiamine (VITAMIN B-1) 100 MG tablet, Take 100 mg by mouth daily., Disp: , Rfl:  .  vitamin E 100 UNIT capsule, Take 100 Units by mouth daily. , Disp: , Rfl:   Allergies  Allergen Reactions  . Chlorine     EYES WATER/NASAL CONGESTION     Review of Systems  Constitutional: Negative for chills, fever and weight loss.  HENT: Negative for hearing loss and sore throat.   Eyes: Negative for blurred vision and double vision.  Respiratory: Negative for cough and shortness of breath.   Cardiovascular: Negative for chest pain and leg swelling.  Gastrointestinal: Negative for abdominal pain, blood in  stool, constipation, diarrhea and heartburn.  Genitourinary: Negative for dysuria and hematuria.       Positive for occasional weak stream and occasional urinary hesitancy.  Musculoskeletal: Negative for falls.       Negative for joint swelling.  Skin: Negative for rash.  Neurological: Negative for dizziness, speech change and headaches.  Endo/Heme/Allergies: Negative for environmental allergies. Does not bruise/bleed easily.  Psychiatric/Behavioral: Negative for depression. The patient is not nervous/anxious.    Objective  Vitals:   10/28/17 0803  BP: 110/68  Pulse: (!) 57  Resp: 16  Temp: 97.7 F (36.5 C)  TempSrc: Oral  SpO2:  97%  Weight: 171 lb 12.8 oz (77.9 kg)  Height: 6' (1.829 m)  Maintained on metoprolol  Body mass index is 23.3 kg/m.  Physical Exam Constitutional: Patient appears well-developed and well-nourished. No distress.  HENT: Head: Normocephalic and atraumatic. Ears: Bilateral TMs without erythema or effusion; Nose: Nose normal. Mouth/Throat: Oropharynx is clear and moist. No oropharyngeal exudate.  Eyes: Conjunctivae and EOM are normal. Pupils are equal, round, and reactive to light. No scleral icterus.  Neck: Normal range of motion. Neck supple. No JVD present. No thyromegaly present. No cervical adenopathy. Cardiovascular: Normal rate, regular rhythm and normal heart sounds.  No murmur heard. No BLE edema. Pulmonary/Chest: Effort normal and breath sounds normal. No respiratory distress. Abdominal: Soft. Bowel sounds are normal, no distension. There is no tenderness. no masses Musculoskeletal: Normal range of motion, no joint effusions. No gross deformities Neurological: he is alert and oriented to person, place, and time. No cranial nerve deficit. Coordination, balance, strength, speech and gait are normal.  Skin: Skin is warm and dry. No rash noted. No erythema.  Psychiatric: Patient has a normal mood and affect. behavior is normal. Judgment and thought  content normal.   Recent Results (from the past 2160 hour(s))  PSA, Medicare     Status: None   Collection Time: 10/28/17  8:54 AM  Result Value Ref Range   PSA 0.36 0.10 - 4.00 ng/ml    Comment: Test performed using Access Hybritech PSA Assay, a parmagnetic partical, chemiluminecent immunoassay.  Comprehensive metabolic panel     Status: None   Collection Time: 10/28/17  8:54 AM  Result Value Ref Range   Sodium 141 135 - 145 mEq/L   Potassium 4.2 3.5 - 5.1 mEq/L   Chloride 104 96 - 112 mEq/L   CO2 25 19 - 32 mEq/L   Glucose, Bld 89 70 - 99 mg/dL   BUN 11 6 - 23 mg/dL   Creatinine, Ser 0.88 0.40 - 1.50 mg/dL   Total Bilirubin 0.8 0.2 - 1.2 mg/dL   Alkaline Phosphatase 59 39 - 117 U/L   AST 19 0 - 37 U/L   ALT 16 0 - 53 U/L   Total Protein 6.4 6.0 - 8.3 g/dL   Albumin 3.9 3.5 - 5.2 g/dL   Calcium 8.8 8.4 - 10.5 mg/dL   GFR 91.61 >60.00 mL/min  CBC with Differential/Platelet     Status: None   Collection Time: 10/28/17  8:54 AM  Result Value Ref Range   WBC 6.9 4.0 - 10.5 K/uL   RBC 4.60 4.22 - 5.81 Mil/uL   Hemoglobin 15.3 13.0 - 17.0 g/dL   HCT 44.8 39.0 - 52.0 %   MCV 97.5 78.0 - 100.0 fl   MCHC 34.2 30.0 - 36.0 g/dL   RDW 13.5 11.5 - 15.5 %   Platelets 211.0 150.0 - 400.0 K/uL   Neutrophils Relative % 69.0 43.0 - 77.0 %   Lymphocytes Relative 15.2 12.0 - 46.0 %   Monocytes Relative 10.3 3.0 - 12.0 %   Eosinophils Relative 5.0 0.0 - 5.0 %   Basophils Relative 0.5 0.0 - 3.0 %   Neutro Abs 4.8 1.4 - 7.7 K/uL   Lymphs Abs 1.1 0.7 - 4.0 K/uL   Monocytes Absolute 0.7 0.1 - 1.0 K/uL   Eosinophils Absolute 0.3 0.0 - 0.7 K/uL   Basophils Absolute 0.0 0.0 - 0.1 K/uL  Lipid panel     Status: None   Collection Time: 10/28/17  8:54 AM  Result Value Ref  Range   Cholesterol 165 0 - 200 mg/dL    Comment: ATP III Classification       Desirable:  < 200 mg/dL               Borderline High:  200 - 239 mg/dL          High:  > = 240 mg/dL   Triglycerides 70.0 0.0 - 149.0 mg/dL     Comment: Normal:  <150 mg/dLBorderline High:  150 - 199 mg/dL   HDL 66.20 >39.00 mg/dL   VLDL 14.0 0.0 - 40.0 mg/dL   LDL Cholesterol 85 0 - 99 mg/dL   Total CHOL/HDL Ratio 2     Comment:                Men          Women1/2 Average Risk     3.4          3.3Average Risk          5.0          4.42X Average Risk          9.6          7.13X Average Risk          15.0          11.0                       NonHDL 98.78     Comment: NOTE:  Non-HDL goal should be 30 mg/dL higher than patient's LDL goal (i.e. LDL goal of < 70 mg/dL, would have non-HDL goal of < 100 mg/dL)  TSH     Status: None   Collection Time: 10/28/17  8:54 AM  Result Value Ref Range   TSH 1.91 0.35 - 4.50 uIU/mL    PHQ2/9: Depression screen [facility] 2/9 08/19/2017 10/20/2016 09/24/2015 08/12/2013  Decreased Interest 0 0 0 0  Down, Depressed, Hopeless 0 0 0 0  PHQ - 2 Score 0 0 0 0   Fall Risk: Fall Risk  08/19/2017 10/20/2016 09/24/2015 08/12/2013  Falls in the past year? Yes No No No  Number falls in past yr: 1 - - -  Injury with Fall? Yes - - -  Comment leg cramps/ cut on head - - -   Immunization History  Administered Date(s) Administered  . Influenza Split 07/10/2011  . Influenza Whole 07/06/2002, 08/06/2012, 06/20/2013  . Influenza-Unspecified 08/05/2015  . Pneumococcal Conjugate-13 10/20/2016  . Td 05/23/2002  . Tdap 08/12/2013, 10/08/2014  . Zoster 07/06/2012    Health Maintenance  Topic Date Due  . COLONOSCOPY  09/20/2015  . PNA vac Low Risk Adult (2 of 2 - PPSV23) 10/20/2017  . TETANUS/TDAP  10/08/2024  . INFLUENZA VACCINE  Completed  . Hepatitis C Screening  Completed    Assessment & Plan RTC in 1 year, or sooner if needed

## 2017-11-01 NOTE — Assessment & Plan Note (Signed)
He will continue metoprolol, ASA, follow up with cardiology as instructed. He did request TSH today for atrial fib, we will check CBC and CMET as well - TSH; Future - CBC with Differential/Platelet; Future - Comprehensive metabolic panel; Future

## 2017-11-01 NOTE — Assessment & Plan Note (Signed)
Healthcare maintenance -Prostate cancer screening and PSA options (with potential risks and benefits of testing vs not testing) were discussed along with recent recs/guidelines. -USPSTF grade A and B recommendations reviewed with patient; age-appropriate recommendations, preventive care, screening tests, etc discussed and encouraged; healthy living encouraged; see AVS for patient education given to patient -Discussed importance of 150 minutes of physical activity weekly, eat two servings of fish weekly, eat one serving of tree nuts ( cashews, pistachios, pecans, almonds.Marland Kitchen) every other day, eat 6 servings of fruit/vegetables daily and drink plenty of water and avoid sweet beverages.  -Red flags and when to present for emergency care or RTC including chest pain, shortness of breath, new/worsening/un-resolving symptoms, reviewed with patient at time of visit.  -Reviewed Health Maintenance: - Pneumococcal polysaccharide vaccine 23-valent greater than or equal to 2yo subcutaneous/IM-Need for pneumococcal vaccination He plans to have colonoscopy with GI next month- will let me know if any trouble scheduling - TSH; Future- Worries - Lipid panel; Future-Screening for cholesterol level - PSA, Medicare-Screening for prostate cancer

## 2017-11-17 ENCOUNTER — Ambulatory Visit: Payer: Medicare Other | Admitting: Interventional Cardiology

## 2018-02-04 ENCOUNTER — Telehealth: Payer: Self-pay | Admitting: Nurse Practitioner

## 2018-02-04 NOTE — Telephone Encounter (Signed)
Spoke with Michael Lindsey regarding AWV. Patient stated that he would like for the office to give him a call back on Tuesday, Feb 09, 2018 to schedule his wellness appointment. SF

## 2018-02-08 DIAGNOSIS — M7711 Lateral epicondylitis, right elbow: Secondary | ICD-10-CM | POA: Diagnosis not present

## 2018-02-08 DIAGNOSIS — M9907 Segmental and somatic dysfunction of upper extremity: Secondary | ICD-10-CM | POA: Diagnosis not present

## 2018-02-08 DIAGNOSIS — M9902 Segmental and somatic dysfunction of thoracic region: Secondary | ICD-10-CM | POA: Diagnosis not present

## 2018-02-08 IMAGING — DX DG CHEST 2V
2 series · 2 of 2 positions shown · non-contrast
Comparison: None.

CLINICAL DATA: Dyspnea and palpitations since yesterday.

EXAM:
CHEST  2 VIEW

[chest pa]
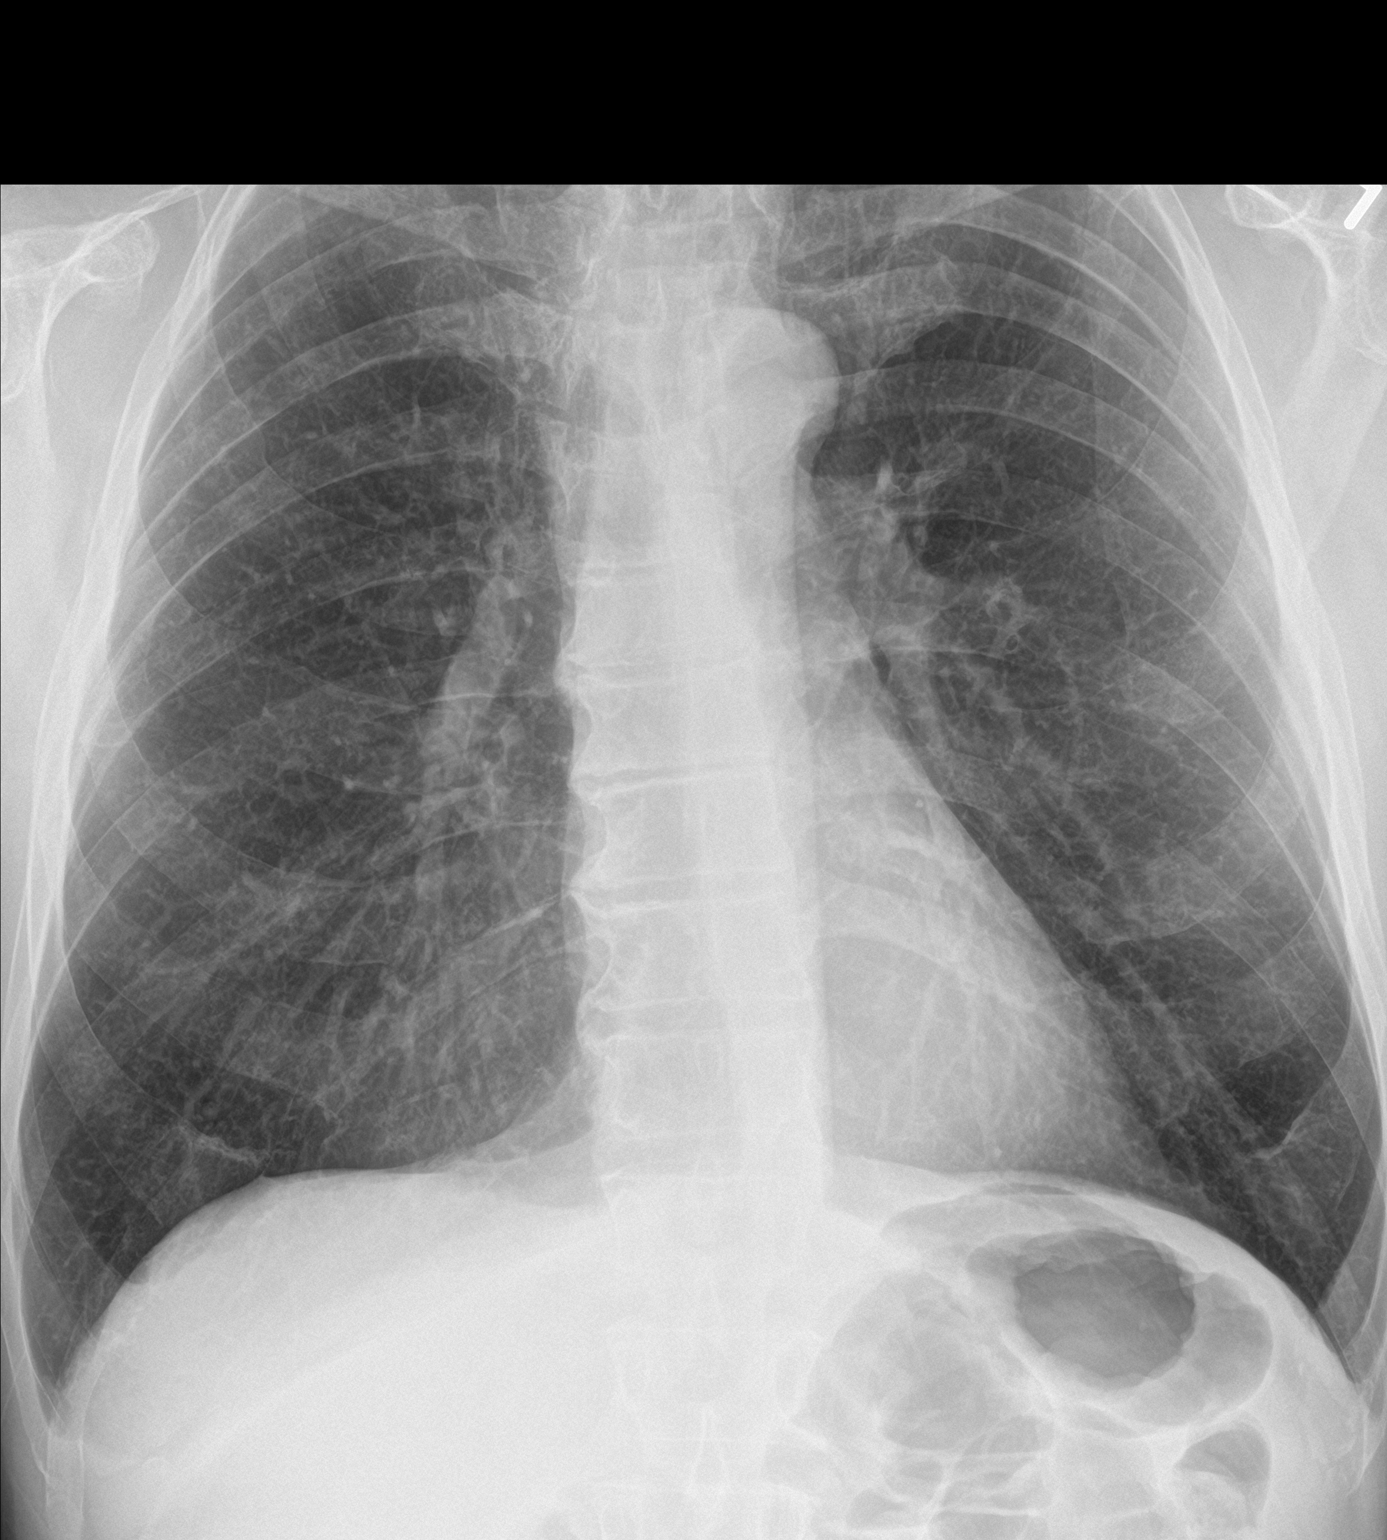

[chest lat]
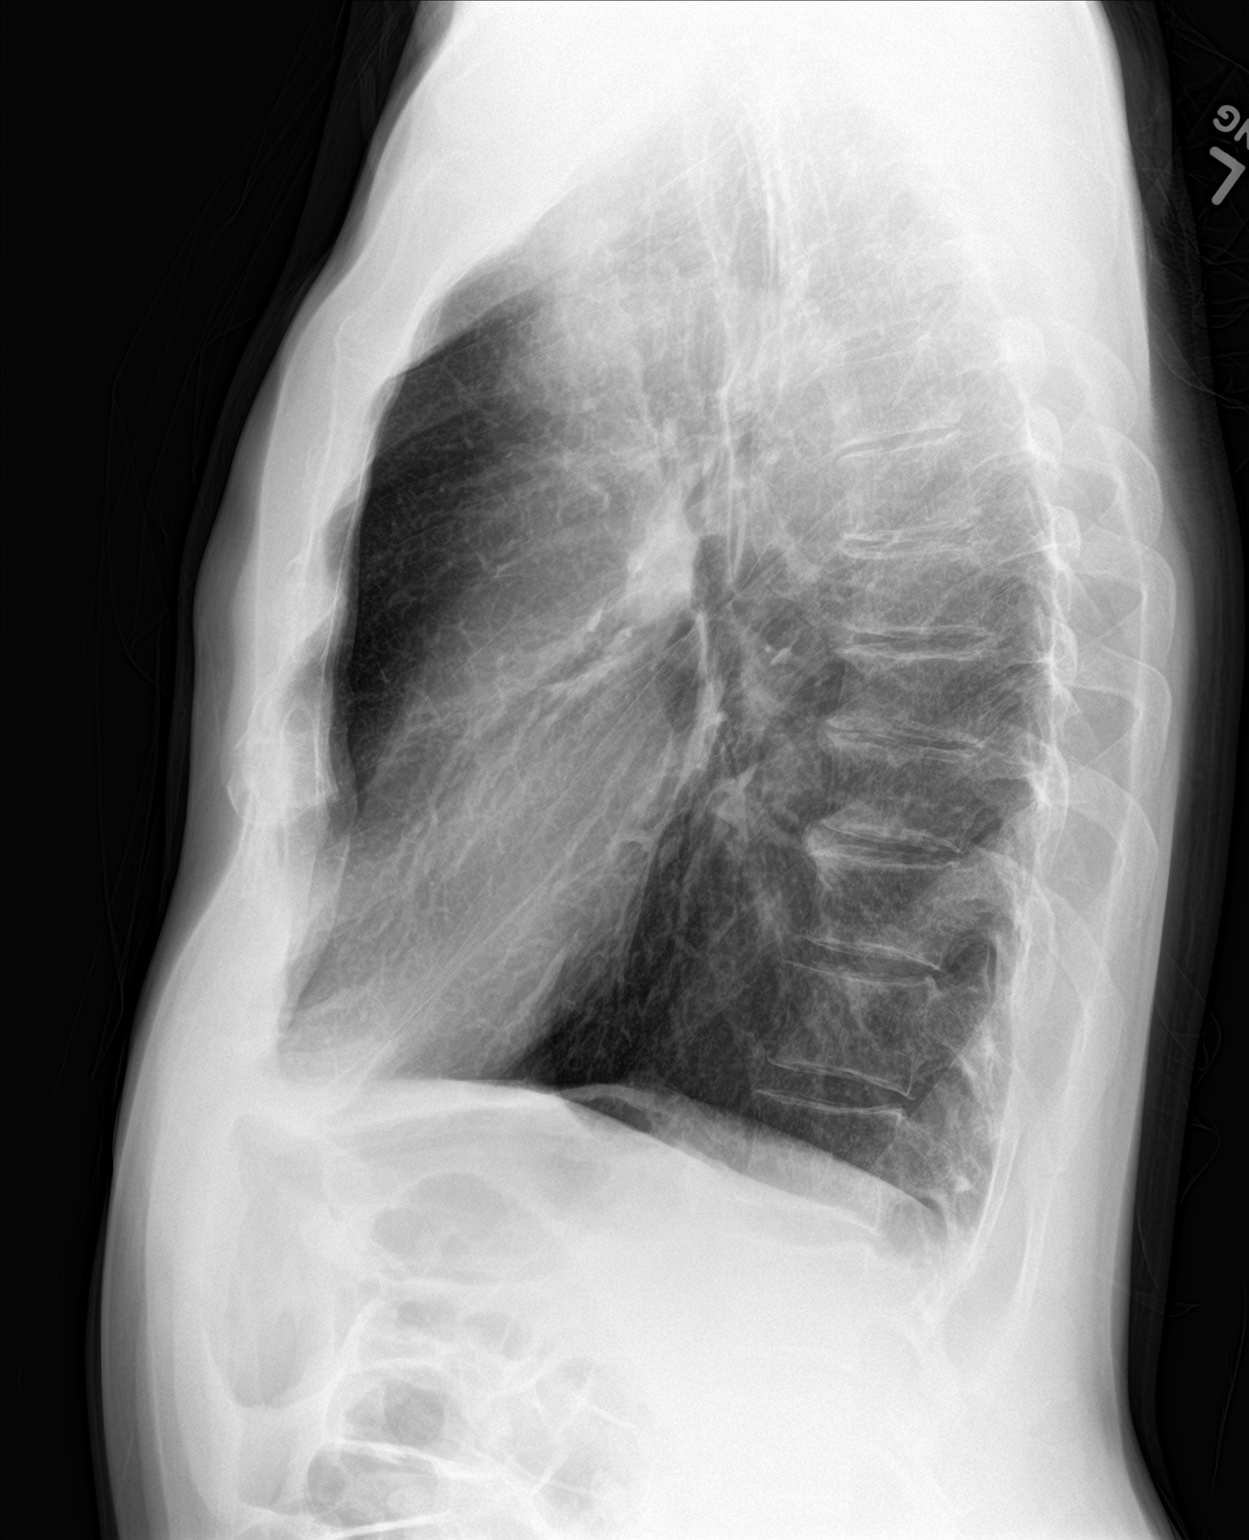

[2 of 2 positions shown; findings below may reference images not displayed]

FINDINGS: The heart size is normal. There is no edema or effusion.
Emphysematous changes are noted. The visualized soft tissues and
bony thorax are otherwise unremarkable.
IMPRESSION: 1. Emphysema.
2. No acute cardiopulmonary disease.

## 2018-03-19 ENCOUNTER — Ambulatory Visit (HOSPITAL_COMMUNITY)
Admission: EM | Admit: 2018-03-19 | Discharge: 2018-03-19 | Disposition: A | Discharge status: Home / Self Care | Payer: Medicare Other | Attending: Family Medicine | Admitting: Family Medicine

## 2018-03-19 ENCOUNTER — Encounter (HOSPITAL_COMMUNITY): Payer: Self-pay | Admitting: Emergency Medicine

## 2018-03-19 DIAGNOSIS — S70361A Insect bite (nonvenomous), right thigh, initial encounter: Secondary | ICD-10-CM

## 2018-03-19 DIAGNOSIS — W57XXXA Bitten or stung by nonvenomous insect and other nonvenomous arthropods, initial encounter: Secondary | ICD-10-CM

## 2018-03-19 MED ORDER — DOXYCYCLINE HYCLATE 100 MG PO CAPS: 100.0000 mg | ORAL_CAPSULE | Freq: Two times a day (BID) | ORAL | 0 refills | Status: DC

## 2018-03-19 NOTE — Discharge Instructions (Addendum)
Take the antibiotic as instructed Return for problems

## 2018-03-19 NOTE — ED Provider Notes (Signed)
[place]    CSN: [id number] Arrival date & time: 03/19/18  2001     History   Chief Complaint Chief Complaint  Patient presents with  . Tick Removal    HPI Michael Lindsey is a 67 y.o. male.   HPI  This evening he found a tick on his thigh.  This just prior to arrival.  He and wife are concerned because he has had [facility] spotted fever before and he was very sick.  His brother has had this as well.  The tick is engorged and the area is quite red.  He is worried to remove it on his own.  He also wishes antibiotics to prevent infection. He has blood pressure well controlled on Lopressor.  He takes some supplements.  Zantac for GERD.  He is in good health, is physically active, compliant with medications and medical care.  Past Medical History:  Diagnosis Date  . Fracture    L arm @ 10; 3 ribs with shoulder separation & PTX 2003  . Gastritis 09/13/2007   EGD  . GERD (gastroesophageal reflux disease)   . Hepatitis A 1972   from exposure to septic tank which malfunctioned  . Internal hemorrhoids 09/13/2007   COLON  . Osteoporosis   . Paroxysmal atrial fibrillation (HCC)    chads2vasc score is 1    Patient Active Problem List   Diagnosis Date Noted  . PAF (paroxysmal atrial fibrillation) ([place]) 11/17/2016  . Healthcare maintenance 10/20/2016  . Palpitations 09/24/2016  . Dyspnea 09/24/2016  . Fatigue 09/24/2016  . Eczema 11/03/2013  . Allergic rhinitis 06/01/2013  . Skin cancer 08/11/2012  . Dysphagia 12/08/2007  . Osteopenia 09/14/2007  . Esophageal reflux 04/13/2007    Past Surgical History:  Procedure Laterality Date  . cataract surgery  10/2014  . COLONOSCOPY  2011   Dr Sharlett Iles  . HERNIA REPAIR    . SKIN CANCER EXCISION  2011   R calf, Dr Jarome Matin  . UPPER GASTROINTESTINAL ENDOSCOPY  2003 & 2008    H pylori 2008       Home Medications    Prior to Admission medications   Medication Sig Start Date End Date Taking? Authorizing  Provider  acetaminophen (TYLENOL) 325 MG tablet Take 650 mg by mouth daily as needed for mild pain, moderate pain or headache (pain).     [provider]  Ascorbic Acid (VITAMIN C) 100 MG tablet Take 100 mg by mouth daily.    [provider]  aspirin EC 81 MG tablet Take 2 tablets (162 mg total) by mouth daily. 01/20/17   Jettie Booze, MD  cholecalciferol (VITAMIN D) 1000 UNITS tablet Take 1,000 Units by mouth daily.    [provider]  doxycycline (VIBRAMYCIN) 100 MG capsule Take 1 capsule (100 mg total) by mouth 2 (two) times daily. 03/19/18   Raylene Everts, MD  Flaxseed, Linseed, (FLAXSEED OIL) 1000 MG CAPS Take by mouth as directed.     [provider]  metoprolol tartrate (LOPRESSOR) 25 MG tablet Take 0.5 tablets (12.5 mg total) by mouth 2 (two) times daily. 10/12/17 01/10/18  Jettie Booze, MD  ranitidine (ZANTAC) 150 MG tablet Take 150 mg by mouth as needed for heartburn (Take as directed).     [provider]  thiamine (VITAMIN B-1) 100 MG tablet Take 100 mg by mouth daily.    [provider]  vitamin E 100 UNIT capsule Take 100 Units by mouth daily.  [provider]    Family History Family History  Problem Relation Age of Onset  . Depression Mother   . Tuberculosis Mother   . Coronary artery disease Father   . Emphysema Father   . Colon cancer Father 2  . Depression Brother   . Alcohol abuse Paternal Uncle   . Diabetes Neg Hx   . Stroke Neg Hx     Social History Social History   Tobacco Use  . Smoking status: Former Smoker    Last attempt to quit: 10/06/1980    Years since quitting: 37.4  . Smokeless tobacco: Never Used  . Tobacco comment: intermittent smoker over 4 years in college & again in 1983 before quitting, never > 2 mos @ a time,never > 1/2 ppd  Substance Use Topics  . Alcohol use: No    Comment: SOCIALLY,  . Drug use: No     Allergies   Chlorine   Review of Systems Review  of Systems  Constitutional: Negative for chills and fever.  HENT: Negative for ear pain and sore throat.   Eyes: Negative for pain and visual disturbance.  Respiratory: Negative for cough and shortness of breath.   Cardiovascular: Negative for chest pain and palpitations.  Gastrointestinal: Negative for abdominal pain and vomiting.  Genitourinary: Negative for dysuria and hematuria.  Musculoskeletal: Negative for arthralgias and back pain.  Skin: Positive for wound. Negative for color change and rash.  Neurological: Negative for seizures and syncope.  All other systems reviewed and are negative.    Physical Exam Triage Vital Signs ED Triage Vitals  Enc Vitals Group     BP 03/19/18 2003 113/73     Pulse Rate 03/19/18 2003 (!) 59     Resp 03/19/18 2003 16     Temp 03/19/18 2003 97.6 F (36.4 C)     Temp src --      SpO2 03/19/18 2003 100 %     Weight --      Height --      Head Circumference --      Peak Flow --      Pain Score 03/19/18 2022 0     Pain Loc --      Pain Edu? --      Excl. in [place]? --    No data found.  Updated Vital Signs BP 113/73   Pulse (!) 59   Temp 97.6 F (36.4 C)   Resp 16   SpO2 100%   Visual Acuity Right Eye Distance:   Left Eye Distance:   Bilateral Distance:    Right Eye Near:   Left Eye Near:    Bilateral Near:     Physical Exam  Constitutional: He appears well-developed and well-nourished. No distress.  HENT:  Head: Normocephalic and atraumatic.  Mouth/Throat: Oropharynx is clear and moist.  Eyes: Pupils are equal, round, and reactive to light. Conjunctivae are normal.  Neck: Normal range of motion.  Cardiovascular: Normal rate.  Pulmonary/Chest: Effort normal. No respiratory distress.  Abdominal: Soft. He exhibits no distension.  Musculoskeletal: Normal range of motion. He exhibits no edema.  Neurological: He is alert.  Skin: Skin is warm and dry.  Right thigh, upper medial, there is a red indurated macule about 15 mm across  with partially engorged small tick centrally.  Tick is removed.  Wound care discussed.     UC Treatments / Results  Labs (all labs ordered are listed, but only abnormal results are displayed) Labs Reviewed - No  data to display  EKG None  Radiology No results found.  Procedures Procedures (including critical care time)  Medications Ordered in UC Medications - No data to display  Initial Impression / Assessment and Plan / UC Course  I have reviewed the triage vital signs and the nursing notes.  Pertinent labs & imaging results that were available during my care of the patient were reviewed by me and considered in my medical decision making (see chart for details).     Discussed tick bite, Lyme disease, other tickborne illness.  Patient desires treatment.  He has taken tetracycline before without difficulty.  He is provided with tetracycline and instructions. Final Clinical Impressions(s) / UC Diagnoses   Final diagnoses:  Tick bite, initial encounter     Discharge Instructions     Take the antibiotic as instructed Return for problems   ED Prescriptions    Medication Sig Dispense Auth. Provider   doxycycline (VIBRAMYCIN) 100 MG capsule Take 1 capsule (100 mg total) by mouth 2 (two) times daily. 20 capsule Raylene Everts, MD     Controlled Substance Prescriptions [place] Controlled Substance Registry consulted? Not Applicable   Raylene Everts, MD 03/19/18 2025

## 2018-03-19 NOTE — ED Triage Notes (Signed)
Pt found tick on upper leg today.

## 2018-03-21 ENCOUNTER — Encounter (HOSPITAL_COMMUNITY): Payer: Self-pay | Admitting: Emergency Medicine

## 2018-03-21 ENCOUNTER — Ambulatory Visit (HOSPITAL_COMMUNITY)
Admission: EM | Admit: 2018-03-21 | Discharge: 2018-03-21 | Disposition: A | Discharge status: Home / Self Care | Payer: Medicare Other | Attending: Family Medicine | Admitting: Family Medicine

## 2018-03-21 DIAGNOSIS — R21 Rash and other nonspecific skin eruption: Secondary | ICD-10-CM | POA: Diagnosis not present

## 2018-03-21 MED ORDER — PREDNISONE 50 MG PO TABS: 50.0000 mg | ORAL_TABLET | Freq: Every day | ORAL | 0 refills | Status: AC

## 2018-03-21 NOTE — Discharge Instructions (Signed)
Please begin prednisone daily for the next 5 days  Please take antihistamines to help with itching as well, may take Zyrtec once or twice a day or may use Benadryl but this may cause sedation.  Complete course of doxycycline  Return if symptoms not improving or symptoms worsening, rash spreading, developing chest pain or shortness of breath.

## 2018-03-21 NOTE — ED Triage Notes (Signed)
Pt here for recheck of tick bite; pt is already on antibiotics

## 2018-03-21 NOTE — ED Provider Notes (Signed)
[facility]    CSN: [id number] Arrival date & time: 03/21/18  1537     History   Chief Complaint Chief Complaint  Patient presents with  . Tick Removal    HPI Michael Lindsey is a 67 y.o. male history of paroxysmal A. fib, osteoporosis presenting today for evaluation of a rash.  Patient is that he was bit by a tick on his right thigh and had this removed 2 days ago.  Since he has been taking doxycycline.  Later that evening he noticed a rash developing on his chest and believes the tick may have initially bit him there.  Noting significant itching.  Denies exposure to poison ivy or other new exposures.  Denies fever, headaches, numbness or tingling, chest pain or shortness of breath.  Denies being diabetic.  HPI  Past Medical History:  Diagnosis Date  . Fracture    L arm @ 10; 3 ribs with shoulder separation & PTX 2003  . Gastritis 09/13/2007   EGD  . GERD (gastroesophageal reflux disease)   . Hepatitis A 1972   from exposure to septic tank which malfunctioned  . Internal hemorrhoids 09/13/2007   COLON  . Osteoporosis   . Paroxysmal atrial fibrillation (HCC)    chads2vasc score is 1    Patient Active Problem List   Diagnosis Date Noted  . PAF (paroxysmal atrial fibrillation) ([place]) 11/17/2016  . Healthcare maintenance 10/20/2016  . Palpitations 09/24/2016  . Dyspnea 09/24/2016  . Fatigue 09/24/2016  . Eczema 11/03/2013  . Allergic rhinitis 06/01/2013  . Skin cancer 08/11/2012  . Dysphagia 12/08/2007  . Osteopenia 09/14/2007  . Esophageal reflux 04/13/2007    Past Surgical History:  Procedure Laterality Date  . cataract surgery  10/2014  . COLONOSCOPY  2011   Dr Sharlett Iles  . HERNIA REPAIR    . SKIN CANCER EXCISION  2011   R calf, Dr Jarome Matin  . UPPER GASTROINTESTINAL ENDOSCOPY  2003 & 2008    H pylori 2008       Home Medications    Prior to Admission medications   Medication Sig Start Date End Date Taking? Authorizing Provider    acetaminophen (TYLENOL) 325 MG tablet Take 650 mg by mouth daily as needed for mild pain, moderate pain or headache (pain).     [provider]  Ascorbic Acid (VITAMIN C) 100 MG tablet Take 100 mg by mouth daily.    [provider]  aspirin EC 81 MG tablet Take 2 tablets (162 mg total) by mouth daily. 01/20/17   Jettie Booze, MD  cholecalciferol (VITAMIN D) 1000 UNITS tablet Take 1,000 Units by mouth daily.    [provider]  doxycycline (VIBRAMYCIN) 100 MG capsule Take 1 capsule (100 mg total) by mouth 2 (two) times daily. 03/19/18   Raylene Everts, MD  Flaxseed, Linseed, (FLAXSEED OIL) 1000 MG CAPS Take by mouth as directed.     [provider]  metoprolol tartrate (LOPRESSOR) 25 MG tablet Take 0.5 tablets (12.5 mg total) by mouth 2 (two) times daily. 10/12/17 01/10/18  Jettie Booze, MD  predniSONE (DELTASONE) 50 MG tablet Take 1 tablet (50 mg total) by mouth daily for 5 days. 03/21/18 03/26/18  Cyrus Ramsburg C, PA-C  ranitidine (ZANTAC) 150 MG tablet Take 150 mg by mouth as needed for heartburn (Take as directed).     [provider]  thiamine (VITAMIN B-1) 100 MG tablet Take 100 mg by mouth daily.    [provider]  vitamin E 100 UNIT capsule Take 100 Units by mouth daily.     [provider]    Family History Family History  Problem Relation Age of Onset  . Depression Mother   . Tuberculosis Mother   . Coronary artery disease Father   . Emphysema Father   . Colon cancer Father 42  . Depression Brother   . Alcohol abuse Paternal Uncle   . Diabetes Neg Hx   . Stroke Neg Hx     Social History Social History   Tobacco Use  . Smoking status: Former Smoker    Last attempt to quit: 10/06/1980    Years since quitting: 37.4  . Smokeless tobacco: Never Used  . Tobacco comment: intermittent smoker over 4 years in college & again in 1983 before quitting, never > 2 mos @ a time,never > 1/2 ppd  Substance Use  Topics  . Alcohol use: No    Comment: SOCIALLY,  . Drug use: No     Allergies   Chlorine   Review of Systems Review of Systems  Constitutional: Negative for fatigue and fever.  Eyes: Negative for redness, itching and visual disturbance.  Respiratory: Negative for shortness of breath.   Cardiovascular: Negative for chest pain and leg swelling.  Gastrointestinal: Negative for nausea and vomiting.  Musculoskeletal: Negative for arthralgias and myalgias.  Skin: Positive for color change and rash. Negative for wound.  Neurological: Negative for dizziness, syncope, weakness, light-headedness and headaches.     Physical Exam Triage Vital Signs ED Triage Vitals [03/21/18 1545]  Enc Vitals Group     BP 112/69     Pulse Rate 71     Resp 18     Temp 97.9 F (36.6 C)     Temp Source Oral     SpO2 97 %     Weight      Height      Head Circumference      Peak Flow      Pain Score 0     Pain Loc      Pain Edu?      Excl. in [place]?    No data found.  Updated Vital Signs BP 112/69 (BP Location: Left Arm)   Pulse 71   Temp 97.9 F (36.6 C) (Oral)   Resp 18   SpO2 97%   Visual Acuity Right Eye Distance:   Left Eye Distance:   Bilateral Distance:    Right Eye Near:   Left Eye Near:    Bilateral Near:     Physical Exam  Constitutional: He appears well-developed and well-nourished.  HENT:  Head: Normocephalic and atraumatic.  Eyes: Conjunctivae are normal.  Neck: Neck supple.  Cardiovascular: Normal rate and regular rhythm.  No murmur heard. Pulmonary/Chest: Effort normal and breath sounds normal. No respiratory distress.  Abdominal: Soft. There is no tenderness.  Musculoskeletal: He exhibits no edema.  Neurological: He is alert.  Skin: Skin is warm and dry. Rash noted. There is erythema.  Small erythematous area to right upper thigh from previous tick removal, chest has maculopapular erythema to central chest as well as clear fluid-filled  vesicles.  Psychiatric:  He has a normal mood and affect.  Nursing note and vitals reviewed.       UC Treatments / Results  Labs (all labs ordered are listed, but only abnormal results are displayed) Labs Reviewed - No data to display  EKG None  Radiology No results found.  Procedures Procedures (  including critical care time)  Medications Ordered in UC Medications - No data to display  Initial Impression / Assessment and Plan / UC Course  I have reviewed the triage vital signs and the nursing notes.  Pertinent labs & imaging results that were available during my care of the patient were reviewed by me and considered in my medical decision making (see chart for details).     Rash on chest, possible contact dermatitis related to tick.  Will initiate prednisone 50 mg for 5 days to help with itching.  Also discussed use of antihistamines.  Continue doxycycline as prescribed.Discussed strict return precautions. Patient verbalized understanding and is agreeable with plan.  Final Clinical Impressions(s) / UC Diagnoses   Final diagnoses:  Rash and nonspecific skin eruption     Discharge Instructions     Please begin prednisone daily for the next 5 days  Please take antihistamines to help with itching as well, may take Zyrtec once or twice a day or may use Benadryl but this may cause sedation.  Complete course of doxycycline  Return if symptoms not improving or symptoms worsening, rash spreading, developing chest pain or shortness of breath.   ED Prescriptions    Medication Sig Dispense Auth. Provider   predniSONE (DELTASONE) 50 MG tablet Take 1 tablet (50 mg total) by mouth daily for 5 days. 5 tablet Nikcole Eischeid C, PA-C     Controlled Substance Prescriptions [place] Controlled Substance Registry consulted? Not Applicable   Janith Lima, [place] 03/21/18 1616

## 2018-03-22 ENCOUNTER — Ambulatory Visit (INDEPENDENT_AMBULATORY_CARE_PROVIDER_SITE_OTHER): Payer: Medicare Other | Admitting: Internal Medicine

## 2018-03-22 ENCOUNTER — Encounter: Payer: Self-pay | Admitting: Internal Medicine

## 2018-03-22 VITALS — BP 118/80 | HR 67 | Temp 97.8°F | Ht 72.0 in | Wt 171.0 lb

## 2018-03-22 DIAGNOSIS — M79605 Pain in left leg: Secondary | ICD-10-CM | POA: Diagnosis not present

## 2018-03-22 DIAGNOSIS — R21 Rash and other nonspecific skin eruption: Secondary | ICD-10-CM

## 2018-03-22 MED ORDER — METHYLPREDNISOLONE ACETATE 80 MG/ML IJ SUSP
80.0000 mg | Freq: Once | INTRAMUSCULAR | Status: AC
  Administered 2018-03-22: 80 mg via INTRAMUSCULAR

## 2018-03-22 NOTE — Patient Instructions (Signed)
We have given you the shot of the prednisone medicine today.   Take the prednisone and doxycycline until they are gone.   You can use benadryl at night time for itching.   You can use the cream you have 3 times per day. It is also okay to use benadryl cream or gel as many times as needed during the day.   Call us back if the rash is worsening. This could be a sensitivity reaction to the tick or the doxycycline.

## 2018-03-23 ENCOUNTER — Encounter: Payer: Self-pay | Admitting: Internal Medicine

## 2018-03-23 DIAGNOSIS — R21 Rash and other nonspecific skin eruption: Secondary | ICD-10-CM | POA: Insufficient documentation

## 2018-03-23 DIAGNOSIS — M79606 Pain in leg, unspecified: Secondary | ICD-10-CM | POA: Insufficient documentation

## 2018-03-23 NOTE — Progress Notes (Signed)
   Subjective:    Patient ID: Michael Lindsey, male    DOB: 08/28/1951, 67 y.o.   MRN: [id number]  HPI The patient is a 68 YO man coming in for ongoing rash as well as urgent care follow up (in for tick removal and prescribed doxycycline, then in for rash associated with this and given rx for prednisone day prior to visit). He is having rash on his chest with itching. He denies pain associated with this. The rash started the day before the tick on his leg. The tick was very engorged on his leg and removed appropriately. He has history of RMSF and is taking doxycycline for 10 days (about 4 days in). He denies fevers or chills. He is scratching some and trying not to itch. The rash did spread and worsen with starting doxycyline and is not clear if this is related. Denies poison ivy or other outdoor exposure, new soaps or lotion or detergent. Has been using betamethasone cream on it once which did not help much. Benadryl makes him sleepy so he has not taken.   Review of Systems  Constitutional: Negative.   HENT: Negative.   Eyes: Negative.   Respiratory: Negative for cough, chest tightness and shortness of breath.   Cardiovascular: Negative for chest pain, palpitations and leg swelling.  Gastrointestinal: Negative for abdominal distention, abdominal pain, constipation, diarrhea, nausea and vomiting.  Musculoskeletal: Negative.   Skin: Positive for color change and rash.  Neurological: Negative.   Psychiatric/Behavioral: Negative.       Objective:   Physical Exam  Constitutional: He is oriented to person, place, and time. He appears well-developed and well-nourished.  HENT:  Head: Normocephalic and atraumatic.  Eyes: EOM are normal.  Neck: Normal range of motion.  Cardiovascular: Normal rate and regular rhythm.  Pulmonary/Chest: Effort normal and breath sounds normal. No respiratory distress. He has no wheezes. He has no rales.  Abdominal: Soft. Bowel sounds are normal. He exhibits no  distension. There is no tenderness. There is no rebound.  Musculoskeletal: He exhibits no edema.  Neurological: He is alert and oriented to person, place, and time. Coordination normal.  Skin: Skin is warm and dry. Rash noted.  Rash crosses midline top of sternum below the pec region, some stigmata of scratching, mild serous discharge, no pain and blanches, about 6-7 cm spread more on the left chest than right.   Psychiatric: He has a normal mood and affect.   Vitals:   03/22/18 1546  BP: 118/80  Pulse: 67  Temp: 97.8 F (36.6 C)  TempSrc: Oral  SpO2: 97%  Weight: 171 lb (77.6 kg)  Height: 6' (1.829 m)      Assessment & Plan:  Depo-medrol 80 mg IM given at visit.

## 2018-03-23 NOTE — Assessment & Plan Note (Signed)
Due to tick bite which is removed. I suspect he is having a drug reaction with rash to doxycyline. We talked about stopping the medicine now but he feels strongly about finishing due to personal history of RMSF. He currently has no symptoms of RMSF.

## 2018-03-23 NOTE — Assessment & Plan Note (Signed)
Can use betamethasone cream TID for 1 week on the rash. Okay with using benadryl cream as needed. Benadryl at night time to help sleep with itching. Can use zyrtec BID to help itching without drowsiness. Given depo-medrol 80 mg IM at visit and encouraged to finish prednisone and doxycycline. We talked about possibility of drug reaction to doxycycline and he feels strongly about finishing this due to past RMSF. We talked also about possibility of shingles however this is not typical (crosses midline, no blistering, no pain). Monitor and if worsening can re-evaluate.

## 2018-03-31 ENCOUNTER — Ambulatory Visit: Payer: Self-pay

## 2018-03-31 NOTE — Telephone Encounter (Signed)
Phone call from pt.  Reported one week hx of mid, lower abdominal pain. Describes the discomfort as "like when you get a stitch in your side."  Located about 1-2" below the umbilicus.  Stated the pain comes and goes, and is mild.  Reported it does not seem to correlate with eating, and actually improves after eating.  Stated has intermittent "queezy" feeling, but not full nausea.  Denied vomiting.  Denied fever/ chills.  Denied tenderness with palpation of the area. Stated his bowel pattern is normal and having good evacuations.  Denied any change in color of stool.    Called [facility] and rec'd approval to sched. In a same day appt. slot tomorrow.  Appt. sched. with PCP at 3:30 PM on 6/27.  Pt. Agrees w/ plan.   Care advice given per protocol.  Verb. Understanding; agrees with plan.     Reason for Disposition . [1] MODERATE pain (e.g., interferes with normal activities) AND [2] pain comes and goes (cramps) AND [3] present > 24 hours  (Exception: pain with Vomiting or Diarrhea - see that Guideline)  Answer Assessment - Initial Assessment Questions 1. LOCATION: "Where does it hurt?"      About 2 " below the umbilicus 2. RADIATION: "Does the pain shoot anywhere else?" (e.g., chest, back)     denied 3. ONSET: "When did the pain begin?" (Minutes, hours or days ago)      About one week ago  4. SUDDEN: "Gradual or sudden onset?"     sudden 5. PATTERN "Does the pain come and go, or is it constant?"    - If constant: "Is it getting better, staying the same, or worsening?"      (Note: Constant means the pain never goes away completely; most serious pain is constant and it progresses)     - If intermittent: "How long does it last?" "Do you have pain now?"     (Note: Intermittent means the pain goes away completely between bouts)     Comes and goes 6. SEVERITY: "How bad is the pain?"  (e.g., Scale 1-10; mild, moderate, or severe)    - MILD (1-3): doesn't interfere with normal activities, abdomen soft and not  tender to touch     - MODERATE (4-7): interferes with normal activities or awakens from sleep, tender to touch     - SEVERE (8-10): excruciating pain, doubled over, unable to do any normal activities       mild 7. RECURRENT SYMPTOM: "Have you ever had this type of abdominal pain before?" If so, ask: "When was the last time?" and "What happened that time?"      Hx of tear in lower abdomen about 15 yrs. Ago; otherwise denies prior episode  8. CAUSE: "What do you think is causing the abdominal pain?"     Unknown; poss muscular strain  9. RELIEVING/AGGRAVATING FACTORS: "What makes it better or worse?" (e.g., movement, antacids, bowel movement)     Zantac has helped some; denies any localized tenderness 10. OTHER SYMPTOMS: "Has there been any vomiting, diarrhea, constipation, or urine problems?"       A little queezy; denied fever or chills  Protocols used: ABDOMINAL PAIN - MALE-A-AH

## 2018-04-01 ENCOUNTER — Ambulatory Visit: Payer: Medicare Other | Admitting: Nurse Practitioner

## 2018-04-23 ENCOUNTER — Telehealth (INDEPENDENT_AMBULATORY_CARE_PROVIDER_SITE_OTHER): Payer: Self-pay | Admitting: Orthopaedic Surgery

## 2018-04-23 NOTE — Telephone Encounter (Signed)
Patient called stating he had Euflexxa injections in his knees last year and it worked very well.  Patient scheduled an appointment to see Dr. Durward Fortes on 04/29/18 to discuss getting the Euflexxa injections again in his knees.  Patient states he will cancel the appointment if Dr. Durward Fortes doesn't need an appointment before ordering the Euflexxa injections.

## 2018-04-23 NOTE — Telephone Encounter (Signed)
Please advise 

## 2018-04-26 NOTE — Telephone Encounter (Signed)
Does not need an appt-pre cert Euflexxa. Please call Michael Lindsey and relate that we will call once the med has been approved

## 2018-04-26 NOTE — Telephone Encounter (Signed)
Sent visco suppl to April for bi lat knee visco injections

## 2018-04-29 ENCOUNTER — Ambulatory Visit (INDEPENDENT_AMBULATORY_CARE_PROVIDER_SITE_OTHER): Payer: Self-pay | Admitting: Orthopaedic Surgery

## 2018-05-10 ENCOUNTER — Encounter: Payer: Self-pay | Admitting: Nurse Practitioner

## 2018-05-10 DIAGNOSIS — Z800 Family history of malignant neoplasm of digestive organs: Secondary | ICD-10-CM | POA: Diagnosis not present

## 2018-05-10 DIAGNOSIS — Z1211 Encounter for screening for malignant neoplasm of colon: Secondary | ICD-10-CM | POA: Diagnosis not present

## 2018-05-10 DIAGNOSIS — D126 Benign neoplasm of colon, unspecified: Secondary | ICD-10-CM | POA: Diagnosis not present

## 2018-05-10 LAB — HM COLONOSCOPY

## 2018-05-12 ENCOUNTER — Telehealth (INDEPENDENT_AMBULATORY_CARE_PROVIDER_SITE_OTHER): Payer: Self-pay

## 2018-05-12 DIAGNOSIS — Z961 Presence of intraocular lens: Secondary | ICD-10-CM | POA: Diagnosis not present

## 2018-05-12 DIAGNOSIS — H26491 Other secondary cataract, right eye: Secondary | ICD-10-CM | POA: Diagnosis not present

## 2018-05-12 DIAGNOSIS — H43813 Vitreous degeneration, bilateral: Secondary | ICD-10-CM | POA: Diagnosis not present

## 2018-05-12 DIAGNOSIS — D126 Benign neoplasm of colon, unspecified: Secondary | ICD-10-CM | POA: Diagnosis not present

## 2018-05-12 NOTE — Telephone Encounter (Signed)
Submitted for VOB, SynviscOne, bilateral knee. 

## 2018-05-14 DIAGNOSIS — Z85828 Personal history of other malignant neoplasm of skin: Secondary | ICD-10-CM | POA: Diagnosis not present

## 2018-05-14 DIAGNOSIS — D692 Other nonthrombocytopenic purpura: Secondary | ICD-10-CM | POA: Diagnosis not present

## 2018-05-24 ENCOUNTER — Ambulatory Visit: Payer: Medicare Other | Admitting: Nurse Practitioner

## 2018-05-25 ENCOUNTER — Telehealth (INDEPENDENT_AMBULATORY_CARE_PROVIDER_SITE_OTHER): Payer: Self-pay

## 2018-05-25 NOTE — Telephone Encounter (Signed)
Patient is approved for SynviscOne, bilateral knee. Buy & Bill No Co-pay No PA required Covered at 100% after deductible and medicare pays.  Please schedule patient appointment with Dr. Durward Fortes. Thank you.

## 2018-05-25 NOTE — Telephone Encounter (Signed)
I spoke with patient . He will call back to schedule once available.

## 2018-06-04 ENCOUNTER — Encounter: Payer: Self-pay | Admitting: Nurse Practitioner

## 2018-06-09 ENCOUNTER — Encounter (INDEPENDENT_AMBULATORY_CARE_PROVIDER_SITE_OTHER): Payer: Self-pay | Admitting: Orthopedic Surgery

## 2018-06-09 ENCOUNTER — Ambulatory Visit (INDEPENDENT_AMBULATORY_CARE_PROVIDER_SITE_OTHER): Payer: Medicare Other | Admitting: Orthopedic Surgery

## 2018-06-09 DIAGNOSIS — M1711 Unilateral primary osteoarthritis, right knee: Secondary | ICD-10-CM | POA: Diagnosis not present

## 2018-06-09 MED ORDER — LIDOCAINE HCL 1 % IJ SOLN
2.0000 mL | INTRAMUSCULAR | Status: AC | PRN
  Administered 2018-06-09: 2 mL

## 2018-06-09 MED ORDER — HYLAN G-F 20 48 MG/6ML IX SOSY
48.0000 mg | PREFILLED_SYRINGE | INTRA_ARTICULAR | Status: AC | PRN
  Administered 2018-06-09: 48 mg via INTRA_ARTICULAR

## 2018-06-09 NOTE — Progress Notes (Signed)
Office Visit Note   Patient: Michael Lindsey           Date of Birth: 12/18/1950           MRN: [id number] Visit Date: 06/09/2018              Requested by: Michael Sell, NP [address], [place] [id number] PCP: Michael Sell, NP   Assessment & Plan: Visit Diagnoses:  1. Unilateral primary osteoarthritis, right knee     Plan:  #1: Synvisc 1 was instilled into the right knee without difficulty. #2: We will see in 1 week for injection to the left knee with Synvisc 1  Follow-Up Instructions: Return in about 1 week (around 06/16/2018).   Orders:  No orders of the defined types were placed in this encounter.  No orders of the defined types were placed in this encounter.     Procedures: Large Joint Inj: R knee on 06/09/2018 9:21 AM Indications: pain and joint swelling Details: 22 G 1.5 in needle, anteromedial approach  Arthrogram: No  Medications: 2 mL lidocaine 1 %; 48 mg Hylan 48 MG/6ML Outcome: tolerated well, no immediate complications Procedure, treatment alternatives, risks and benefits explained, specific risks discussed. Consent was given by the patient. Immediately prior to procedure a time out was called to verify the correct patient, procedure, equipment, support staff and site/side marked as required. Patient was prepped and draped in the usual sterile fashion.       Clinical Data: No additional findings.   Subjective: Chief Complaint  Patient presents with  . Right Knee - Pain  . Knee Pain    Synvisc One Right knee worse than left    HPI  Michael Lindsey is a very pleasant 67 year old white male who is seen today for evaluation of both knees.  He has previously had an arthroscopy in 2015 with a partial medial meniscectomy and microfracture of the medial femoral condyle for a dime size lesion of grade IV chondromalacia.  Seen back in 2018 cortisone injection was given as well as Euflexxa injections which were quite  beneficial.  He does play tennis and he also swims.  He did well until most recently when he started having symptoms again in his knees.  He did have bilateral injections previously he would like to proceed with this.  Application was made and his insurance is chosen Synvisc 1 to be the use.  He is here today for his right knee injection.   Review of Systems  Constitutional: Negative.   HENT: Negative.   Respiratory: Negative.   Cardiovascular: Negative.   Gastrointestinal: Negative.   Genitourinary: Positive for difficulty urinating.  Skin: Negative.   Neurological: Negative.   Hematological: Negative.   Psychiatric/Behavioral: Negative.      Objective: Vital Signs: There were no vitals taken for this visit.  Physical Exam  Constitutional: He is oriented to person, place, and time. He appears well-developed and well-nourished.  HENT:  Mouth/Throat: Oropharynx is clear and moist.  Eyes: Pupils are equal, round, and reactive to light. EOM are normal.  Pulmonary/Chest: Effort normal.  Neurological: He is alert and oriented to person, place, and time.  Skin: Skin is warm and dry.  Psychiatric: He has a normal mood and affect. His behavior is normal.    Ortho Exam  Today he has range of motion from near full extension to 105 degrees.  Effusion.  Does have some crepitance with range of motion.  Calf  is supple nontender.  Neurovascular intact distally.  Specialty Comments:  No specialty comments available.  Imaging: No results found.   PMFS History: Patient Active Problem List   Diagnosis Date Noted  . Rash 03/23/2018  . Leg pain 03/23/2018  . PAF (paroxysmal atrial fibrillation) ([place]) 11/17/2016  . Healthcare maintenance 10/20/2016  . Palpitations 09/24/2016  . Dyspnea 09/24/2016  . Fatigue 09/24/2016  . Eczema 11/03/2013  . Allergic rhinitis 06/01/2013  . Skin cancer 08/11/2012  . Dysphagia 12/08/2007  . Osteopenia 09/14/2007  . Esophageal reflux 04/13/2007    Past Medical History:  Diagnosis Date  . Fracture    L arm @ 10; 3 ribs with shoulder separation & PTX 2003  . Gastritis 09/13/2007   EGD  . GERD (gastroesophageal reflux disease)   . Hepatitis A 1972   from exposure to septic tank which malfunctioned  . Internal hemorrhoids 09/13/2007   COLON  . Osteoporosis   . Paroxysmal atrial fibrillation ([facility])    chads2vasc score is 1    Family History  Problem Relation Age of Onset  . Depression Mother   . Tuberculosis Mother   . Coronary artery disease Father   . Emphysema Father   . Colon cancer Father 86  . Depression Brother   . Alcohol abuse Paternal Uncle   . Diabetes Neg Hx   . Stroke Neg Hx     Past Surgical History:  Procedure Laterality Date  . cataract surgery  10/2014  . COLONOSCOPY  2011   Dr Sharlett Iles  . HERNIA REPAIR    . SKIN CANCER EXCISION  2011   R calf, Dr Jarome Matin  . UPPER GASTROINTESTINAL ENDOSCOPY  2003 & 2008    H pylori 2008   Social History   Occupational History  . Occupation: IMMIGRATION ATTORNEY  Tobacco Use  . Smoking status: Former Smoker    Last attempt to quit: 10/06/1980    Years since quitting: 37.6  . Smokeless tobacco: Never Used  . Tobacco comment: intermittent smoker over 4 years in college & again in 1983 before quitting, never > 2 mos @ a time,never > 1/2 ppd  Substance and Sexual Activity  . Alcohol use: No    Comment: SOCIALLY,  . Drug use: No  . Sexual activity: Not on file

## 2018-06-16 ENCOUNTER — Ambulatory Visit (INDEPENDENT_AMBULATORY_CARE_PROVIDER_SITE_OTHER): Payer: Medicare Other | Admitting: Orthopedic Surgery

## 2018-06-18 ENCOUNTER — Ambulatory Visit (INDEPENDENT_AMBULATORY_CARE_PROVIDER_SITE_OTHER): Payer: Self-pay

## 2018-06-18 ENCOUNTER — Ambulatory Visit (INDEPENDENT_AMBULATORY_CARE_PROVIDER_SITE_OTHER): Payer: Medicare Other | Admitting: Orthopaedic Surgery

## 2018-06-18 ENCOUNTER — Encounter (INDEPENDENT_AMBULATORY_CARE_PROVIDER_SITE_OTHER): Payer: Self-pay | Admitting: Orthopaedic Surgery

## 2018-06-18 VITALS — BP 96/61 | HR 63 | Ht 72.0 in | Wt 167.5 lb

## 2018-06-18 DIAGNOSIS — G8929 Other chronic pain: Secondary | ICD-10-CM

## 2018-06-18 DIAGNOSIS — M25561 Pain in right knee: Secondary | ICD-10-CM

## 2018-06-18 DIAGNOSIS — M25562 Pain in left knee: Secondary | ICD-10-CM

## 2018-06-18 DIAGNOSIS — M170 Bilateral primary osteoarthritis of knee: Secondary | ICD-10-CM | POA: Insufficient documentation

## 2018-06-18 MED ORDER — HYLAN G-F 20 48 MG/6ML IX SOSY
48.0000 mg | PREFILLED_SYRINGE | INTRA_ARTICULAR | Status: AC | PRN
  Administered 2018-06-18: 48 mg via INTRA_ARTICULAR

## 2018-06-18 MED ORDER — LIDOCAINE HCL 1 % IJ SOLN
2.0000 mL | INTRAMUSCULAR | Status: AC | PRN
  Administered 2018-06-18: 2 mL

## 2018-06-18 NOTE — Progress Notes (Signed)
Office Visit Note   Patient: Michael Lindsey           Date of Birth: 1950/11/08           MRN: [id number] Visit Date: 06/18/2018              Requested by: Lance Sell, NP [address] [place], [place] [phone] PCP: Lance Sell, NP   Assessment & Plan: Visit Diagnoses:  1. Chronic pain of both knees   2. Bilateral primary osteoarthritis of knee     Plan: Synvisc 1 injection left knee.  Follow-up as needed  Follow-Up Instructions: Return if symptoms worsen or fail to improve.   Orders:  Orders Placed This Encounter  Procedures  . Large Joint Inj: L knee   No orders of the defined types were placed in this encounter.     Procedures: Large Joint Inj: L knee on 06/18/2018 12:11 PM Indications: pain and joint swelling Details: 25 G 1.5 in needle, anteromedial approach  Arthrogram: No  Medications: 2 mL lidocaine 1 %; 48 mg Hylan 48 MG/6ML Outcome: tolerated well, no immediate complications Procedure, treatment alternatives, risks and benefits explained, specific risks discussed. Consent was given by the patient. Immediately prior to procedure a time out was called to verify the correct patient, procedure, equipment, support staff and site/side marked as required. Patient was prepped and draped in the usual sterile fashion.       Clinical Data: No additional findings.   Subjective: Chief Complaint  Patient presents with  . Follow-up    bil lat knee pain had # 1 synvisc injection in right knee last week w brian. would like #1 synvisc left knee injection today    HPI  Review of Systems  Constitutional: Negative for fatigue and fever.  HENT: Negative for ear pain.   Eyes: Negative for pain.  Respiratory: Negative for cough and shortness of breath.   Cardiovascular: Negative for leg swelling.  Gastrointestinal: Negative for constipation and diarrhea.  Genitourinary: Negative for difficulty urinating.  Musculoskeletal:  Negative for back pain and neck pain.  Skin: Negative for rash.  Allergic/Immunologic: Negative for food allergies.  Neurological: Positive for weakness. Negative for numbness.  Hematological: Bruises/bleeds easily.  Psychiatric/Behavioral: Negative for sleep disturbance.     Objective: Vital Signs: BP 96/61 (BP Location: Left Arm, Patient Position: Sitting, Cuff Size: Normal)   Pulse 63   Ht 6' (1.829 m)   Wt 167 lb 8 oz (76 kg)   BMI 22.72 kg/m   Physical Exam  Ortho Exam neither knee is effused.  Neither knee is hot red or swollen.  Specialty Comments:  No specialty comments available.  Imaging: No results found.   PMFS History: Patient Active Problem List   Diagnosis Date Noted  . Bilateral primary osteoarthritis of knee 06/18/2018  . Rash 03/23/2018  . Leg pain 03/23/2018  . PAF (paroxysmal atrial fibrillation) ([place]) 11/17/2016  . Healthcare maintenance 10/20/2016  . Palpitations 09/24/2016  . Dyspnea 09/24/2016  . Fatigue 09/24/2016  . Eczema 11/03/2013  . Allergic rhinitis 06/01/2013  . Skin cancer 08/11/2012  . Dysphagia 12/08/2007  . Osteopenia 09/14/2007  . Esophageal reflux 04/13/2007   Past Medical History:  Diagnosis Date  . Fracture    L arm @ 11; 3 ribs with shoulder separation & PTX 2003  . Gastritis 09/13/2007   EGD  . GERD (gastroesophageal reflux disease)   . Hepatitis A 1972   from exposure to septic  tank which malfunctioned  . Internal hemorrhoids 09/13/2007   COLON  . Osteoporosis   . Paroxysmal atrial fibrillation ([facility])    chads2vasc score is 1    Family History  Problem Relation Age of Onset  . Depression Mother   . Tuberculosis Mother   . Coronary artery disease Father   . Emphysema Father   . Colon cancer Father 50  . Depression Brother   . Alcohol abuse Paternal Uncle   . Diabetes Neg Hx   . Stroke Neg Hx     Past Surgical History:  Procedure Laterality Date  . cataract surgery  10/2014  . COLONOSCOPY  2011   Dr  Sharlett Iles  . HERNIA REPAIR    . SKIN CANCER EXCISION  2011   R calf, Dr Jarome Matin  . UPPER GASTROINTESTINAL ENDOSCOPY  2003 & 2008    H pylori 2008   Social History   Occupational History  . Occupation: IMMIGRATION ATTORNEY  Tobacco Use  . Smoking status: Former Smoker    Last attempt to quit: 10/06/1980    Years since quitting: 37.7  . Smokeless tobacco: Former Systems developer    Quit date: 1982  . Tobacco comment: intermittent smoker over 4 years in high school & again in 1983 before quitting, never > 2 mos @ a time,never > 1/2 ppd  Substance and Sexual Activity  . Alcohol use: No    Comment: SOCIALLY,  . Drug use: No  . Sexual activity: Not on file

## 2018-07-15 DIAGNOSIS — Z23 Encounter for immunization: Secondary | ICD-10-CM | POA: Diagnosis not present

## 2018-09-04 ENCOUNTER — Other Ambulatory Visit: Payer: Self-pay | Admitting: Interventional Cardiology

## 2018-09-22 NOTE — Progress Notes (Signed)
Cardiology Office Note   Date:  09/23/2018   ID:  [name], DOB [birth year], MRN [id number]  PCP:  Michael Sell, NP    No chief complaint on file.  AFib  Wt Readings from Last 3 Encounters:  09/23/18 174 lb (78.9 kg)  06/18/18 167 lb 8 oz (76 kg)  03/22/18 171 lb (77.6 kg)       History of Present Illness: Michael Lindsey is a 67 y.o. male  Who had palpitations and was found to have PAF.  His wife was watching him for sleep apnea.She has not seen any. He does snore, worse with alcohol consumption.  His watch recorded some fast heart rate during sleep on one night.  Sleep study was done in 8/18.   Since the last visit, she has had several episodes of AFib.  He has had episodes with stress and when he takes his metoprolol later than usual.    Denies : Chest pain. Dizziness. Leg edema. Nitroglycerin use. Orthopnea.  Paroxysmal nocturnal dyspnea. Shortness of breath. Syncope.   Still exercising vigorously.    Past Medical History:  Diagnosis Date  . Fracture    L arm @ 11; 3 ribs with shoulder separation & PTX 2003  . Gastritis 09/13/2007   EGD  . GERD (gastroesophageal reflux disease)   . Hepatitis A 1972   from exposure to septic tank which malfunctioned  . Internal hemorrhoids 09/13/2007   COLON  . Osteoporosis   . Paroxysmal atrial fibrillation ([facility])    chads2vasc score is 1    Past Surgical History:  Procedure Laterality Date  . cataract surgery  10/2014  . COLONOSCOPY  2011   Dr Sharlett Iles  . HERNIA REPAIR    . SKIN CANCER EXCISION  2011   R calf, Dr Jarome Matin  . UPPER GASTROINTESTINAL ENDOSCOPY  2003 & 2008    H pylori 2008     Current Outpatient Medications  Medication Sig Dispense Refill  . acetaminophen (TYLENOL) 325 MG tablet Take 650 mg by mouth daily as needed for mild pain, moderate pain or headache (pain).     . Ascorbic Acid (VITAMIN C) 100 MG tablet Take 100 mg by mouth daily.    Marland Kitchen aspirin EC 81 MG tablet Take  2 tablets (162 mg total) by mouth daily. 180 tablet 3  . cholecalciferol (VITAMIN D) 1000 UNITS tablet Take 1,000 Units by mouth daily.    . Flaxseed, Linseed, (FLAXSEED OIL) 1000 MG CAPS Take by mouth as directed.     . lansoprazole (PREVACID) 15 MG capsule Take 15 mg by mouth as needed.    . metoprolol tartrate (LOPRESSOR) 25 MG tablet TAKE 1/2 TABLET(12.5 MG) BY MOUTH TWICE DAILY 90 tablet 0  . thiamine (VITAMIN B-1) 100 MG tablet Take 100 mg by mouth daily.    . vitamin E 100 UNIT capsule Take 100 Units by mouth daily.      No current facility-administered medications for this visit.     Allergies:   Chlorine    Social History:  The patient  reports that he quit smoking about 37 years ago. He quit smokeless tobacco use about 37 years ago. He reports that he does not drink alcohol or use drugs.   Family History:  The patient's family history includes Alcohol abuse in his paternal uncle; Colon cancer (age of onset: 38) in his father; Coronary artery disease in his father; Depression in his brother and mother; Emphysema in his father; Tuberculosis  in his mother.    ROS:  Please see the history of present illness.   Otherwise, review of systems are positive for palpitations.   All other systems are reviewed and negative.    PHYSICAL EXAM: VS:  BP 100/62   Pulse (!) 57   Ht 6' (1.829 m)   Wt 174 lb (78.9 kg)   SpO2 99%   BMI 23.60 kg/m  , BMI Body mass index is 23.6 kg/m. GEN: Well nourished, well developed, in no acute distress  HEENT: normal  Neck: no JVD, carotid bruits, or masses Cardiac: irregularly irregular, normal rate; no murmurs, rubs, or gallops,no edema  Respiratory:  clear to auscultation bilaterally, normal work of breathing GI: soft, nontender, nondistended, + BS MS: no deformity or atrophy  Skin: warm and dry, no rash Neuro:  Strength and sensation are intact Psych: euthymic mood, full affect   EKG:   The ekg ordered today demonstrates AFib, rate  controlled   Recent Labs: 10/28/2017: ALT 16; BUN 11; Creatinine, Ser 0.88; Hemoglobin 15.3; Platelets 211.0; Potassium 4.2; Sodium 141; TSH 1.91   Lipid Panel    Component Value Date/Time   CHOL 165 10/28/2017 0854   TRIG 70.0 10/28/2017 0854   HDL 66.20 10/28/2017 0854   CHOLHDL 2 10/28/2017 0854   VLDL 14.0 10/28/2017 0854   LDLCALC 85 10/28/2017 0854     Other studies Reviewed: Additional studies/ records that were reviewed today with results demonstrating: EP note from Jan 2019.   ASSESSMENT AND PLAN:  1. PAF: Started low dose metoprolol.  Now having more AFib based on his Apple watch and symptoms.  He does not always feel his AFib.  Will stop aspirin and start Eliquis 5 mg BID for stroke prevention.  Looks like he discussed anticoagulation as well with Dr. Rayann Heman in January 2019.  He is now willing to be on Eliquis.  Low risk o bleeding.   This patients CHA2DS2-VASc Score and unadjusted Ischemic Stroke Rate (% per year) is equal to 0.6 % stroke rate/year from a score of 1  Above score calculated as 1 point each if present [CHF, HTN, DM, Vascular=MI/PAD/Aortic Plaque, Age if 65-74, or Male] Above score calculated as 2 points each if present [Age > 75, or Stroke/TIA/TE]     Current medicines are reviewed at length with the patient today.  The patient concerns regarding his medicines were addressed.  The following changes have been made:  Start Eliquis  Labs/ tests ordered today include:  No orders of the defined types were placed in this encounter.   Recommend 150 minutes/week of aerobic exercise Low fat, low carb, high fiber diet recommended  Disposition:   FU in 3-4 months   Signed, Larae Grooms, MD  09/23/2018 11:48 AM    [facility] [address], [place], [place]  [address] Phone: [phone]; Fax: [phone]

## 2018-09-23 ENCOUNTER — Ambulatory Visit (INDEPENDENT_AMBULATORY_CARE_PROVIDER_SITE_OTHER): Payer: Medicare Other | Admitting: Interventional Cardiology

## 2018-09-23 ENCOUNTER — Encounter: Payer: Self-pay | Admitting: Interventional Cardiology

## 2018-09-23 VITALS — BP 100/62 | HR 57 | Ht 72.0 in | Wt 174.0 lb

## 2018-09-23 DIAGNOSIS — I480 Paroxysmal atrial fibrillation: Secondary | ICD-10-CM

## 2018-09-23 MED ORDER — APIXABAN 5 MG PO TABS: 5.0000 mg | ORAL_TABLET | Freq: Two times a day (BID) | ORAL | 11 refills | Status: DC

## 2018-09-23 NOTE — Patient Instructions (Signed)
Medication Instructions:  Your physician has recommended you make the following change in your medication:   1. STOP: Aspirin  2. START: eliquis 5 mg tablet: Take 1 tablet by mouth twice a day   If you need a refill on your cardiac medications before your next appointment, please call your pharmacy.   Lab work: TODAY: CBC, BMET  If you have labs (blood work) drawn today and your tests are completely normal, you will receive your results only by: Marland Kitchen MyChart Message (if you have MyChart) OR . A paper copy in the mail If you have any lab test that is abnormal or we need to change your treatment, we will call you to review the results.  Testing/Procedures: None ordered  Follow-Up: At [facility], you and your health needs are our priority.  As part of our continuing mission to provide you with exceptional heart care, we have created designated Provider Care Teams.  These Care Teams include your primary Cardiologist (physician) and Advanced Practice Providers (APPs -  Physician Assistants and Nurse Practitioners) who all work together to provide you with the care you need, when you need it. . You will need a follow up appointment in 3 months.  Please call our office 2 months in advance to schedule this appointment.  You may see Casandra Doffing, MD or one of the following Advanced Practice Providers on your designated Care Team:   . Lyda Jester, PA-C . Dayna Dunn, PA-C . Ermalinda Barrios, PA-C  Any Other Special Instructions Will Be Listed Below (If Applicable).   Atrial Fibrillation  Atrial fibrillation is a type of heartbeat that is irregular or fast (rapid). If you have this condition, your heart beats without any order. This makes it hard for your heart to pump blood in a normal way. Having this condition gives you more risk for stroke, heart failure, and other heart problems. Atrial fibrillation may start all of a sudden and then stop on its own, or it may become a long-lasting  problem. What are the causes? This condition may be caused by heart conditions, such as:  High blood pressure.  Heart failure.  Heart valve disease.  Heart surgery. Other causes include:  Pneumonia.  Obstructive sleep apnea.  Lung cancer.  Thyroid disease.  Drinking too much alcohol. Sometimes the cause is not known. What increases the risk? You are more likely to develop this condition if:  You smoke.  You are older.  You have diabetes.  You are overweight.  You have a family history of this condition.  You exercise often and hard. What are the signs or symptoms? Common symptoms of this condition include:  A feeling like your heart is beating very fast.  Chest pain.  Feeling short of breath.  Feeling light-headed or weak.  Getting tired easily. Follow these instructions at home: Medicines  Take over-the-counter and prescription medicines only as told by your doctor.  If your doctor gives you a blood-thinning medicine, take it exactly as told. Taking too much of it can cause bleeding. Taking too little of it does not protect you against clots. Clots can cause a stroke. Lifestyle      Do not use any tobacco products. These include cigarettes, chewing tobacco, and e-cigarettes. If you need help quitting, ask your doctor.  Do not drink alcohol.  Do not drink beverages that have caffeine. These include coffee, soda, and tea.  Follow diet instructions as told by your doctor.  Exercise regularly as told by your  doctor. General instructions  If you have a condition that causes breathing to stop for a short period of time (apnea), treat it as told by your doctor.  Keep a healthy weight. Do not use diet pills unless your doctor says they are safe for you. Diet pills may make heart problems worse.  Keep all follow-up visits as told by your doctor. This is important. Contact a doctor if:  You notice a change in the speed, rhythm, or strength of your  heartbeat.  You are taking a blood-thinning medicine and you see more bruising.  You get tired more easily when you move or exercise.  You have a sudden change in weight. Get help right away if:   You have pain in your chest or your belly (abdomen).  You have trouble breathing.  You have blood in your vomit, poop, or pee (urine).  You have any signs of a stroke. "BE FAST" is an easy way to remember the main warning signs: ? B - Balance. Signs are dizziness, sudden trouble walking, or loss of balance. ? E - Eyes. Signs are trouble seeing or a change in how you see. ? F - Face. Signs are sudden weakness or loss of feeling in the face, or the face or eyelid drooping on one side. ? A - Arms. Signs are weakness or loss of feeling in an arm. This happens suddenly and usually on one side of the body. ? S - Speech. Signs are sudden trouble speaking, slurred speech, or trouble understanding what people say. ? T - Time. Time to call emergency services. Write down what time symptoms started.  You have other signs of a stroke, such as: ? A sudden, very bad headache with no known cause. ? Feeling sick to your stomach (nausea). ? Throwing up (vomiting). ? Jerky movements you cannot control (seizure). These symptoms may be an emergency. Do not wait to see if the symptoms will go away. Get medical help right away. Call your local emergency services (911 in the [place] Do not drive yourself to the hospital. Summary  Atrial fibrillation is a type of heartbeat that is irregular or fast (rapid).  You are at higher risk of this condition if you smoke, are older, have diabetes, or are overweight.  Follow your doctor's instructions about medicines, diet, exercise, and follow-up visits.  Get help right away if you think that you have signs of a stroke. This information is not intended to replace advice given to you by your health care provider. Make sure you discuss any questions you have with your  health care provider. Document Released: 07/01/2008 Document Revised: 11/13/2017 Document Reviewed: 11/13/2017 Elsevier Interactive Patient Education  2019 [employer].

## 2018-09-24 LAB — CBC
Hematocrit: 46.8 % (ref 37.5–51.0)
Hemoglobin: 16.2 g/dL (ref 13.0–17.7)
MCH: 33.4 pg — ABNORMAL HIGH (ref 26.6–33.0)
MCHC: 34.6 g/dL (ref 31.5–35.7)
MCV: 97 fL (ref 79–97)
Platelets: 226 10*3/uL (ref 150–450)
RBC: 4.85 x10E6/uL (ref 4.14–5.80)
RDW: 13.1 % (ref 12.3–15.4)
WBC: 7 10*3/uL (ref 3.4–10.8)

## 2018-09-24 LAB — BASIC METABOLIC PANEL
BUN/Creatinine Ratio: 14 (ref 10–24)
BUN: 11 mg/dL (ref 8–27)
CALCIUM: 9.5 mg/dL (ref 8.6–10.2)
CHLORIDE: 101 mmol/L (ref 96–106)
CO2: 22 mmol/L (ref 20–29)
Creatinine, Ser: 0.79 mg/dL (ref 0.76–1.27)
GFR calc Af Amer: 107 mL/min/{1.73_m2} (ref >59)
GFR, EST NON AFRICAN AMERICAN: 92 mL/min/{1.73_m2} (ref >59)
Glucose: 80 mg/dL (ref 65–99)
Potassium: 4.8 mmol/L (ref 3.5–5.2)
Sodium: 137 mmol/L (ref 134–144)

## 2018-09-26 ENCOUNTER — Other Ambulatory Visit: Payer: Self-pay | Admitting: Interventional Cardiology

## 2018-10-03 NOTE — Progress Notes (Signed)
Please inform patient that Dr. Rayann Heman agreed with Eliquis.

## 2018-10-05 NOTE — Progress Notes (Signed)
Patient made aware that Dr. Rayann Heman was in agreement with him taking Eliquis. Patient states that he has been taking it, has stopped his ASA, and denies having any issues at this time.

## 2018-10-25 ENCOUNTER — Telehealth: Payer: Self-pay | Admitting: *Deleted

## 2018-10-25 NOTE — Telephone Encounter (Signed)
LVM for patient to call back in regards to scheduling AWV with our health coach.  

## 2018-10-28 NOTE — Progress Notes (Signed)
Subjective:   Michael Lindsey is a 68 y.o. male who presents for Medicare Annual/Subsequent preventive examination.  Review of Systems:  No ROS.  Medicare Wellness Visit. Additional risk factors are reflected in the social history.  Cardiac Risk Factors include: advanced age (>37men, >70 women);male gender Sleep patterns: feels rested on waking, gets up 1-2 times nightly to void and sleeps 6-7 hours nightly.    Home Safety/Smoke Alarms: Feels safe in home. Smoke alarms in place.  Living environment; residence and Firearm Safety: 2-story house, no firearms. Lives with wife, no needs for DME, good support system Seat Belt Safety/Bike Helmet: Wears seat belt.   PSA-  Lab Results  Component Value Date   PSA 0.36 10/28/2017   PSA 0.52 10/20/2016   PSA 0.38 09/24/2015       Objective:    Vitals: BP 108/60   Pulse 61   Ht 6' (1.829 m)   Wt 173 lb (78.5 kg)   SpO2 98%   BMI 23.46 kg/m   Body mass index is 23.46 kg/m.  Advanced Directives 10/29/2018 05/13/2017 04/17/2017 10/08/2014  Does Patient Have a Medical Advance Directive? Yes No Yes No  Type of Paramedic of [place];Living will - [place];Living will -  Copy of [place] in Chart? No - copy requested - No - copy requested -  Would patient like information on creating a medical advance directive? - No - Patient declined - No - patient declined information    Tobacco Social History   Tobacco Use  Smoking Status Former Smoker  . Last attempt to quit: 10/06/1980  . Years since quitting: 38.0  Smokeless Tobacco Former Systems developer  . Quit date: 56  Tobacco Comment   intermittent smoker over 4 years in high school & again in 1983 before quitting, never > 2 mos @ a time,never > 1/2 ppd     Counseling given: Not Answered Comment: intermittent smoker over 4 years in high school & again in 1983 before quitting, never > 2 mos @ a time,never > 1/2 ppd  Past Medical  History:  Diagnosis Date  . Fracture    L arm @ 10; 3 ribs with shoulder separation & PTX 2003  . Gastritis 09/13/2007   EGD  . GERD (gastroesophageal reflux disease)   . Hepatitis A 1972   from exposure to septic tank which malfunctioned  . Internal hemorrhoids 09/13/2007   COLON  . Osteoporosis   . Paroxysmal atrial fibrillation (HCC)    chads2vasc score is 1   Past Surgical History:  Procedure Laterality Date  . cataract surgery  10/2014  . COLONOSCOPY  2011   Dr Sharlett Iles  . HERNIA REPAIR    . SKIN CANCER EXCISION  2011   R calf, Dr Jarome Matin  . UPPER GASTROINTESTINAL ENDOSCOPY  2003 & 2008    H pylori 2008   Family History  Problem Relation Age of Onset  . Depression Mother   . Tuberculosis Mother   . Coronary artery disease Father   . Emphysema Father   . Colon cancer Father 40  . Depression Brother   . Alcohol abuse Paternal Uncle   . Diabetes Neg Hx   . Stroke Neg Hx    Social History   Socioeconomic History  . Marital status: Married    Spouse name: Not on file  . Number of children: 2  . Years of education: 87  . Highest education level: Not on file  Occupational History  . Occupation: IMMIGRATION ATTORNEY  Social Needs  . Financial resource strain: Not hard at all  . Food insecurity:    Worry: Never true    Inability: Never true  . Transportation needs:    Medical: No    Non-medical: No  Tobacco Use  . Smoking status: Former Smoker    Last attempt to quit: 10/06/1980    Years since quitting: 38.0  . Smokeless tobacco: Former Systems developer    Quit date: 1982  . Tobacco comment: intermittent smoker over 4 years in high school & again in 1983 before quitting, never > 2 mos @ a time,never > 1/2 ppd  Substance and Sexual Activity  . Alcohol use: No    Comment: SOCIALLY,  . Drug use: No  . Sexual activity: Not Currently  Lifestyle  . Physical activity:    Days per week: 6 days    Minutes per session: 60 min  . Stress: To some extent  Relationships    . Social connections:    Talks on phone: More than three times a week    Gets together: More than three times a week    Attends religious service: 1 to 4 times per year    Active member of club or organization: Yes    Attends meetings of clubs or organizations: More than 4 times per year    Relationship status: Married  Other Topics Concern  . Not on file  Social History Narrative   GETS REG EXERCISE      Lives in [place]   Works as an Engineer, petroleum       Outpatient Encounter Medications as of 10/29/2018  Medication Sig  . acetaminophen (TYLENOL) 325 MG tablet Take 650 mg by mouth daily as needed for mild pain, moderate pain or headache (pain).   Marland Kitchen apixaban (ELIQUIS) 5 MG TABS tablet Take 1 tablet (5 mg total) by mouth 2 (two) times daily.  . Ascorbic Acid (VITAMIN C) 100 MG tablet Take 100 mg by mouth daily.  . cholecalciferol (VITAMIN D) 1000 UNITS tablet Take 1,000 Units by mouth daily.  . Flaxseed, Linseed, (FLAXSEED OIL) 1000 MG CAPS Take by mouth as directed.   . Glucosamine HCl (GLUCOSAMINE PO) Take 1 each by mouth 2 (two) times daily.  . lansoprazole (PREVACID) 15 MG capsule Take 15 mg by mouth as needed.  . metoprolol tartrate (LOPRESSOR) 25 MG tablet TAKE 1/2 TABLET(12.5 MG) BY MOUTH TWICE DAILY  . thiamine (VITAMIN B-1) 100 MG tablet Take 100 mg by mouth daily.  . vitamin E 100 UNIT capsule Take 100 Units by mouth daily.    No facility-administered encounter medications on file as of 10/29/2018.     Activities of Daily Living In your present state of health, do you have any difficulty performing the following activities: 10/29/2018  Hearing? N  Vision? N  Difficulty concentrating or making decisions? N  Walking or climbing stairs? N  Dressing or bathing? N  Doing errands, shopping? N  Preparing Food and eating ? N  Using the Toilet? N  In the past six months, have you accidently leaked urine? N  Do you have problems with loss of bowel control? N   Managing your Medications? N  Managing your Finances? N  Housekeeping or managing your Housekeeping? N  Some recent data might be hidden    Patient Care Team: Lance Sell, NP as PCP - General (Nurse Practitioner) Jettie Booze, MD as Consulting Physician (Cardiology)  Assessment:   This is a routine wellness examination for Michael Lindsey. Physical assessment deferred to PCP.  Exercise Activities and Dietary recommendations Current Exercise Habits: Home exercise routine;Structured exercise class, Type of exercise: strength training/weights;calisthenics(tennis, golf), Time (Minutes): 60, Frequency (Times/Week): 5, Weekly Exercise (Minutes/Week): 300, Intensity: Moderate  Diet (meal preparation, eat out, water intake, caffeinated beverages, dairy products, fruits and vegetables): in general, a "healthy" diet  , well balanced. eats a variety of fruits and vegetables daily, limits salt, fat/cholesterol, sugar,carbohydrates,caffeine, drinks 6-8 glasses of water daily.  Goals    . Patient Stated     Continue to stay active both physically and socially.       Fall Risk Fall Risk  10/29/2018 08/19/2017 10/20/2016 09/24/2015 08/12/2013  Falls in the past year? 0 Yes No No No  Number falls in past yr: 0 1 - - -  Injury with Fall? 0 Yes - - -  Comment - leg cramps/ cut on head - - -    Depression Screen PHQ 2/9 Scores 10/29/2018 08/19/2017 10/20/2016 09/24/2015  PHQ - 2 Score 0 0 0 0    Cognitive Function       Ad8 score reviewed for issues:  Issues making decisions: no  Less interest in hobbies / activities: no  Repeats questions, stories (family complaining): no  Trouble using ordinary gadgets (microwave, computer, phone):no  Forgets the month or year: no  Mismanaging finances: no  Remembering appts: no  Daily problems with thinking and/or memory: no Ad8 score is= 0  Immunization History  Administered Date(s) Administered  . Influenza Split 07/10/2011  .  Influenza Whole 07/06/2002, 08/06/2012, 06/20/2013  . Influenza, High Dose Seasonal PF 08/09/2017, 07/15/2018  . Influenza-Unspecified 08/05/2015  . Pneumococcal Conjugate-13 10/20/2016  . Pneumococcal Polysaccharide-23 10/28/2017  . Td 05/23/2002  . Tdap 08/12/2013, 10/08/2014  . Zoster 07/06/2012   Screening Tests Health Maintenance  Topic Date Due  . COLONOSCOPY  05/11/2023  . TETANUS/TDAP  10/08/2024  . INFLUENZA VACCINE  Completed  . Hepatitis C Screening  Completed  . PNA vac Low Risk Adult  Completed      Plan:    Bring a copy of your living will and/or healthcare power of attorney to your next office visit.  Continue to eat heart healthy diet (full of fruits, vegetables, whole grains, lean protein, water--limit salt, fat, and sugar intake) and increase physical activity as tolerated.  I have personally reviewed and noted the following in the patient's chart:   . Medical and social history . Use of alcohol, tobacco or illicit drugs  . Current medications and supplements . Functional ability and status . Nutritional status . Physical activity . Advanced directives . List of other physicians . Vitals . Screenings to include cognitive, depression, and falls . Referrals and appointments  In addition, I have reviewed and discussed with patient certain preventive protocols, quality metrics, and best practice recommendations. A written personalized care plan for preventive services as well as general preventive health recommendations were provided to patient.     Michiel Cowboy, RN  10/29/2018

## 2018-10-29 ENCOUNTER — Other Ambulatory Visit (INDEPENDENT_AMBULATORY_CARE_PROVIDER_SITE_OTHER): Payer: Medicare Other

## 2018-10-29 ENCOUNTER — Encounter: Payer: Self-pay | Admitting: Nurse Practitioner

## 2018-10-29 ENCOUNTER — Ambulatory Visit (INDEPENDENT_AMBULATORY_CARE_PROVIDER_SITE_OTHER): Payer: Medicare Other | Admitting: Nurse Practitioner

## 2018-10-29 ENCOUNTER — Ambulatory Visit (INDEPENDENT_AMBULATORY_CARE_PROVIDER_SITE_OTHER): Payer: Medicare Other | Admitting: *Deleted

## 2018-10-29 VITALS — BP 108/60 | HR 61 | Ht 72.0 in | Wt 173.0 lb

## 2018-10-29 DIAGNOSIS — I480 Paroxysmal atrial fibrillation: Secondary | ICD-10-CM

## 2018-10-29 DIAGNOSIS — Z125 Encounter for screening for malignant neoplasm of prostate: Secondary | ICD-10-CM | POA: Diagnosis not present

## 2018-10-29 DIAGNOSIS — Z0000 Encounter for general adult medical examination without abnormal findings: Secondary | ICD-10-CM

## 2018-10-29 DIAGNOSIS — Z13220 Encounter for screening for lipoid disorders: Secondary | ICD-10-CM

## 2018-10-29 DIAGNOSIS — M8589 Other specified disorders of bone density and structure, multiple sites: Secondary | ICD-10-CM

## 2018-10-29 LAB — COMPREHENSIVE METABOLIC PANEL
ALK PHOS: 60 U/L (ref 39–117)
ALT: 15 U/L (ref 0–53)
AST: 20 U/L (ref 0–37)
Albumin: 3.9 g/dL (ref 3.5–5.2)
BILIRUBIN TOTAL: 0.8 mg/dL (ref 0.2–1.2)
BUN: 10 mg/dL (ref 6–23)
CO2: 29 mEq/L (ref 19–32)
Calcium: 8.9 mg/dL (ref 8.4–10.5)
Chloride: 104 mEq/L (ref 96–112)
Creatinine, Ser: 0.84 mg/dL (ref 0.40–1.50)
GFR: 90.68 mL/min (ref >60.00)
Glucose, Bld: 88 mg/dL (ref 70–99)
Potassium: 4.4 mEq/L (ref 3.5–5.1)
Sodium: 138 mEq/L (ref 135–145)
Total Protein: 6.2 g/dL (ref 6.0–8.3)

## 2018-10-29 LAB — LIPID PANEL
Cholesterol: 153 mg/dL (ref 0–200)
HDL: 54.2 mg/dL (ref >39.00)
LDL Cholesterol: 85 mg/dL (ref 0–99)
NonHDL: 98.35
Total CHOL/HDL Ratio: 3
Triglycerides: 68 mg/dL (ref 0.0–149.0)
VLDL: 13.6 mg/dL (ref 0.0–40.0)

## 2018-10-29 LAB — CBC
HCT: 44.6 % (ref 39.0–52.0)
HEMOGLOBIN: 15.1 g/dL (ref 13.0–17.0)
MCHC: 33.9 g/dL (ref 30.0–36.0)
MCV: 95.3 fl (ref 78.0–100.0)
Platelets: 223 10*3/uL (ref 150.0–400.0)
RBC: 4.68 Mil/uL (ref 4.22–5.81)
RDW: 13.3 % (ref 11.5–15.5)
WBC: 5.7 10*3/uL (ref 4.0–10.5)

## 2018-10-29 LAB — TSH: TSH: 2 u[IU]/mL (ref 0.35–4.50)

## 2018-10-29 LAB — PSA, MEDICARE: PSA: 0.53 ng/mL (ref 0.10–4.00)

## 2018-10-29 NOTE — Assessment & Plan Note (Signed)
Update labs Continue current meds Continue regular follow up with cardiology - CBC; Future - Comprehensive metabolic panel; Future - TSH; Future

## 2018-10-29 NOTE — Progress Notes (Signed)
Medical screening examination/treatment/procedure(s) were performed by the Wellness Coach, RN. As primary care provider I was immediately available for consulation/collaboration. I agree with above documentation. Lenea Bywater, NP  

## 2018-10-29 NOTE — Assessment & Plan Note (Signed)
Continue calcium, vitamin d, weight bearing We discussed updating labs/dexa but he declines

## 2018-10-29 NOTE — Progress Notes (Signed)
Michael Lindsey is a 68 y.o. male with the following history as recorded in EpicCare:  Patient Active Problem List   Diagnosis Date Noted  . Bilateral primary osteoarthritis of knee 06/18/2018  . Leg pain 03/23/2018  . PAF (paroxysmal atrial fibrillation) ([place]) 11/17/2016  . Healthcare maintenance 10/20/2016  . Palpitations 09/24/2016  . Dyspnea 09/24/2016  . Eczema 11/03/2013  . Allergic rhinitis 06/01/2013  . Skin cancer 08/11/2012  . Dysphagia 12/08/2007  . Osteopenia 09/14/2007  . Esophageal reflux 04/13/2007    Current Outpatient Medications  Medication Sig Dispense Refill  . acetaminophen (TYLENOL) 325 MG tablet Take 650 mg by mouth daily as needed for mild pain, moderate pain or headache (pain).     Marland Kitchen apixaban (ELIQUIS) 5 MG TABS tablet Take 1 tablet (5 mg total) by mouth 2 (two) times daily. 60 tablet 11  . Ascorbic Acid (VITAMIN C) 100 MG tablet Take 100 mg by mouth daily.    . cholecalciferol (VITAMIN D) 1000 UNITS tablet Take 1,000 Units by mouth daily.    . Flaxseed, Linseed, (FLAXSEED OIL) 1000 MG CAPS Take by mouth as directed.     . Glucosamine HCl (GLUCOSAMINE PO) Take 1 each by mouth 2 (two) times daily.    . lansoprazole (PREVACID) 15 MG capsule Take 15 mg by mouth as needed.    . metoprolol tartrate (LOPRESSOR) 25 MG tablet TAKE 1/2 TABLET(12.5 MG) BY MOUTH TWICE DAILY 90 tablet 3  . thiamine (VITAMIN B-1) 100 MG tablet Take 100 mg by mouth daily.    . vitamin E 100 UNIT capsule Take 100 Units by mouth daily.      No current facility-administered medications for this visit.     Allergies: Chlorine  Past Medical History:  Diagnosis Date  . Fracture    L arm @ 10; 3 ribs with shoulder separation & PTX 2003  . Gastritis 09/13/2007   EGD  . GERD (gastroesophageal reflux disease)   . Hepatitis A 1972   from exposure to septic tank which malfunctioned  . Internal hemorrhoids 09/13/2007   COLON  . Osteoporosis   . Paroxysmal atrial fibrillation (HCC)    chads2vasc score is 1    Past Surgical History:  Procedure Laterality Date  . cataract surgery  10/2014  . COLONOSCOPY  2011   Dr Sharlett Iles  . HERNIA REPAIR    . SKIN CANCER EXCISION  2011   R calf, Dr Jarome Matin  . UPPER GASTROINTESTINAL ENDOSCOPY  2003 & 2008    H pylori 2008    Family History  Problem Relation Age of Onset  . Depression Mother   . Tuberculosis Mother   . Coronary artery disease Father   . Emphysema Father   . Colon cancer Father 27  . Depression Brother   . Alcohol abuse Paternal Uncle   . Diabetes Neg Hx   . Stroke Neg Hx     Social History   Tobacco Use  . Smoking status: Former Smoker    Last attempt to quit: 10/06/1980    Years since quitting: 38.0  . Smokeless tobacco: Former Systems developer    Quit date: 1982  . Tobacco comment: intermittent smoker over 4 years in high school & again in 1983 before quitting, never > 2 mos @ a time,never > 1/2 ppd  Substance Use Topics  . Alcohol use: No    Comment: SOCIALLY,     Subjective:  Mr Bunton is here for annual follow up/healthcare maintenance. Aside from primary care,  over the past year he has Continued regular follow up with cardiology for PAF. He feels well today without  complaints, does tell me he's had more episodes of afib this year, last saw cardiology 1 month ago and was started on eliquis with next follow up in 3-4 months. Colonoscopy, hep C screening and vaccinations are up to date. He has a history of osteoporosis/osteopenia years ago but says he takes ca, vitamin D supplements daily and regular weight bearing exercise. He tells me he tries to exercise regularly and follow a healthy diet  Review of Systems  Constitutional: Negative for chills and fever.  HENT: Negative for hearing loss.   Eyes: Negative for blurred vision and double vision.  Respiratory: Negative for cough and shortness of breath.   Cardiovascular: Positive for palpitations. Negative for chest pain.  Gastrointestinal: Negative for  abdominal pain, constipation, diarrhea, heartburn, nausea and vomiting.  Genitourinary: Negative for dysuria and hematuria.  Musculoskeletal: Negative for falls.  Skin: Negative for rash.  Neurological: Negative for sensory change, loss of consciousness, weakness and headaches.  Endo/Heme/Allergies: Does not bruise/bleed easily.  Psychiatric/Behavioral: Negative for depression and memory loss. The patient is not nervous/anxious.    Objective:  Vitals:   10/29/18 0800  BP: 108/60  Pulse: 61  SpO2: 98%  Weight: 173 lb (78.5 kg)  Height: 6' (1.829 m)    General: Well developed, well nourished, in no acute distress  Skin : Warm and dry.  Head: Normocephalic and atraumatic  Eyes: Sclera and conjunctiva clear; pupils round and reactive to light; extraocular movements intact  Ears: External normal; canals clear; tympanic membranes normal  Oropharynx: Pink, supple. No suspicious lesions  Neck: Supple without thyromegaly, adenopathy  Lungs: Respirations unlabored; clear to auscultation bilaterally without wheeze, rales, rhonchi  CVS exam: normal rate and regular rhythm, S1 and S2 normal.  Abdomen: Soft; nontender; nondistended; normoactive bowel sounds; no masses or hepatosplenomegaly  Musculoskeletal: No deformities; no active joint inflammation  Extremities: No edema, cyanosis, clubbing  Vessels: Symmetric bilaterally  Neurologic: Alert and oriented; speech intact; face symmetrical; moves all extremities well; CNII-XII intact without focal deficit  Psychiatric: Normal mood and affect.   Assessment:  1. Healthcare maintenance   2. PAF (paroxysmal atrial fibrillation) ([place])   3. Osteopenia of multiple sites   4. Screening for prostate cancer   5. Screening for cholesterol level     Plan:   Return in about 1 year (around 10/30/2019) for annual follow up.  Orders Placed This Encounter  Procedures  . CBC    Standing Status:   Future    Standing Expiration Date:   10/30/2019  .  Comprehensive metabolic panel    Standing Status:   Future    Standing Expiration Date:   10/30/2019  . Lipid panel    Standing Status:   Future    Standing Expiration Date:   10/30/2019  . PSA, Medicare    Standing Status:   Future    Standing Expiration Date:   10/30/2019  . TSH    Standing Status:   Future    Standing Expiration Date:   10/30/2019    Requested Prescriptions    No prescriptions requested or ordered in this encounter

## 2018-10-29 NOTE — Patient Instructions (Addendum)
Head downstairs for labs/xray  I will see you back in 1 year, sooner if needed   Health Maintenance After Age 69 After age 41, you are at a higher risk for certain long-term diseases and infections as well as injuries from falls. Falls are a major cause of broken bones and head injuries in people who are older than age 33. Getting regular preventive care can help to keep you healthy and well. Preventive care includes getting regular testing and making lifestyle changes as recommended by your health care provider. Talk with your health care provider about:  Which screenings and tests you should have. A screening is a test that checks for a disease when you have no symptoms.  A diet and exercise plan that is right for you. What should I know about screenings and tests to prevent falls? Screening and testing are the best ways to find a health problem early. Early diagnosis and treatment give you the best chance of managing medical conditions that are common after age 43. Certain conditions and lifestyle choices may make you more likely to have a fall. Your health care provider may recommend:  Regular vision checks. Poor vision and conditions such as cataracts can make you more likely to have a fall. If you wear glasses, make sure to get your prescription updated if your vision changes.  Medicine review. Work with your health care provider to regularly review all of the medicines you are taking, including over-the-counter medicines. Ask your health care provider about any side effects that may make you more likely to have a fall. Tell your health care provider if any medicines that you take make you feel dizzy or sleepy.  Osteoporosis screening. Osteoporosis is a condition that causes the bones to get weaker. This can make the bones weak and cause them to break more easily.  Blood pressure screening. Blood pressure changes and medicines to control blood pressure can make you feel dizzy.  Strength  and balance checks. Your health care provider may recommend certain tests to check your strength and balance while standing, walking, or changing positions.  Foot health exam. Foot pain and numbness, as well as not wearing proper footwear, can make you more likely to have a fall.  Depression screening. You may be more likely to have a fall if you have a fear of falling, feel emotionally low, or feel unable to do activities that you used to do.  Alcohol use screening. Using too much alcohol can affect your balance and may make you more likely to have a fall. What actions can I take to lower my risk of falls? General instructions  Talk with your health care provider about your risks for falling. Tell your health care provider if: ? You fall. Be sure to tell your health care provider about all falls, even ones that seem minor. ? You feel dizzy, sleepy, or off-balance.  Take over-the-counter and prescription medicines only as told by your health care provider. These include any supplements.  Eat a healthy diet and maintain a healthy weight. A healthy diet includes low-fat dairy products, low-fat (lean) meats, and fiber from whole grains, beans, and lots of fruits and vegetables. Home safety  Remove any tripping hazards, such as rugs, cords, and clutter.  Install safety equipment such as grab bars in bathrooms and safety rails on stairs.  Keep rooms and walkways well-lit. Activity   Follow a regular exercise program to stay fit. This will help you maintain your balance. Ask your  health care provider what types of exercise are appropriate for you.  If you need a cane or walker, use it as recommended by your health care provider.  Wear supportive shoes that have nonskid soles. Lifestyle  Do not drink alcohol if your health care provider tells you not to drink.  If you drink alcohol, limit how much you have: ? 0-1 drink a day for women. ? 0-2 drinks a day for men.  Be aware of how  much alcohol is in your drink. In the [place] one drink equals one typical bottle of beer (12 oz), one-half glass of wine (5 oz), or one shot of hard liquor (1 oz).  Do not use any products that contain nicotine or tobacco, such as cigarettes and e-cigarettes. If you need help quitting, ask your health care provider. Summary  Having a healthy lifestyle and getting preventive care can help to protect your health and wellness after age 75.  Screening and testing are the best way to find a health problem early and help you avoid having a fall. Early diagnosis and treatment give you the best chance for managing medical conditions that are more common for people who are older than age 33.  Falls are a major cause of broken bones and head injuries in people who are older than age 61. Take precautions to prevent a fall at home.  Work with your health care provider to learn what changes you can make to improve your health and wellness and to prevent falls. This information is not intended to replace advice given to you by your health care provider. Make sure you discuss any questions you have with your health care provider. Document Released: 08/05/2017 Document Revised: 08/05/2017 Document Reviewed: 08/05/2017 Elsevier Interactive Patient Education  2019 [employer].

## 2018-10-29 NOTE — Patient Instructions (Addendum)
Bring a copy of your living will and/or healthcare power of attorney to your next office visit.  Continue to eat heart healthy diet (full of fruits, vegetables, whole grains, lean protein, water--limit salt, fat, and sugar intake) and increase physical activity as tolerated.  Michael Lindsey , Thank you for taking time to come for your Medicare Wellness Visit. I appreciate your ongoing commitment to your health goals. Please review the following plan we discussed and let me know if I can assist you in the future.   These are the goals we discussed: Goals    . Patient Stated     Continue to stay active both physically and socially.       This is a list of the screening recommended for you and due dates:  Health Maintenance  Topic Date Due  . Colon Cancer Screening  05/11/2023  . Tetanus Vaccine  10/08/2024  . Flu Shot  Completed  .  Hepatitis C: One time screening is recommended by Center for Disease Control  (CDC) for  adults born from [birth year] through 1966.   Completed  . Pneumonia vaccines  Completed

## 2018-10-29 NOTE — Assessment & Plan Note (Signed)
Reviewed annual screening exams, healthy lifestyle,  additional information provided on AVS Seeing wellness nurse today as well - CBC; Future - Comprehensive metabolic panel; Future - Lipid panel; Future - PSA, Medicare; Future - TSH; Future  Screening for prostate cancer- PSA, Medicare; Future  Screening for cholesterol level-Unsure if fasting- Lipid panel; Future

## 2018-11-18 ENCOUNTER — Telehealth: Payer: Self-pay | Admitting: Interventional Cardiology

## 2018-11-18 NOTE — Telephone Encounter (Signed)
Patient c/o Palpitations:  High priority if patient c/o lightheadedness, shortness of breath, or chest pain  1) How long have you had palpitations/irregular HR/ Afib? 24 hrs  Are you having the symptoms now? Yes   2) Are you currently experiencing lightheadedness, SOB or CP? no  3) Do you have a history of afib (atrial fibrillation) or irregular heart rhythm? yes  4) Have you checked your BP or HR? (document readings if available): 135-140 yesterday afternoon and last night, this morning it was normal range 60-85  5) Are you experiencing any other symptoms? No   He was getting the recording from his watch.

## 2018-11-18 NOTE — Telephone Encounter (Addendum)
Returned call to patient. He states that he went to [place] last Thursday through Sunday. He states that he had increased wine consumption. 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 Yesterday his HR was elevated at 135-140 bpm on his watch. Patient has afib and has not missed any doses of his Eliquis. He normally takes metoprolol tartrate 12.5 mg BID. He states that he took an extra full tablet (25 mg total) last night and it settled an he was able to sleep. Patient denies having any SOB, lightheadedness, dizziness, chest pain, or any other Sx. He states that his HR was 60-85 bpm this AM and he took a full 25 mg tablet of his metoprolol. Patient does not have any BP readings and states that he feels fine today. Patient wanting to know if he should continue taking 25 mg BID or continue taking an extra 25 mg tablet PRN for elevated HRs. Made patient aware that I would forward to Dr. Irish Lack for review and recommendation. Instructed for patient to continue with PRN dose for now and let me know if his Sx change or worsen. Patient verbalized understanding and thanked me for the call.

## 2018-11-19 NOTE — Telephone Encounter (Signed)
I would try metoprolol 25 mg BID.  He can also take a prn 25 mg dose as needed.

## 2018-11-22 NOTE — Telephone Encounter (Signed)
Called and spoke to patient and made patient of recommendations below. Patient states that he has not had any recurrent episodes and states that he would like to continue taking metoprolol 12.5 mg BID with  25 mg prn. Instructed the patient to let us know if his Sx change or worsen.

## 2018-12-15 ENCOUNTER — Telehealth: Payer: Self-pay | Admitting: Interventional Cardiology

## 2018-12-15 NOTE — Telephone Encounter (Signed)
Patient is calling about the price for his ELIQUIS.  He stated the nurse advised him to give the office a call if it every got to the point were he was no longer able to afford it.

## 2018-12-16 NOTE — Telephone Encounter (Signed)
Called and spoke to patient. He states that his Eliquis costs him $200/mo. He uses [facility]. Made patient aware that I will have PA nurse review. Patient would like to be called back at [phone].

## 2018-12-17 NOTE — Telephone Encounter (Signed)
I called the pt to discuss and he stated that he was told by his pharmacy last year that Eliquis will cost him$48 a month but was advised after the new year that the cost is over $200 a month. I asked if he has a deductible to meet for this year and he stated that he does not know and he then apologized as he stated he had to take another call that was coming in for him. I quickly asked him to contact his insurance company to get details on his policy and to call me back. He stated that he would.

## 2018-12-22 ENCOUNTER — Telehealth: Payer: Self-pay

## 2018-12-22 NOTE — Telephone Encounter (Signed)
Appointment Cancelled due to Coronavirus:  Called patient in regards to 3 mo f/u appointment with Dr. Irish Lack on 12/27/18.   Patient has not been in contact with someone that was recently sick with fever/cough or confirmed to have the [place] virus.  Patient denies having [cough, fever (100.4 or greater)], and/or shortness of breath).  Patient denies having any chest pain, SOB, increasing edema, wt gain, or increase in abdominal girth, syncope, or any other Sx.  Patient was okay with cancelling appointment and has been made aware that they will be contacted in the near future to reschedule.   Patient understands to let us know if they develop any Sx before then.  No refills needed at this time.   Visitor policy has been reviewed with the patient.   Will route to "CV DIV C19 CANCEL" pool.

## 2018-12-23 NOTE — Telephone Encounter (Signed)
Patient stated that the cost of his Eliquis has decreased and that "he is not asking anymore questions". Patient has not additional needs at this time.

## 2018-12-27 ENCOUNTER — Ambulatory Visit: Payer: Medicare Other | Admitting: Interventional Cardiology

## 2019-01-04 ENCOUNTER — Telehealth (INDEPENDENT_AMBULATORY_CARE_PROVIDER_SITE_OTHER): Payer: Medicare Other | Admitting: Interventional Cardiology

## 2019-01-04 ENCOUNTER — Other Ambulatory Visit: Payer: Self-pay

## 2019-01-04 ENCOUNTER — Encounter: Payer: Self-pay | Admitting: Interventional Cardiology

## 2019-01-04 VITALS — HR 55 | Ht 72.0 in | Wt 170.0 lb

## 2019-01-04 DIAGNOSIS — Z7901 Long term (current) use of anticoagulants: Secondary | ICD-10-CM

## 2019-01-04 DIAGNOSIS — I480 Paroxysmal atrial fibrillation: Secondary | ICD-10-CM | POA: Diagnosis not present

## 2019-01-04 DIAGNOSIS — R002 Palpitations: Secondary | ICD-10-CM

## 2019-01-04 MED ORDER — APIXABAN 5 MG PO TABS: 5.0000 mg | ORAL_TABLET | Freq: Two times a day (BID) | ORAL | 1 refills | Status: DC

## 2019-01-04 MED ORDER — METOPROLOL TARTRATE 25 MG PO TABS: ORAL_TABLET | ORAL | 1 refills | Status: DC

## 2019-01-04 NOTE — Telephone Encounter (Signed)
VIDEO Visit scheduled with Dr. Irish Lack on 3/31

## 2019-01-04 NOTE — Progress Notes (Signed)
Virtual Visit via Video Note    Evaluation Performed:  Follow-up visit  This visit type was conducted due to national recommendations for restrictions regarding the COVID-19 Pandemic (e.g. social distancing).  This format is felt to be most appropriate for this patient at this time.  All issues noted in this document were discussed and addressed.  No physical exam was performed (except for noted visual exam findings with Video Visits).  Please refer to the patient's chart (MyChart message for video visits and phone note for telephone visits) for the patient's consent to telehealth for [facility].  Date:  01/04/2019   ID:  Michael Lindsey, DOB [birth year], MRN [id number]  Patient Location: Patient home  Provider location:   [facility]  PCP:  Lance Sell, NP  Cardiologist:  No primary care provider on file. Truth Barot/Allred Electrophysiologist:  None   Chief Complaint:  AFib  History of Present Illness:    Michael Lindsey is a 68 y.o. male who presents via audio/video conferencing for a telehealth visit today.    He had palpitations and was found to have PAF.  His wife was watching him for sleep apnea.She has not seen any. He does snore, worse with alcohol consumption.  His watch recorded some fast heart rate during sleep on one night.  Sleep study was done in 8/18.   Since the last visit, she has had several episodes of AFib.  He has had episodes with stress and when he takes his metoprolol later than usual.      The patient does not have symptoms concerning for COVID-19 infection (fever, chills, cough, or new shortness of breath).   Denies : Chest pain. Dizziness. Leg edema. Nitroglycerin use. Orthopnea.  Paroxysmal nocturnal dyspnea. Shortness of breath. Syncope.   A few days ago, he had some alcohol and played golf the next day.  He felt tired after golf and went to bed. He woke up with some AFib. He used some additional metoprolol.  HR was controlled  during the episode.  He took several doses of the half tab of metoprolol.   Alcohol has been a consistent trigger for AFib.  He can drink Becks nonalcoholic beer without problems.  He has lost some weight while staying home more, due to less junk in his diet.     Prior CV studies:   The following studies were reviewed today: Normal LVEF in 2017 by echo.    Past Medical History:  Diagnosis Date  . Fracture    L arm @ 10; 3 ribs with shoulder separation & PTX 2003  . Gastritis 09/13/2007   EGD  . GERD (gastroesophageal reflux disease)   . Hepatitis A 1972   from exposure to septic tank which malfunctioned  . Internal hemorrhoids 09/13/2007   COLON  . Osteoporosis   . Paroxysmal atrial fibrillation (HCC)    chads2vasc score is 1   Past Surgical History:  Procedure Laterality Date  . cataract surgery  10/2014  . COLONOSCOPY  2011   Dr Sharlett Iles  . HERNIA REPAIR    . SKIN CANCER EXCISION  2011   R calf, Dr Jarome Matin  . UPPER GASTROINTESTINAL ENDOSCOPY  2003 & 2008    H pylori 2008     Current Meds  Medication Sig  . acetaminophen (TYLENOL) 325 MG tablet Take 650 mg by mouth daily as needed for mild pain, moderate pain or headache (pain).   Marland Kitchen apixaban (ELIQUIS) 5 MG TABS tablet Take 1 tablet (  5 mg total) by mouth 2 (two) times daily.  . Ascorbic Acid (VITAMIN C) 100 MG tablet Take 100 mg by mouth daily.  . cholecalciferol (VITAMIN D) 1000 UNITS tablet Take 1,000 Units by mouth daily.  . Flaxseed, Linseed, (FLAXSEED OIL) 1000 MG CAPS Take by mouth as directed.   . Glucosamine HCl (GLUCOSAMINE PO) Take 1 each by mouth 2 (two) times daily.  . lansoprazole (PREVACID) 15 MG capsule Take 15 mg by mouth as needed.  . metoprolol tartrate (LOPRESSOR) 25 MG tablet TAKE 1/2 TABLET(12.5 MG) BY MOUTH TWICE DAILY  . thiamine (VITAMIN B-1) 100 MG tablet Take 100 mg by mouth daily.  . vitamin E 100 UNIT capsule Take 100 Units by mouth daily.   . [DISCONTINUED] apixaban (ELIQUIS) 5 MG TABS  tablet Take 1 tablet (5 mg total) by mouth 2 (two) times daily.  . [DISCONTINUED] metoprolol tartrate (LOPRESSOR) 25 MG tablet TAKE 1/2 TABLET(12.5 MG) BY MOUTH TWICE DAILY     Allergies:   Chlorine   Social History   Tobacco Use  . Smoking status: Former Smoker    Last attempt to quit: 10/06/1980    Years since quitting: 38.2  . Smokeless tobacco: Former Systems developer    Quit date: 1982  . Tobacco comment: intermittent smoker over 4 years in high school & again in 1983 before quitting, never > 2 mos @ a time,never > 1/2 ppd  Substance Use Topics  . Alcohol use: No    Comment: SOCIALLY,  . Drug use: No     Family Hx: The patient's family history includes Alcohol abuse in his paternal uncle; Colon cancer (age of onset: 50) in his father; Coronary artery disease in his father; Depression in his brother and mother; Emphysema in his father; Tuberculosis in his mother. There is no history of Diabetes or Stroke.  ROS:   Please see the history of present illness.    Palpitations, worse with alcohol. All other systems reviewed and are negative.   Labs/Other Tests and Data Reviewed:    Recent Labs: 10/29/2018: ALT 15; BUN 10; Creatinine, Ser 0.84; Hemoglobin 15.1; Platelets 223.0; Potassium 4.4; Sodium 138; TSH 2.00   Recent Lipid Panel Lab Results  Component Value Date/Time   CHOL 153 10/29/2018 08:46 AM   TRIG 68.0 10/29/2018 08:46 AM   HDL 54.20 10/29/2018 08:46 AM   CHOLHDL 3 10/29/2018 08:46 AM   LDLCALC 85 10/29/2018 08:46 AM    Wt Readings from Last 3 Encounters:  01/04/19 170 lb (77.1 kg)  10/29/18 173 lb (78.5 kg)  10/29/18 173 lb (78.5 kg)     Objective:    Vital Signs:  Pulse (!) 55   Ht 6' (1.829 m)   Wt 170 lb (77.1 kg)   BMI 23.06 kg/m    Well nourished, well developed male in no acute distress. He appears well.  No obvious shortness of breath.  Exam limited by video format.     ASSESSMENT & PLAN:    1.  PAF: Maintaining NSR for the most part.  Minimize  alcohol.  No indication for AAD or ablation at this time.  OK to use prn metioprolol.  He will let us know if sx of AFib become more lifestyle limiting.  2.  Anticoagulated:  No bleeding issues.  Continue Eliquis.   3.  He is losing some weight since staying more at home.  He is maintaining social distancing.   COVID-19 Education: The signs and symptoms of COVID-19 were discussed with the  patient and how to seek care for testing (follow up with PCP or arrange E-visit).  The importance of social distancing was discussed today.  Patient Risk:   After full review of this patient's clinical status, I feel that they are at least moderate risk at this time.  Time:   Today, I have spent 25 minutes with the patient with telehealth technology discussing AFib .     Medication Adjustments/Labs and Tests Ordered: Current medicines are reviewed at length with the patient today.  Concerns regarding medicines are outlined above.  Tests Ordered: No orders of the defined types were placed in this encounter.  Medication Changes: Meds ordered this encounter  Medications  . apixaban (ELIQUIS) 5 MG TABS tablet    Sig: Take 1 tablet (5 mg total) by mouth 2 (two) times daily.    Dispense:  180 tablet    Refill:  1  . metoprolol tartrate (LOPRESSOR) 25 MG tablet    Sig: TAKE 1/2 TABLET(12.5 MG) BY MOUTH TWICE DAILY    Dispense:  90 tablet    Refill:  1    Disposition:  Follow up in 6 month(s)  Signed, Larae Grooms, MD  01/04/2019 12:18 PM    [facility]

## 2019-01-04 NOTE — Patient Instructions (Signed)
Medication Instructions:  Your physician recommends that you continue on your current medications as directed. Please refer to the Current Medication list given to you today.  Refills have been sent to your preferred pharmacy  Lab work: None Ordered  If you have labs (blood work) drawn today and your tests are completely normal, you will receive your results only by: Marland Kitchen MyChart Message (if you have MyChart) OR . A paper copy in the mail If you have any lab test that is abnormal or we need to change your treatment, we will call you to review the results.  Testing/Procedures: None ordered  Follow-Up: At [facility], you and your health needs are our priority.  As part of our continuing mission to provide you with exceptional heart care, we have created designated Provider Care Teams.  These Care Teams include your primary Cardiologist (physician) and Advanced Practice Providers (APPs -  Physician Assistants and Nurse Practitioners) who all work together to provide you with the care you need, when you need it. . You will need a follow up appointment in 6 months.  Please call our office 2 months in advance to schedule this appointment.  You may see Casandra Doffing, MD or one of the following Advanced Practice Providers on your designated Care Team:   . Lyda Jester, PA-C . Dayna Dunn, PA-C . Ermalinda Barrios, PA-C  Any Other Special Instructions Will Be Listed Below (If Applicable).

## 2019-03-09 ENCOUNTER — Telehealth: Payer: Self-pay | Admitting: Interventional Cardiology

## 2019-03-09 NOTE — Telephone Encounter (Signed)
° ° °  Patient is in the process of completing life insurance forms, he would like to know if his afib is considered chronic

## 2019-03-09 NOTE — Telephone Encounter (Signed)
Called and made patient aware that he has paroxysmal atrial fibrillation.

## 2019-03-24 DIAGNOSIS — Z961 Presence of intraocular lens: Secondary | ICD-10-CM | POA: Diagnosis not present

## 2019-03-24 DIAGNOSIS — H26491 Other secondary cataract, right eye: Secondary | ICD-10-CM | POA: Diagnosis not present

## 2019-04-26 DIAGNOSIS — H26491 Other secondary cataract, right eye: Secondary | ICD-10-CM | POA: Diagnosis not present

## 2019-04-26 DIAGNOSIS — H2512 Age-related nuclear cataract, left eye: Secondary | ICD-10-CM | POA: Diagnosis not present

## 2019-04-26 DIAGNOSIS — H18413 Arcus senilis, bilateral: Secondary | ICD-10-CM | POA: Diagnosis not present

## 2019-04-26 DIAGNOSIS — Z961 Presence of intraocular lens: Secondary | ICD-10-CM | POA: Diagnosis not present

## 2019-05-03 ENCOUNTER — Other Ambulatory Visit: Payer: Self-pay

## 2019-05-03 ENCOUNTER — Ambulatory Visit (INDEPENDENT_AMBULATORY_CARE_PROVIDER_SITE_OTHER): Payer: Medicare Other

## 2019-05-03 ENCOUNTER — Ambulatory Visit (INDEPENDENT_AMBULATORY_CARE_PROVIDER_SITE_OTHER): Payer: Medicare Other | Admitting: Orthopaedic Surgery

## 2019-05-03 ENCOUNTER — Encounter: Payer: Self-pay | Admitting: Orthopaedic Surgery

## 2019-05-03 VITALS — BP 107/69 | HR 58 | Ht 72.0 in | Wt 170.0 lb

## 2019-05-03 DIAGNOSIS — M25562 Pain in left knee: Secondary | ICD-10-CM | POA: Diagnosis not present

## 2019-05-03 DIAGNOSIS — Z9841 Cataract extraction status, right eye: Secondary | ICD-10-CM | POA: Diagnosis not present

## 2019-05-03 DIAGNOSIS — Z961 Presence of intraocular lens: Secondary | ICD-10-CM | POA: Diagnosis not present

## 2019-05-03 NOTE — Progress Notes (Signed)
Office Visit Note   Patient: Michael Lindsey           Date of Birth: 01-30-1951           MRN: [id number] Visit Date: 05/03/2019              Requested by: Lance Sell, NP No address on file PCP: Lance Sell, NP   Assessment & Plan: Visit Diagnoses:  1. Acute pain of left knee     Plan: Acute onset of left knee pain playing tennis several weeks ago.  Feeling better.  Not much change in films from 3 years ago.  Has established diagnosis of osteoarthritis  and may have had an exacerbation.  Also a possibility of a tear of the medial meniscus.  Will order MRI scan of left knee  Follow-Up Instructions: Return if symptoms worsen or fail to improve.   Orders:  Orders Placed This Encounter  Procedures  . XR KNEE 3 VIEW LEFT   No orders of the defined types were placed in this encounter.     Procedures: No procedures performed   Clinical Data: No additional findings.   Subjective: Chief Complaint  Patient presents with  . Left Knee - Pain  Patient presents today for recurrent left knee pain. He was playing tennis on 04/30/2019 and while stepping in for the ball he felt a pop in his knee. He is able to flex and extend without difficulty, but has pain with side to side motion. No swelling. The pain is located medially. He is not taking anything for pain.  Pain is better over the last week but still having some pain along medial corner of his left knee.  Able to swim for exercise without any problems.  Has had a prior diagnosis of osteoarthritis with cortisone and Synvisc injections which have improved his pain.  No locking or giving way HPI  Review of Systems   Objective: Vital Signs: BP 107/69   Pulse (!) 58   Ht 6' (1.829 m)   Wt 170 lb (77.1 kg)   BMI 23.06 kg/m   Physical Exam Constitutional:      Appearance: He is well-developed.  Eyes:     Pupils: Pupils are equal, round, and reactive to light.  Pulmonary:     Effort: Pulmonary effort is  normal.  Skin:    General: Skin is warm and dry.  Neurological:     Mental Status: He is alert and oriented to person, place, and time.  Psychiatric:        Behavior: Behavior normal.     Ortho Exam left knee without effusion.  Has local tenderness along the posterior medial corner of his knee.  Full extension and flexion without instability.  No popping or clicking.  No popliteal pain.  No distal edema.  Neurologically intact.  Walks without a limp.  No use of ambulatory aid.  Straight leg raise negative.  Painless range of motion both hips  Specialty Comments:  No specialty comments available.  Imaging: Xr Knee 3 View Left  Result Date: 05/03/2019 Films of the left knee were obtained in several projections standing.  There is minimal decrease in the medial joint space and slightly more than in from the films in 2017.  No ectopic calcification.  No significant subchondral sclerosis.  No acute changes.  Mild to moderate degenerative changes about the patella lateral patella tilt    PMFS History: Patient Active Problem List   Diagnosis Date Noted  .  Pain in left knee 05/03/2019  . Bilateral primary osteoarthritis of knee 06/18/2018  . Leg pain 03/23/2018  . PAF (paroxysmal atrial fibrillation) ([place]) 11/17/2016  . Healthcare maintenance 10/20/2016  . Palpitations 09/24/2016  . Dyspnea 09/24/2016  . Eczema 11/03/2013  . Allergic rhinitis 06/01/2013  . Skin cancer 08/11/2012  . Dysphagia 12/08/2007  . Osteopenia 09/14/2007  . Esophageal reflux 04/13/2007   Past Medical History:  Diagnosis Date  . Fracture    L arm @ 10; 3 ribs with shoulder separation & PTX 2003  . Gastritis 09/13/2007   EGD  . GERD (gastroesophageal reflux disease)   . Hepatitis A 1972   from exposure to septic tank which malfunctioned  . Internal hemorrhoids 09/13/2007   COLON  . Osteoporosis   . Paroxysmal atrial fibrillation (HCC)    chads2vasc score is 1    Family History  Problem Relation Age  of Onset  . Depression Mother   . Tuberculosis Mother   . Coronary artery disease Father   . Emphysema Father   . Colon cancer Father 40  . Depression Brother   . Alcohol abuse Paternal Uncle   . Diabetes Neg Hx   . Stroke Neg Hx     Past Surgical History:  Procedure Laterality Date  . cataract surgery  10/2014  . COLONOSCOPY  2011   Dr Sharlett Iles  . HERNIA REPAIR    . SKIN CANCER EXCISION  2011   R calf, Dr Jarome Matin  . UPPER GASTROINTESTINAL ENDOSCOPY  2003 & 2008    H pylori 2008   Social History   Occupational History  . Occupation: IMMIGRATION ATTORNEY  Tobacco Use  . Smoking status: Former Smoker    Quit date: 10/06/1980    Years since quitting: 38.5  . Smokeless tobacco: Former Systems developer    Quit date: 1982  . Tobacco comment: intermittent smoker over 4 years in high school & again in 1983 before quitting, never > 2 mos @ a time,never > 1/2 ppd  Substance and Sexual Activity  . Alcohol use: No    Comment: SOCIALLY,  . Drug use: No  . Sexual activity: Not Currently

## 2019-05-03 NOTE — Addendum Note (Signed)
Addended by: Lendon Collar on: 05/03/2019 02:06 PM   Modules accepted: Orders

## 2019-05-23 ENCOUNTER — Telehealth: Payer: Self-pay | Admitting: Interventional Cardiology

## 2019-05-23 NOTE — Telephone Encounter (Signed)
OK to call in the 45 tabs.

## 2019-05-23 NOTE — Telephone Encounter (Signed)
Pt called back stating that he is running out of medication because occasionally he takes an whole tablet on days that his A-Fib occurs. Would Dr. Irish Lack like to increase the amount of tablets that is prescribed so pt will not run out? Could we prescribed Metoprolol 25 mg tablet taking 1/2 tablet BID, may take 1 tablet as needed for A-Fib? Dispensing 45 tablet for 30 day supply. Please address

## 2019-05-23 NOTE — Telephone Encounter (Signed)
Called pt and left message informing pt that we do not have samples of Metoprolol and that he could give our office a call back and I could send in a 10 day supply until he was able to get his refills.

## 2019-05-23 NOTE — Telephone Encounter (Signed)
  Patient calling the office for samples of medication:   1.  What medication and dosage are you requesting samples for? metoprolol tartrate (LOPRESSOR) 25 MG tablet  2.  Are you currently out of this medication? Patient ran out of pills early and needs about 8 pills to get him through till he can refill it.

## 2019-05-24 MED ORDER — METOPROLOL TARTRATE 25 MG PO TABS: ORAL_TABLET | ORAL | 1 refills | Status: DC

## 2019-05-24 NOTE — Addendum Note (Signed)
Addended by: Drue Novel I on: 05/24/2019 08:38 AM   Modules accepted: Orders

## 2019-05-24 NOTE — Telephone Encounter (Signed)
Called and spoke to patient regarding his metoprolol RX. Patient takes metoprolol 25 mg tablet: 1/2 tablet BID. Patient occasionally takes an extra 1/2 tablet for his Afib. Patient prefers 90 day supply. Rx sent for patient to "TAKE 1/2 TABLET(12.5 MG) BY MOUTH TWICE DAILY. MAY TAKE AN EXTRA 1/2 TABLET AS NEEDED FOR AFIB". Rx sent for 135 tabs (90 day supply) to preferred pharmacy.

## 2019-06-08 DIAGNOSIS — L814 Other melanin hyperpigmentation: Secondary | ICD-10-CM | POA: Diagnosis not present

## 2019-06-08 DIAGNOSIS — D1801 Hemangioma of skin and subcutaneous tissue: Secondary | ICD-10-CM | POA: Diagnosis not present

## 2019-06-08 DIAGNOSIS — L570 Actinic keratosis: Secondary | ICD-10-CM | POA: Diagnosis not present

## 2019-06-08 DIAGNOSIS — D2271 Melanocytic nevi of right lower limb, including hip: Secondary | ICD-10-CM | POA: Diagnosis not present

## 2019-06-08 DIAGNOSIS — L821 Other seborrheic keratosis: Secondary | ICD-10-CM | POA: Diagnosis not present

## 2019-06-08 DIAGNOSIS — Z85828 Personal history of other malignant neoplasm of skin: Secondary | ICD-10-CM | POA: Diagnosis not present

## 2019-06-12 DIAGNOSIS — Z23 Encounter for immunization: Secondary | ICD-10-CM | POA: Diagnosis not present

## 2019-06-15 ENCOUNTER — Ambulatory Visit
Admission: RE | Admit: 2019-06-15 | Discharge: 2019-06-15 | Disposition: A | Discharge status: Home / Self Care | Payer: Medicare Other | Source: Ambulatory Visit | Attending: Orthopaedic Surgery | Admitting: Orthopaedic Surgery

## 2019-06-15 DIAGNOSIS — M25562 Pain in left knee: Secondary | ICD-10-CM | POA: Diagnosis not present

## 2019-06-21 ENCOUNTER — Ambulatory Visit: Payer: Medicare Other | Admitting: Orthopaedic Surgery

## 2019-06-22 ENCOUNTER — Ambulatory Visit: Payer: Medicare Other | Admitting: Orthopaedic Surgery

## 2019-06-23 ENCOUNTER — Ambulatory Visit: Payer: Medicare Other | Admitting: Orthopaedic Surgery

## 2019-07-18 ENCOUNTER — Other Ambulatory Visit: Payer: Self-pay | Admitting: *Deleted

## 2019-07-18 MED ORDER — APIXABAN 5 MG PO TABS: 5.0000 mg | ORAL_TABLET | Freq: Two times a day (BID) | ORAL | 1 refills | Status: DC

## 2019-07-18 NOTE — Telephone Encounter (Signed)
Prescription refill request for Eliquis received.  Last office visit: Dr. Irish Lack (01-04-2019) Scr: 0.84 (10-29-2018) Age: 68 y.o. Weight: 77.1 kg   Prescription refill sent.

## 2019-08-01 NOTE — Progress Notes (Signed)
Virtual Visit via Video Note   This visit type was conducted due to national recommendations for restrictions regarding the COVID-19 Pandemic (e.g. social distancing) in an effort to limit this patient's exposure and mitigate transmission in our community.  Due to his co-morbid illnesses, this patient is at least at moderate risk for complications without adequate follow up.  This format is felt to be most appropriate for this patient at this time.  All issues noted in this document were discussed and addressed.  A limited physical exam was performed with this format.  Please refer to the patient's chart for his consent to telehealth for [facility].   Date:  08/02/2019   ID:  Concha Se, DOB Feb 26, 1951, MRN [id number]  Patient Location: Home Provider Location: Home  PCP:  Rachelle Hora, MD  Cardiologist:  No primary care provider on file. [place] Electrophysiologist:  None   Evaluation Performed:  Follow-Up Visit  Chief Complaint:  PAF  History of Present Illness:    Michael Lindsey is a 68 y.o. male with who presents via audio/video conferencing for a telehealth visit today.    He had palpitations and was found to have PAF.  Prior records show: "His wife was watching him for sleep apnea.She has not seen any. He does snore, worse with alcohol consumption.  His watch recorded some fast heart rate during sleep on one night.  Sleep study was done in 8/18.   He has had several episodes of AFib. He has had episodes with stress and when he takes his metoprolol later than usual.  Alcohol has been a consistent trigger for AFib.  He can drink Becks nonalcoholic beer without problems.  He has lost some weight while staying home more, due to less junk in his diet. "  Since the last visit, he has had two episodes of AFib.  One occurred when did not take his medicine on time.  The longer episode was three days.  For the most part, he has not been limited in terms of his  AFib.    Denies : Chest pain. Dizziness. Leg edema. Nitroglycerin use. Orthopnea. Palpitations. Paroxysmal nocturnal dyspnea. Shortness of breath. Syncope.   The patient does not have symptoms concerning for COVID-19 infection (fever, chills, cough, or new shortness of breath).    Past Medical History:  Diagnosis Date  . Fracture    L arm @ 10; 3 ribs with shoulder separation & PTX 2003  . Gastritis 09/13/2007   EGD  . GERD (gastroesophageal reflux disease)   . Hepatitis A 1972   from exposure to septic tank which malfunctioned  . Internal hemorrhoids 09/13/2007   COLON  . Osteoporosis   . Paroxysmal atrial fibrillation (HCC)    chads2vasc score is 1   Past Surgical History:  Procedure Laterality Date  . cataract surgery  10/2014  . COLONOSCOPY  2011   Dr Sharlett Iles  . HERNIA REPAIR    . SKIN CANCER EXCISION  2011   R calf, Dr Jarome Matin  . UPPER GASTROINTESTINAL ENDOSCOPY  2003 & 2008    H pylori 2008     Current Meds  Medication Sig  . acetaminophen (TYLENOL) 325 MG tablet Take 650 mg by mouth daily as needed for mild pain, moderate pain or headache (pain).   Marland Kitchen apixaban (ELIQUIS) 5 MG TABS tablet Take 1 tablet (5 mg total) by mouth 2 (two) times daily.  . Ascorbic Acid (VITAMIN C) 100 MG tablet Take 100 mg by mouth  daily.  . cholecalciferol (VITAMIN D) 1000 UNITS tablet Take 1,000 Units by mouth daily.  . Flaxseed, Linseed, (FLAXSEED OIL) 1000 MG CAPS Take by mouth as directed.   . metoprolol tartrate (LOPRESSOR) 25 MG tablet TAKE 1/2 TABLET(12.5 MG) BY MOUTH TWICE DAILY. MAY TAKE AN EXTRA 1/2 TABLET AS NEEDED FOR AFIB  . thiamine (VITAMIN B-1) 100 MG tablet Take 100 mg by mouth daily.  . vitamin E 100 UNIT capsule Take 100 Units by mouth daily.      Allergies:   Chlorine   Social History   Tobacco Use  . Smoking status: Former Smoker    Quit date: 10/06/1980    Years since quitting: 38.8  . Smokeless tobacco: Former Systems developer    Quit date: 1982  . Tobacco comment:  intermittent smoker over 4 years in high school & again in 1983 before quitting, never > 2 mos @ a time,never > 1/2 ppd  Substance Use Topics  . Alcohol use: No    Comment: SOCIALLY,  . Drug use: No     Family Hx: The patient's family history includes Alcohol abuse in his paternal uncle; Colon cancer (age of onset: 18) in his father; Coronary artery disease in his father; Depression in his brother and mother; Emphysema in his father; Tuberculosis in his mother. There is no history of Diabetes or Stroke.  ROS:   Please see the history of present illness.    Palpitations as noted above All other systems reviewed and are negative.   Prior CV studies:   The following studies were reviewed today:    Labs/Other Tests and Data Reviewed:    EKG:  An ECG dated 12/19 was personally reviewed today and demonstrated:  AFib, slow ventricular response  Recent Labs: 10/29/2018: ALT 15; BUN 10; Creatinine, Ser 0.84; Hemoglobin 15.1; Platelets 223.0; Potassium 4.4; Sodium 138; TSH 2.00   Recent Lipid Panel Lab Results  Component Value Date/Time   CHOL 153 10/29/2018 08:46 AM   TRIG 68.0 10/29/2018 08:46 AM   HDL 54.20 10/29/2018 08:46 AM   CHOLHDL 3 10/29/2018 08:46 AM   LDLCALC 85 10/29/2018 08:46 AM    Wt Readings from Last 3 Encounters:  08/02/19 170 lb (77.1 kg)  05/03/19 170 lb (77.1 kg)  01/04/19 170 lb (77.1 kg)     Objective:    Vital Signs:  Wt 170 lb (77.1 kg)   BMI 23.06 kg/m    VITAL SIGNS:  reviewed GEN:  no acute distress RESPIRATORY:  normal respiratory effort, symmetric expansion NEURO:  alert and oriented x 3, no obvious focal deficit PSYCH:  normal affect exam limited by phone format  ASSESSMENT & PLAN:    1. PAF: Maintaining NSR.  If episodes get more frequent, he can be referred back to EP.  2. COntinue regular exercise and healthy diet.  3. Anticoagulated: No bleeding issues.  Labs to be drawn with PMD in the near future.  4. LDL 85 in Jan 2020.   COVID-19 Education: The signs and symptoms of COVID-19 were discussed with the patient and how to seek care for testing (follow up with PCP or arrange E-visit).  The importance of social distancing was discussed today.  Time:   Today, I have spent 15 minutes with the patient with telehealth technology discussing the above problems.     Medication Adjustments/Labs and Tests Ordered: Current medicines are reviewed at length with the patient today.  Concerns regarding medicines are outlined above.   Tests Ordered: No orders of  the defined types were placed in this encounter.   Medication Changes: No orders of the defined types were placed in this encounter.   Follow Up:  Either In Person or Virtual in 8 month(s)  Signed, Larae Grooms, MD  08/02/2019 1:42 PM    [facility]

## 2019-08-02 ENCOUNTER — Encounter: Payer: Self-pay | Admitting: Interventional Cardiology

## 2019-08-02 ENCOUNTER — Other Ambulatory Visit: Payer: Self-pay

## 2019-08-02 ENCOUNTER — Telehealth (INDEPENDENT_AMBULATORY_CARE_PROVIDER_SITE_OTHER): Payer: Medicare Other | Admitting: Interventional Cardiology

## 2019-08-02 VITALS — Wt 170.0 lb

## 2019-08-02 DIAGNOSIS — Z7901 Long term (current) use of anticoagulants: Secondary | ICD-10-CM | POA: Diagnosis not present

## 2019-08-02 DIAGNOSIS — I480 Paroxysmal atrial fibrillation: Secondary | ICD-10-CM | POA: Diagnosis not present

## 2019-08-02 MED ORDER — SILDENAFIL CITRATE 20 MG PO TABS: ORAL_TABLET | ORAL | 3 refills | Status: DC

## 2019-08-02 NOTE — Patient Instructions (Addendum)
Medication Instructions:  Your physician has recommended you make the following change in your medication:   START: sildenafil 20 mg tablet: Take 3-5 tablets AS NEEDED 1 hour prior to sexual activity  *If you need a refill on your cardiac medications before your next appointment, please call your pharmacy*  Lab Work: None ordered  If you have labs (blood work) drawn today and your tests are completely normal, you will receive your results only by: Marland Kitchen MyChart Message (if you have MyChart) OR . A paper copy in the mail If you have any lab test that is abnormal or we need to change your treatment, we will call you to review the results.  Testing/Procedures: None ordered  Follow-Up: At [facility], you and your health needs are our priority.  As part of our continuing mission to provide you with exceptional heart care, we have created designated Provider Care Teams.  These Care Teams include your primary Cardiologist (physician) and Advanced Practice Providers (APPs -  Physician Assistants and Nurse Practitioners) who all work together to provide you with the care you need, when you need it.  Your next appointment:   June 2021  The format for your next appointment:   Either In Person or Virtual  Provider:   You may see Casandra Doffing, MD or one of the following Advanced Practice Providers on your designated Care Team:    Melina Copa, PA-C  Ermalinda Barrios, PA-C   Other Instructions

## 2019-08-03 NOTE — Progress Notes (Signed)
   Addendum:  Prescribed sildenafil, 60-100 mg for erectile dysfunction.  He has not used nitroglycerin.  Jettie Booze, MD

## 2019-08-08 ENCOUNTER — Other Ambulatory Visit: Payer: Self-pay

## 2019-08-08 DIAGNOSIS — Z20828 Contact with and (suspected) exposure to other viral communicable diseases: Secondary | ICD-10-CM | POA: Diagnosis not present

## 2019-08-08 DIAGNOSIS — Z20822 Contact with and (suspected) exposure to covid-19: Secondary | ICD-10-CM

## 2019-08-08 NOTE — Addendum Note (Signed)
Addended by: Terence Lux on: 08/08/2019 09:16 AM   Modules accepted: Orders

## 2019-08-10 LAB — NOVEL CORONAVIRUS, NAA: SARS-CoV-2, NAA: NOT DETECTED

## 2019-09-27 ENCOUNTER — Ambulatory Visit: Payer: Medicare Other | Attending: Internal Medicine

## 2019-09-27 DIAGNOSIS — Z20822 Contact with and (suspected) exposure to covid-19: Secondary | ICD-10-CM

## 2019-09-29 LAB — NOVEL CORONAVIRUS, NAA: SARS-CoV-2, NAA: NOT DETECTED

## 2019-10-28 ENCOUNTER — Ambulatory Visit: Payer: Medicare Other | Attending: Internal Medicine

## 2019-10-28 DIAGNOSIS — Z23 Encounter for immunization: Secondary | ICD-10-CM | POA: Insufficient documentation

## 2019-10-28 NOTE — Progress Notes (Signed)
   Covid-19 Vaccination Clinic  Name:  DONNY HUTLEY    MRN: [id number] DOB: [birth year]  10/28/2019  Mr. Kiesling was observed post Covid-19 immunization for 15 minutes without incidence. He was provided with Vaccine Information Sheet and instruction to access the V-Safe system.   Mr. Elbaz was instructed to call 911 with any severe reactions post vaccine: Marland Kitchen Difficulty breathing  . Swelling of your face and throat  . A fast heartbeat  . A bad rash all over your body  . Dizziness and weakness    Immunizations Administered    Name Date Dose VIS Date Route   Pfizer COVID-19 Vaccine 10/28/2019  3:18 PM 0.3 mL 09/16/2019 Intramuscular   Manufacturer: [place]   Lot: [id number]   [place]: [id number]

## 2019-11-01 ENCOUNTER — Ambulatory Visit (INDEPENDENT_AMBULATORY_CARE_PROVIDER_SITE_OTHER): Payer: Medicare Other | Admitting: Internal Medicine

## 2019-11-01 ENCOUNTER — Other Ambulatory Visit: Payer: Self-pay

## 2019-11-01 ENCOUNTER — Encounter: Payer: Self-pay | Admitting: Internal Medicine

## 2019-11-01 VITALS — BP 110/70 | HR 54 | Temp 97.8°F | Ht 72.0 in | Wt 167.0 lb

## 2019-11-01 DIAGNOSIS — I519 Heart disease, unspecified: Secondary | ICD-10-CM | POA: Diagnosis not present

## 2019-11-01 DIAGNOSIS — N320 Bladder-neck obstruction: Secondary | ICD-10-CM

## 2019-11-01 DIAGNOSIS — N529 Male erectile dysfunction, unspecified: Secondary | ICD-10-CM

## 2019-11-01 DIAGNOSIS — E538 Deficiency of other specified B group vitamins: Secondary | ICD-10-CM | POA: Diagnosis not present

## 2019-11-01 DIAGNOSIS — I480 Paroxysmal atrial fibrillation: Secondary | ICD-10-CM

## 2019-11-01 DIAGNOSIS — K219 Gastro-esophageal reflux disease without esophagitis: Secondary | ICD-10-CM | POA: Diagnosis not present

## 2019-11-01 DIAGNOSIS — E559 Vitamin D deficiency, unspecified: Secondary | ICD-10-CM

## 2019-11-01 DIAGNOSIS — E611 Iron deficiency: Secondary | ICD-10-CM | POA: Diagnosis not present

## 2019-11-01 DIAGNOSIS — Z7901 Long term (current) use of anticoagulants: Secondary | ICD-10-CM

## 2019-11-01 HISTORY — DX: Male erectile dysfunction, unspecified: N52.9

## 2019-11-01 LAB — HEPATIC FUNCTION PANEL
ALT: 15 U/L (ref 0–53)
AST: 20 U/L (ref 0–37)
Albumin: 3.8 g/dL (ref 3.5–5.2)
Alkaline Phosphatase: 59 U/L (ref 39–117)
Bilirubin, Direct: 0.1 mg/dL (ref 0.0–0.3)
Total Bilirubin: 0.7 mg/dL (ref 0.2–1.2)
Total Protein: 6 g/dL (ref 6.0–8.3)

## 2019-11-01 LAB — BASIC METABOLIC PANEL
BUN: 12 mg/dL (ref 6–23)
CO2: 29 mEq/L (ref 19–32)
Calcium: 8.8 mg/dL (ref 8.4–10.5)
Chloride: 105 mEq/L (ref 96–112)
Creatinine, Ser: 0.86 mg/dL (ref 0.40–1.50)
GFR: 87.99 mL/min (ref >60.00)
Glucose, Bld: 72 mg/dL (ref 70–99)
Potassium: 4 mEq/L (ref 3.5–5.1)
Sodium: 140 mEq/L (ref 135–145)

## 2019-11-01 LAB — LIPID PANEL
Cholesterol: 153 mg/dL (ref 0–200)
HDL: 54.5 mg/dL (ref >39.00)
LDL Cholesterol: 82 mg/dL (ref 0–99)
NonHDL: 98.96
Total CHOL/HDL Ratio: 3
Triglycerides: 85 mg/dL (ref 0.0–149.0)
VLDL: 17 mg/dL (ref 0.0–40.0)

## 2019-11-01 LAB — CBC WITH DIFFERENTIAL/PLATELET
Basophils Absolute: 0 10*3/uL (ref 0.0–0.1)
Basophils Relative: 0.6 % (ref 0.0–3.0)
Eosinophils Absolute: 0.3 10*3/uL (ref 0.0–0.7)
Eosinophils Relative: 5.1 % — ABNORMAL HIGH (ref 0.0–5.0)
HCT: 43.5 % (ref 39.0–52.0)
Hemoglobin: 15 g/dL (ref 13.0–17.0)
Lymphocytes Relative: 15.3 % (ref 12.0–46.0)
Lymphs Abs: 0.9 10*3/uL (ref 0.7–4.0)
MCHC: 34.5 g/dL (ref 30.0–36.0)
MCV: 97 fl (ref 78.0–100.0)
Monocytes Absolute: 0.6 10*3/uL (ref 0.1–1.0)
Monocytes Relative: 9.3 % (ref 3.0–12.0)
Neutro Abs: 4.1 10*3/uL (ref 1.4–7.7)
Neutrophils Relative %: 69.7 % (ref 43.0–77.0)
Platelets: 189 10*3/uL (ref 150.0–400.0)
RBC: 4.49 Mil/uL (ref 4.22–5.81)
RDW: 13.2 % (ref 11.5–15.5)
WBC: 5.9 10*3/uL (ref 4.0–10.5)

## 2019-11-01 LAB — IBC PANEL
Iron: 96 ug/dL (ref 42–165)
Saturation Ratios: 36.5 % (ref 20.0–50.0)
Transferrin: 188 mg/dL — ABNORMAL LOW (ref 212.0–360.0)

## 2019-11-01 LAB — URINALYSIS, ROUTINE W REFLEX MICROSCOPIC
Bilirubin Urine: NEGATIVE
Hgb urine dipstick: NEGATIVE
Ketones, ur: NEGATIVE
Leukocytes,Ua: NEGATIVE
Nitrite: NEGATIVE
RBC / HPF: NONE SEEN (ref 0–?)
Specific Gravity, Urine: 1.015 (ref 1.000–1.030)
Total Protein, Urine: NEGATIVE
Urine Glucose: NEGATIVE
Urobilinogen, UA: 0.2 (ref 0.0–1.0)
WBC, UA: NONE SEEN (ref 0–?)
pH: 7 (ref 5.0–8.0)

## 2019-11-01 LAB — VITAMIN B12: Vitamin B-12: 500 pg/mL (ref 211–911)

## 2019-11-01 LAB — TSH: TSH: 1.76 u[IU]/mL (ref 0.35–4.50)

## 2019-11-01 LAB — VITAMIN D 25 HYDROXY (VIT D DEFICIENCY, FRACTURES): VITD: 37.27 ng/mL (ref 30.00–100.00)

## 2019-11-01 LAB — PSA: PSA: 0.44 ng/mL (ref 0.10–4.00)

## 2019-11-01 NOTE — Assessment & Plan Note (Signed)
stable overall by history and exam, recent data reviewed with pt, and pt to continue medical treatment as before,  to f/u any worsening symptoms or concerns  

## 2019-11-01 NOTE — Progress Notes (Signed)
Subjective:    Patient ID: Michael Lindsey, male    DOB: 03-21-1951, 69 y.o.   MRN: [id number]  HPI  Here for yearly f/u;  Overall doing ok;  Pt denies Chest pain, worsening SOB, DOE, wheezing, orthopnea, PND, worsening LE edema, palpitations, dizziness or syncope.  Pt denies neurological change such as new headache, facial or extremity weakness.  Pt denies polydipsia, polyuria, or low sugar symptoms. Pt states overall good compliance with treatment and medications, good tolerability, and has been trying to follow appropriate diet.  Pt denies worsening depressive symptoms, suicidal ideation or panic. No fever, night sweats, wt loss, loss of appetite, or other constitutional symptoms.  Pt states good ability with ADL's, has low fall risk, home safety reviewed and adequate, no other significant changes in hearing or vision, and very active with exercise for age - did swimming 2000 yds in 45 min this am Covid vaccine #1 done recently. No ETOH or caffeine as seems to trigger the afib.  Denies worsening reflux, abd pain, dysphagia, n/v, bowel change or blood. Past Medical History:  Diagnosis Date  . Erectile dysfunction 11/01/2019  . Fracture    L arm @ 10; 3 ribs with shoulder separation & PTX 2003  . Gastritis 09/13/2007   EGD  . GERD (gastroesophageal reflux disease)   . Hepatitis A 1972   from exposure to septic tank which malfunctioned  . Internal hemorrhoids 09/13/2007   COLON  . Osteoporosis   . Paroxysmal atrial fibrillation (HCC)    chads2vasc score is 1   Past Surgical History:  Procedure Laterality Date  . cataract surgery  10/2014  . COLONOSCOPY  2011   Dr Sharlett Iles  . HERNIA REPAIR    . SKIN CANCER EXCISION  2011   R calf, Dr Jarome Matin  . UPPER GASTROINTESTINAL ENDOSCOPY  2003 & 2008    H pylori 2008    reports that he quit smoking about 39 years ago. He quit smokeless tobacco use about 39 years ago. He reports that he does not drink alcohol or use drugs. family history  includes Alcohol abuse in his paternal uncle; Colon cancer (age of onset: 89) in his father; Coronary artery disease in his father; Depression in his brother and mother; Emphysema in his father; Tuberculosis in his mother. Allergies  Allergen Reactions  . Chlorine     EYES WATER/NASAL CONGESTION   Current Outpatient Medications on File Prior to Visit  Medication Sig Dispense Refill  . acetaminophen (TYLENOL) 325 MG tablet Take 650 mg by mouth daily as needed for mild pain, moderate pain or headache (pain).     Marland Kitchen apixaban (ELIQUIS) 5 MG TABS tablet Take 1 tablet (5 mg total) by mouth 2 (two) times daily. 180 tablet 1  . Ascorbic Acid (VITAMIN C) 100 MG tablet Take 100 mg by mouth daily.    . cholecalciferol (VITAMIN D) 1000 UNITS tablet Take 1,000 Units by mouth daily.    . Flaxseed, Linseed, (FLAXSEED OIL) 1000 MG CAPS Take by mouth as directed.     . metoprolol tartrate (LOPRESSOR) 25 MG tablet TAKE 1/2 TABLET(12.5 MG) BY MOUTH TWICE DAILY. MAY TAKE AN EXTRA 1/2 TABLET AS NEEDED FOR AFIB 135 tablet 1  . sildenafil (REVATIO) 20 MG tablet Take 3-5 tablets 1 hour prior to sexual activity 30 tablet 3  . thiamine (VITAMIN B-1) 100 MG tablet Take 100 mg by mouth daily.    . vitamin E 100 UNIT capsule Take 100 Units by mouth daily.  No current facility-administered medications on file prior to visit.   Review of Systems All otherwise neg per pt     Objective:   Physical Exam BP 110/70 (BP Location: Left Arm, Patient Position: Sitting, Cuff Size: Normal)   Pulse (!) 54   Temp 97.8 F (36.6 C) (Oral)   Ht 6' (1.829 m)   Wt 167 lb (75.8 kg)   SpO2 98%   BMI 22.65 kg/m  VS noted,  Constitutional: Pt appears in NAD HENT: Head: NCAT.  Right Ear: External ear normal.  Left Ear: External ear normal.  Eyes: . Pupils are equal, round, and reactive to light. Conjunctivae and EOM are normal Nose: without d/c or deformity Neck: Neck supple. Gross normal ROM Cardiovascular: Normal rate and  regular rhythm.   Pulmonary/Chest: Effort normal and breath sounds without rales or wheezing.  Abd:  Soft, NT, ND, + BS, no organomegaly Neurological: Pt is alert. At baseline orientation, motor grossly intact Skin: Skin is warm. No rashes, other new lesions, no LE edema Psychiatric: Pt behavior is normal without agitation  All otherwise neg per pt Lab Results  Component Value Date   WBC 5.7 10/29/2018   HGB 15.1 10/29/2018   HCT 44.6 10/29/2018   PLT 223.0 10/29/2018   GLUCOSE 88 10/29/2018   CHOL 153 10/29/2018   TRIG 68.0 10/29/2018   HDL 54.20 10/29/2018   LDLCALC 85 10/29/2018   ALT 15 10/29/2018   AST 20 10/29/2018   NA 138 10/29/2018   K 4.4 10/29/2018   CL 104 10/29/2018   CREATININE 0.84 10/29/2018   BUN 10 10/29/2018   CO2 29 10/29/2018   TSH 2.00 10/29/2018   PSA 0.53 10/29/2018      Assessment & Plan:

## 2019-11-01 NOTE — Assessment & Plan Note (Addendum)
stable overall by history and exam, recent data reviewed with pt, and pt to continue medical treatment as before,  to f/u any worsening symptoms or concerns'  I spent 31 minutes preparing to see the patient by review of recent labs, imaging and procedures, obtaining and reviewing separately obtained history, communicating with the patient and family or caregiver, ordering medications, tests or procedures, and documenting clinical information in the EHR including the differential Dx, treatment, and any further evaluation and other management of PAF, chronic anticoagulation, GERD

## 2019-11-01 NOTE — Patient Instructions (Signed)

## 2019-11-04 ENCOUNTER — Ambulatory Visit: Payer: Medicare Other

## 2019-11-04 ENCOUNTER — Encounter: Payer: Medicare Other | Admitting: Nurse Practitioner

## 2019-11-10 ENCOUNTER — Ambulatory Visit: Payer: Medicare Other

## 2019-11-18 ENCOUNTER — Ambulatory Visit: Payer: Medicare Other | Attending: Internal Medicine

## 2019-11-18 DIAGNOSIS — Z23 Encounter for immunization: Secondary | ICD-10-CM | POA: Insufficient documentation

## 2019-11-18 NOTE — Progress Notes (Signed)
   Covid-19 Vaccination Clinic  Name:  Michael Lindsey    MRN: [id number] DOB: 01-Jun-1951  11/18/2019  [name] was observed post Covid-19 immunization for 15 minutes without incidence. He was provided with Vaccine Information Sheet and instruction to access the V-Safe system.   [name] was instructed to call 911 with any severe reactions post vaccine: Marland Kitchen Difficulty breathing  . Swelling of your face and throat  . A fast heartbeat  . A bad rash all over your body  . Dizziness and weakness    Immunizations Administered    Name Date Dose VIS Date Route   Pfizer COVID-19 Vaccine 11/18/2019 11:32 AM 0.3 mL 09/16/2019 Intramuscular   Manufacturer: [place]   Lot: [id number]   [place]: [id number]

## 2019-12-15 ENCOUNTER — Other Ambulatory Visit: Payer: Self-pay

## 2019-12-15 DIAGNOSIS — Z961 Presence of intraocular lens: Secondary | ICD-10-CM | POA: Diagnosis not present

## 2019-12-15 DIAGNOSIS — H524 Presbyopia: Secondary | ICD-10-CM | POA: Diagnosis not present

## 2019-12-15 DIAGNOSIS — H2512 Age-related nuclear cataract, left eye: Secondary | ICD-10-CM | POA: Diagnosis not present

## 2019-12-15 DIAGNOSIS — Z9841 Cataract extraction status, right eye: Secondary | ICD-10-CM | POA: Diagnosis not present

## 2019-12-15 DIAGNOSIS — H52222 Regular astigmatism, left eye: Secondary | ICD-10-CM | POA: Diagnosis not present

## 2019-12-15 DIAGNOSIS — H5203 Hypermetropia, bilateral: Secondary | ICD-10-CM | POA: Diagnosis not present

## 2019-12-15 MED ORDER — METOPROLOL TARTRATE 25 MG PO TABS: ORAL_TABLET | ORAL | 1 refills | Status: DC

## 2019-12-20 ENCOUNTER — Ambulatory Visit: Payer: Medicare Other

## 2019-12-26 ENCOUNTER — Other Ambulatory Visit: Payer: Self-pay | Admitting: *Deleted

## 2019-12-26 MED ORDER — APIXABAN 5 MG PO TABS: 5.0000 mg | ORAL_TABLET | Freq: Two times a day (BID) | ORAL | 1 refills | Status: DC

## 2019-12-26 NOTE — Telephone Encounter (Signed)
Eliquis 5mg  paper refill request received from [facility] in [place], pt is 69yrs old, weight-75.8kg, Crea-0.86 on 11/01/2019, Diagnosis-Afib, and last seen by Dr. Irish Lack via Telemedicine on 08/02/2019. Dose is appropriate based on dosing criteria. Will send in refill to requested pharmacy; confirmed address with paper refill before sending electronically.

## 2020-02-03 ENCOUNTER — Telehealth: Payer: Self-pay | Admitting: Interventional Cardiology

## 2020-02-03 NOTE — Telephone Encounter (Signed)
Called and spoke to patient. He states that he has had more frequent episodes of his heart racing. He takes metoprolol 12.5 mg BID. He states that Tuesday night he took 50 mg instead of his 12.5 mg scheduled dose and again Wednesday morning. He states that when he just takes the extra 12.5 mg in addition to his scheduled 12.5 mg dose or even an additional 25 mg it does not help. He went back to his regular 12.5 mg dose Wednesday night and yesterday morning he had to take 50 mg again. He denies other Sx other than feeling his heart racing. He states that he is not sure how high it gets but the highest that he saw was 122 bpm. Patient's resting HR is around 54 bpm and BP around 110/70.   Patient was scheduled with Richardson Dopp, PA on 02/07/20 by the telephone operator. He states that he wanted to ask Dr. Irish Lack if he thought that he was at the point where we would need to consider an ablation. I told him that he may not need his appointment with Nicki Reaper on 5/4 if he is considering an ablation we could refer him to EP since his episodes have become more frequent. He would like Dr. Hassell Done opinion. Made patient aware that I would forward to Dr. Irish Lack for review.

## 2020-02-03 NOTE — Telephone Encounter (Signed)
Pt c/o medication issue:  1. Name of Medication: metoprolol tartrate (LOPRESSOR) 25 MG tablet  2. How are you currently taking this medication (dosage and times per day)? Normally as directed but took one tablet Tuesday PM and one tablet Wednesday AM and another one yesterday.   3. Are you having a reaction (difficulty breathing--STAT)? no  4. What is your medication issue? Patient wants to make sure he is taking this medication correctly. He states that he is supposed to take 1/2 tablet 2x daily but started taking a whole tablet at a time because of his afib. Scheduled him an appt with Richardson Dopp on 02/07/20 at 8:45am for his afib.

## 2020-02-06 NOTE — Telephone Encounter (Signed)
I spoke with patient and gave him information from Dr Irish Lack. Patient called earlier today and canceled appointment with Richardson Dopp, PA for May 4 and scheduled appointment with Dr Irish Lack on May 20.  Patient reports he had stomach bug last Monday and got over tired. He rested over the weekend and is feeling better.  He would like to keep scheduled office visit on May 20. He will discuss possible refer to EP with Dr Irish Lack at this visit.

## 2020-02-06 NOTE — Progress Notes (Deleted)
Cardiology Office Note:    Date:  02/06/2020   ID:  [name], DOB September 25, 1951, MRN [id number]  PCP:  Biagio Borg, MD  Cardiologist:  No primary care provider on file. *** Electrophysiologist:  None   Referring MD: Biagio Borg, MD   Chief Complaint:  No chief complaint on file.    Patient Profile:    Michael Lindsey is a 69 y.o. male with:   Paroxysmal atrial fibrillation   CHA2DS2-VASc=1 (age)  Placed on Apixaban in 09/2018 due to more frequent episodes of AFib   Prior CV studies: Echocardiogram 09/26/16 EF 60-65, no RWMA, trivial TR  Event Monitor 09/2016  Normal sinus rhythm with average heart rate 78bpm.  Paroxysmal atrial fibrillation rate controlled    History of Present Illness:    Michael Lindsey was last seen in 07/2019.  Reviewed prior notes.  Plan is to refer back to EP if he starts to have more frequent episodes of AF.  He called in recently with symptoms of recurrent atrial fibrillation and was scheduled for evaluation.    The DICTATELATER SmartLink is not supported in this context. ***   Past Medical History:  Diagnosis Date  . Erectile dysfunction 11/01/2019  . Fracture    L arm @ 10; 3 ribs with shoulder separation & PTX 2003  . Gastritis 09/13/2007   EGD  . GERD (gastroesophageal reflux disease)   . Hepatitis A 1972   from exposure to septic tank which malfunctioned  . Internal hemorrhoids 09/13/2007   COLON  . Osteoporosis   . Paroxysmal atrial fibrillation (HCC)    chads2vasc score is 1    Current Medications: No outpatient medications have been marked as taking for the 02/07/20 encounter (Appointment) with Richardson Dopp T, PA-C.     Allergies:   Chlorine   Social History   Tobacco Use  . Smoking status: Former Smoker    Quit date: 10/06/1980    Years since quitting: 39.3  . Smokeless tobacco: Former Systems developer    Quit date: 1982  . Tobacco comment: intermittent smoker over 4 years in high school & again in 1983 before quitting,  never > 2 mos @ a time,never > 1/2 ppd  Substance Use Topics  . Alcohol use: No    Comment: SOCIALLY,  . Drug use: No     Family Hx: The patient's family history includes Alcohol abuse in his paternal uncle; Colon cancer (age of onset: 59) in his father; Coronary artery disease in his father; Depression in his brother and mother; Emphysema in his father; Tuberculosis in his mother. There is no history of Diabetes or Stroke.  ROS   EKGs/Labs/Other Test Reviewed:    EKG:  EKG is *** ordered today.  The ekg ordered today demonstrates ***  Recent Labs: 11/01/2019: ALT 15; BUN 12; Creatinine, Ser 0.86; Hemoglobin 15.0; Platelets 189.0; Potassium 4.0; Sodium 140; TSH 1.76   Recent Lipid Panel Lab Results  Component Value Date/Time   CHOL 153 11/01/2019 08:46 AM   TRIG 85.0 11/01/2019 08:46 AM   HDL 54.50 11/01/2019 08:46 AM   CHOLHDL 3 11/01/2019 08:46 AM   LDLCALC 82 11/01/2019 08:46 AM    Physical Exam:    VS:  There were no vitals taken for this visit.    Wt Readings from Last 3 Encounters:  11/01/19 167 lb (75.8 kg)  08/02/19 170 lb (77.1 kg)  05/03/19 170 lb (77.1 kg)     Physical Exam ***  ASSESSMENT & PLAN:    ***  Dispo:  No follow-ups on file.   Medication Adjustments/Labs and Tests Ordered: Current medicines are reviewed at length with the patient today.  Concerns regarding medicines are outlined above.  Tests Ordered: No orders of the defined types were placed in this encounter.  Medication Changes: No orders of the defined types were placed in this encounter.   Signed, Richardson Dopp, PA-C  02/06/2020 8:44 AM    [facility] [place], [place], [place]  [address] Phone: [phone]; Fax: [phone]

## 2020-02-06 NOTE — Telephone Encounter (Signed)
Yes.  WOuld refer to EP (Dr. Rayann Heman or [facility]) for consideration of AFib ablation.   JV

## 2020-02-07 ENCOUNTER — Ambulatory Visit: Payer: Medicare Other | Admitting: Physician Assistant

## 2020-02-15 ENCOUNTER — Other Ambulatory Visit: Payer: Self-pay

## 2020-02-15 ENCOUNTER — Ambulatory Visit (INDEPENDENT_AMBULATORY_CARE_PROVIDER_SITE_OTHER): Payer: Medicare Other | Admitting: Internal Medicine

## 2020-02-15 ENCOUNTER — Encounter: Payer: Self-pay | Admitting: Internal Medicine

## 2020-02-15 VITALS — BP 120/68 | HR 60 | Temp 98.4°F | Ht 72.0 in | Wt 167.0 lb

## 2020-02-15 DIAGNOSIS — K219 Gastro-esophageal reflux disease without esophagitis: Secondary | ICD-10-CM | POA: Diagnosis not present

## 2020-02-15 DIAGNOSIS — R070 Pain in throat: Secondary | ICD-10-CM | POA: Diagnosis not present

## 2020-02-15 DIAGNOSIS — J309 Allergic rhinitis, unspecified: Secondary | ICD-10-CM

## 2020-02-15 MED ORDER — MELOXICAM 7.5 MG PO TABS: 7.5000 mg | ORAL_TABLET | Freq: Every day | ORAL | 2 refills | Status: DC | PRN

## 2020-02-15 NOTE — Assessment & Plan Note (Signed)
stable overall by history and exam, recent data reviewed with pt, and pt to continue medical treatment as before,  to f/u any worsening symptoms or concerns  

## 2020-02-15 NOTE — Progress Notes (Signed)
Subjective:    Patient ID: Michael Lindsey, male    DOB: 14-Oct-1950, 69 y.o.   MRN: [id number]  HPI  Here to c/o recent uri symptoms now resolved about 1 ago but stil lots of clearing the throat and tender left aspect of the adams apple for unclear reason; no fever, ha, ST, and Pt denies chest pain, increased sob or doe, wheezing, orthopnea, PND, increased LE swelling, palpitations, dizziness or syncope.  Pt denies new neurological symptoms such as new headache, or facial or extremity weakness or numbness   Pt denies polydipsia, polyuria,  No worsesning allergies though has some mild symptoms, nor worsening reflux. Denies worsening reflux, abd pain, dysphagia, n/v, bowel change or blood. Past Medical History:  Diagnosis Date  . Erectile dysfunction 11/01/2019  . Fracture    L arm @ 10; 3 ribs with shoulder separation & PTX 2003  . Gastritis 09/13/2007   EGD  . GERD (gastroesophageal reflux disease)   . Hepatitis A 1972   from exposure to septic tank which malfunctioned  . Internal hemorrhoids 09/13/2007   COLON  . Osteoporosis   . Paroxysmal atrial fibrillation (HCC)    chads2vasc score is 1   Past Surgical History:  Procedure Laterality Date  . cataract surgery  10/2014  . COLONOSCOPY  2011   Dr Sharlett Iles  . HERNIA REPAIR    . SKIN CANCER EXCISION  2011   R calf, Dr Jarome Matin  . UPPER GASTROINTESTINAL ENDOSCOPY  2003 & 2008    H pylori 2008    reports that he quit smoking about 39 years ago. He quit smokeless tobacco use about 39 years ago. He reports that he does not drink alcohol or use drugs. family history includes Alcohol abuse in his paternal uncle; Colon cancer (age of onset: 8) in his father; Coronary artery disease in his father; Depression in his brother and mother; Emphysema in his father; Tuberculosis in his mother. Allergies  Allergen Reactions  . Chlorine     EYES WATER/NASAL CONGESTION   Current Outpatient Medications on File Prior to Visit  Medication Sig  Dispense Refill  . acetaminophen (TYLENOL) 325 MG tablet Take 650 mg by mouth daily as needed for mild pain, moderate pain or headache (pain).     Marland Kitchen apixaban (ELIQUIS) 5 MG TABS tablet Take 1 tablet (5 mg total) by mouth 2 (two) times daily. 180 tablet 1  . Ascorbic Acid (VITAMIN C) 100 MG tablet Take 100 mg by mouth daily.    . cholecalciferol (VITAMIN D) 1000 UNITS tablet Take 1,000 Units by mouth daily.    . Flaxseed, Linseed, (FLAXSEED OIL) 1000 MG CAPS Take by mouth as directed.     . metoprolol tartrate (LOPRESSOR) 25 MG tablet TAKE 1/2 TABLET(12.5 MG) BY MOUTH TWICE DAILY. MAY TAKE AN EXTRA 1/2 TABLET AS NEEDED FOR AFIB 135 tablet 1  . sildenafil (REVATIO) 20 MG tablet Take 3-5 tablets 1 hour prior to sexual activity 30 tablet 3  . thiamine (VITAMIN B-1) 100 MG tablet Take 100 mg by mouth daily.    . vitamin E 100 UNIT capsule Take 100 Units by mouth daily.      No current facility-administered medications on file prior to visit.   Review of Systems All otherwise neg per pt    Objective:   Physical Exam BP 120/68 (BP Location: Left Arm, Patient Position: Sitting, Cuff Size: Large)   Pulse 60   Temp 98.4 F (36.9 C) (Oral)   Ht 6' (  1.829 m)   Wt 167 lb (75.8 kg)   SpO2 98%   BMI 22.65 kg/m  VS noted,  Constitutional: Pt appears in NAD HENT: Head: NCAT.  Right Ear: External ear normal.  Left Ear: External ear normal.  Eyes: . Pupils are equal, round, and reactive to light. Conjunctivae and EOM are normal Nose: without d/c or deformity Neck: Neck supple. Gross normal ROM, no mass or swelling or LA, but has mild tedner left larynx Cardiovascular: Normal rate and regular rhythm.   Pulmonary/Chest: Effort normal and breath sounds without rales or wheezing.  Abd:  Soft, NT, ND, + BS, no organomegaly Neurological: Pt is alert. At baseline orientation, motor grossly intact Skin: Skin is warm. No rashes, other new lesions, no LE edema Psychiatric: Pt behavior is normal without  agitation  All otherwise neg per pt Lab Results  Component Value Date   WBC 5.9 11/01/2019   HGB 15.0 11/01/2019   HCT 43.5 11/01/2019   PLT 189.0 11/01/2019   GLUCOSE 72 11/01/2019   CHOL 153 11/01/2019   TRIG 85.0 11/01/2019   HDL 54.50 11/01/2019   LDLCALC 82 11/01/2019   ALT 15 11/01/2019   AST 20 11/01/2019   NA 140 11/01/2019   K 4.0 11/01/2019   CL 105 11/01/2019   CREATININE 0.86 11/01/2019   BUN 12 11/01/2019   CO2 29 11/01/2019   TSH 1.76 11/01/2019   PSA 0.44 11/01/2019      Assessment & Plan:

## 2020-02-15 NOTE — Patient Instructions (Signed)
Please take all new medication as prescribed - the anti-inflammatory  Please continue all other medications as before, and refills have been done if requested.  Please have the pharmacy call with any other refills you may need.  Please continue your efforts at being more active, low cholesterol diet, and weight control.  Please keep your appointments with your specialists as you may have planned

## 2020-02-15 NOTE — Assessment & Plan Note (Addendum)
Etiology unclear, for nsaid prn,  to f/u any worsening symptoms or concerns  I spent 31 minutes in preparing to see the patient by review of recent labs, imaging and procedures, obtaining and reviewing separately obtained history, communicating with the patient and family or caregiver, ordering medications, tests or procedures, and documenting clinical information in the EHR including the differential Dx, treatment, and any further evaluation and other management of throat pain, gerd, allergies

## 2020-02-20 ENCOUNTER — Telehealth: Payer: Self-pay | Admitting: Internal Medicine

## 2020-02-20 NOTE — Telephone Encounter (Signed)
    Pt c/o medication issue:  1. Name of Medication:meloxicam (MOBIC) 7.5 MG tablet  2. How are you currently taking this medication (dosage and times per day)? AS WRITTEN  3. Are you having a reaction (difficulty breathing--STAT)? no  4. What is your medication issue? Patient wants to discuss possible risks (bleeding) with taking medication

## 2020-02-22 NOTE — Telephone Encounter (Signed)
Hello   Regular use of this medication has less risk of bleeding than regular use of ibuprofen, but if he is wanting no risk, it would be fine to change to tylenol for pain, thanks

## 2020-02-22 NOTE — Progress Notes (Signed)
Cardiology Office Note   Date:  02/23/2020   ID:  [name], [name] [birth year], MRN [id number]  PCP:  Biagio Borg, MD    No chief complaint on file.  AFib  Wt Readings from Last 3 Encounters:  02/23/20 169 lb 9.6 oz (76.9 kg)  02/15/20 167 lb (75.8 kg)  11/01/19 167 lb (75.8 kg)       History of Present Illness: Michael Lindsey is a 69 y.o. male  Who hadpalpitations and was found to have PAF several years ago.  Prior records show: "His wife was watching him for sleep apnea.She has not seen any. He does snore, worse with alcohol consumption.  His watch recorded some fast heart rate during sleep on one night.  Sleep study was done in 8/18.   He has had episodic AFib. He has had episodes with stress and when he takes his metoprolol later than usual.  Alcohol has been a consistent trigger for AFib. He can drink Becks nonalcoholic beer without problems.   He called in and reported more palpitations: " He states that he has had more frequent episodes of his heart racing. He takes metoprolol 12.5 mg BID. He states that Tuesday night he took 50 mg instead of his 12.5 mg scheduled dose and again Wednesday morning. He states that when he just takes the extra 12.5 mg in addition to his scheduled 12.5 mg dose or even an additional 25 mg it does not help. He went back to his regular 12.5 mg dose Wednesday night and yesterday morning he had to take 50 mg again. He denies other Sx other than feeling his heart racing. He states that he is not sure how high it gets but the highest that he saw was 122 bpm. Patient's resting HR is around 54 bpm and BP around 110/70. "  Since th elast visit, he has had more AFib.  He had several days of sx after having a GI bug- stomach upset; felt like gas; then he had some diarrhea.  He remains active.  He has been taking extra metoprolol, without resolution of his sx.  He was vaccinated for COVID.     Past Medical History:  Diagnosis Date   . Erectile dysfunction 11/01/2019  . Fracture    L arm @ 11; 3 ribs with shoulder separation & PTX 2003  . Gastritis 09/13/2007   EGD  . GERD (gastroesophageal reflux disease)   . Hepatitis A 1972   from exposure to septic tank which malfunctioned  . Internal hemorrhoids 09/13/2007   COLON  . Osteoporosis   . Paroxysmal atrial fibrillation (HCC)    chads2vasc score is 1    Past Surgical History:  Procedure Laterality Date  . cataract surgery  10/2014  . COLONOSCOPY  2011   Dr Sharlett Iles  . HERNIA REPAIR    . SKIN CANCER EXCISION  2011   R calf, Dr Jarome Matin  . UPPER GASTROINTESTINAL ENDOSCOPY  2003 & 2008    H pylori 2008     Current Outpatient Medications  Medication Sig Dispense Refill  . acetaminophen (TYLENOL) 325 MG tablet Take 650 mg by mouth daily as needed for mild pain, moderate pain or headache (pain).     Marland Kitchen apixaban (ELIQUIS) 5 MG TABS tablet Take 1 tablet (5 mg total) by mouth 2 (two) times daily. 180 tablet 1  . Ascorbic Acid (VITAMIN C) 100 MG tablet Take 100 mg by mouth daily.    . cholecalciferol (  VITAMIN D) 1000 UNITS tablet Take 1,000 Units by mouth daily.    . Flaxseed, Linseed, (FLAXSEED OIL) 1000 MG CAPS Take by mouth as directed.     . meloxicam (MOBIC) 7.5 MG tablet Take 1 tablet (7.5 mg total) by mouth daily as needed for pain. 30 tablet 2  . metoprolol tartrate (LOPRESSOR) 25 MG tablet TAKE 1/2 TABLET(12.5 MG) BY MOUTH TWICE DAILY. MAY TAKE AN EXTRA 1/2 TABLET AS NEEDED FOR AFIB 135 tablet 1  . sildenafil (REVATIO) 20 MG tablet Take 3-5 tablets 1 hour prior to sexual activity 30 tablet 3  . thiamine (VITAMIN B-1) 100 MG tablet Take 100 mg by mouth daily.    . vitamin E 100 UNIT capsule Take 100 Units by mouth daily.      No current facility-administered medications for this visit.    Allergies:   Chlorine    Social History:  The patient  reports that he quit smoking about 39 years ago. He quit smokeless tobacco use about 39 years ago. He reports  that he does not drink alcohol or use drugs.   Family History:  The patient's family history includes Alcohol abuse in his paternal uncle; Colon cancer (age of onset: 52) in his father; Coronary artery disease in his father; Depression in his brother and mother; Emphysema in his father; Tuberculosis in his mother.    ROS:  Please see the history of present illness.   Otherwise, review of systems are positive for .   All other systems are reviewed and negative.    PHYSICAL EXAM: VS:  BP 96/60   Pulse (!) 59   Ht 6' (1.829 m)   Wt 169 lb 9.6 oz (76.9 kg)   SpO2 97%   BMI 23.00 kg/m  , BMI Body mass index is 23 kg/m. GEN: Well nourished, well developed, in no acute distress  HEENT: normal  Neck: no JVD, carotid bruits, or masses Cardiac: irregularly irregular; no murmurs, rubs, or gallops,no edema  Respiratory:  clear to auscultation bilaterally, normal work of breathing GI: soft, nontender, nondistended, + BS MS: no deformity or atrophy  Skin: warm and dry, no rash Neuro:  Strength and sensation are intact Psych: euthymic mood, full affect   EKG:   The ekg ordered today demonstrates AFib, rate controlled   Recent Labs: 11/01/2019: ALT 15; BUN 12; Creatinine, Ser 0.86; Hemoglobin 15.0; Platelets 189.0; Potassium 4.0; Sodium 140; TSH 1.76   Lipid Panel    Component Value Date/Time   CHOL 153 11/01/2019 0846   TRIG 85.0 11/01/2019 0846   HDL 54.50 11/01/2019 0846   CHOLHDL 3 11/01/2019 0846   VLDL 17.0 11/01/2019 0846   LDLCALC 82 11/01/2019 0846     Other studies Reviewed: Additional studies/ records that were reviewed today with results demonstrating: TSH normal in Jan 2021.   ASSESSMENT AND PLAN:  1. PAF: Increased episodes of AFib.  Considering ablation vs. AAD.  He is symptomatic with HR at 60 in AFib. Will send back to Dr. Rayann Heman.  Increase metoprolol 25 mg BID. 2. Anticoagulated: No bleeding issues.  Eliquids for stroke prevnetion.  Using Apple watch as  well. 3. Recent GI issues resolved.  Back to eating and drinking normally.  No further nausea..   Current medicines are reviewed at length with the patient today.  The patient concerns regarding his medicines were addressed.  The following changes have been made:  No change  Labs/ tests ordered today include:  No orders of the defined types  were placed in this encounter.   Recommend 150 minutes/week of aerobic exercise Low fat, low carb, high fiber diet recommended  Disposition:   FU with EP   Signed, Larae Grooms, MD  02/23/2020 8:33 AM    [facility] [place], [place], [place]  [address] Phone: [phone]; Fax: [phone]

## 2020-02-23 ENCOUNTER — Other Ambulatory Visit: Payer: Self-pay

## 2020-02-23 ENCOUNTER — Ambulatory Visit (INDEPENDENT_AMBULATORY_CARE_PROVIDER_SITE_OTHER): Payer: Medicare Other | Admitting: Interventional Cardiology

## 2020-02-23 ENCOUNTER — Encounter: Payer: Self-pay | Admitting: Interventional Cardiology

## 2020-02-23 VITALS — BP 96/60 | HR 59 | Ht 72.0 in | Wt 169.6 lb

## 2020-02-23 DIAGNOSIS — R110 Nausea: Secondary | ICD-10-CM | POA: Diagnosis not present

## 2020-02-23 DIAGNOSIS — Z7901 Long term (current) use of anticoagulants: Secondary | ICD-10-CM | POA: Diagnosis not present

## 2020-02-23 DIAGNOSIS — I480 Paroxysmal atrial fibrillation: Secondary | ICD-10-CM | POA: Diagnosis not present

## 2020-02-23 MED ORDER — METOPROLOL TARTRATE 25 MG PO TABS: 25.0000 mg | ORAL_TABLET | Freq: Two times a day (BID) | ORAL | 3 refills | Status: DC

## 2020-02-23 NOTE — Patient Instructions (Signed)
Medication Instructions:  Your physician has recommended you make the following change in your medication:   INCREASE: metoprolol tartrate (lopressor) to 25 mg twice a day  *If you need a refill on your cardiac medications before your next appointment, please call your pharmacy*   Lab Work: None ordered  If you have labs (blood work) drawn today and your tests are completely normal, you will receive your results only by: Marland Kitchen MyChart Message (if you have MyChart) OR . A paper copy in the mail If you have any lab test that is abnormal or we need to change your treatment, we will call you to review the results.   Testing/Procedures: None ordered   Follow-Up: Follow up with Dr. Rayann Heman via [place] on 03/12/20 at 3:00 PM   Other Instructions None

## 2020-02-24 NOTE — Telephone Encounter (Signed)
Tried calling pt to advise him of Dr. Gwynn Burly note. However, pt mailbox is full and I was unable to leave a vm for pt.

## 2020-03-09 ENCOUNTER — Telehealth: Payer: Self-pay

## 2020-03-09 NOTE — Telephone Encounter (Signed)
Called pt to confirm virtual appt on 03/12/20. Could not contact pt and was unable to leave a message due to his mailbox being full.

## 2020-03-12 ENCOUNTER — Telehealth: Payer: Self-pay

## 2020-03-12 ENCOUNTER — Other Ambulatory Visit: Payer: Self-pay

## 2020-03-12 ENCOUNTER — Telehealth (INDEPENDENT_AMBULATORY_CARE_PROVIDER_SITE_OTHER): Payer: Medicare Other | Admitting: Internal Medicine

## 2020-03-12 DIAGNOSIS — I480 Paroxysmal atrial fibrillation: Secondary | ICD-10-CM | POA: Diagnosis not present

## 2020-03-12 DIAGNOSIS — D6869 Other thrombophilia: Secondary | ICD-10-CM | POA: Diagnosis not present

## 2020-03-12 DIAGNOSIS — I4891 Unspecified atrial fibrillation: Secondary | ICD-10-CM

## 2020-03-12 MED ORDER — FLECAINIDE ACETATE 50 MG PO TABS
50.0000 mg | ORAL_TABLET | Freq: Two times a day (BID) | ORAL | 3 refills | Status: DC
Start: 2020-03-12 — End: 2020-09-17

## 2020-03-12 NOTE — Progress Notes (Signed)
Electrophysiology TeleHealth Note   Due to national recommendations of social distancing due to COVID 19, an audio/video telehealth visit is felt to be most appropriate for this patient at this time.  See MyChart message from today for the patient's consent to telehealth for [facility].  Date:  03/12/2020   ID:  Michael Lindsey, DOB 1951/07/04, MRN [id number]  Location: patient's home  Provider location:  [place]  Evaluation Performed: Follow-up visit  PCP:  Biagio Borg, MD   Electrophysiologist:  Dr Rayann Heman  Chief Complaint:  palpitations  History of Present Illness:    Michael Lindsey is a 69 y.o. male who presents via telehealth conferencing today.  Since last being seen in our clinic, the patient reports doing very well.  He reports increasing frequency and duration of atrial fibrillation.  This past weekend, he had afib. Triggers seem to be getting hot or tired.  He has quite drinking ETOH which was previously a trigger. He has symptoms of palpitations and fatigue.  He reports decreased exercise tolerance in afib.  Today, he denies symptoms of chest pain, shortness of breath,  lower extremity edema, dizziness, presyncope, or syncope.  The patient is otherwise without complaint today.   Past Medical History:  Diagnosis Date  . Erectile dysfunction 11/01/2019  . Fracture    L arm @ 11; 3 ribs with shoulder separation & PTX 2003  . Gastritis 09/13/2007   EGD  . GERD (gastroesophageal reflux disease)   . Hepatitis A 1972   from exposure to septic tank which malfunctioned  . Internal hemorrhoids 09/13/2007   COLON  . Osteoporosis   . Paroxysmal atrial fibrillation (HCC)    chads2vasc score is 1    Past Surgical History:  Procedure Laterality Date  . cataract surgery  10/2014  . COLONOSCOPY  2011   Dr Sharlett Iles  . HERNIA REPAIR    . SKIN CANCER EXCISION  2011   R calf, Dr Jarome Matin  . UPPER GASTROINTESTINAL ENDOSCOPY  2003 & 2008    H pylori 2008     Current Outpatient Medications  Medication Sig Dispense Refill  . acetaminophen (TYLENOL) 325 MG tablet Take 650 mg by mouth daily as needed for mild pain, moderate pain or headache (pain).     Marland Kitchen apixaban (ELIQUIS) 5 MG TABS tablet Take 1 tablet (5 mg total) by mouth 2 (two) times daily. 180 tablet 1  . Ascorbic Acid (VITAMIN C) 100 MG tablet Take 100 mg by mouth daily.    . cholecalciferol (VITAMIN D) 1000 UNITS tablet Take 1,000 Units by mouth daily.    . Flaxseed, Linseed, (FLAXSEED OIL) 1000 MG CAPS Take by mouth as directed.     . meloxicam (MOBIC) 7.5 MG tablet Take 1 tablet (7.5 mg total) by mouth daily as needed for pain. 30 tablet 2  . metoprolol tartrate (LOPRESSOR) 25 MG tablet Take 1 tablet (25 mg total) by mouth 2 (two) times daily. 180 tablet 3  . sildenafil (REVATIO) 20 MG tablet Take 3-5 tablets 1 hour prior to sexual activity 30 tablet 3  . thiamine (VITAMIN B-1) 100 MG tablet Take 100 mg by mouth daily.    . vitamin E 100 UNIT capsule Take 100 Units by mouth daily.      No current facility-administered medications for this visit.    Allergies:   Chlorine   Social History:  The patient  reports that he quit smoking about 39 years ago. He quit smokeless  tobacco use about 39 years ago. He reports that he does not drink alcohol or use drugs.   ROS:  Please see the history of present illness.   All other systems are personally reviewed and negative.    Exam:    Vital Signs:  There were no vitals taken for this visit.  Well sounding and appearing, alert and conversant, regular work of breathing,  good skin color Eyes- anicteric, neuro- grossly intact, skin- no apparent rash or lesions or cyanosis, mouth- oral mucosa is pink  Labs/Other Tests and Data Reviewed:    Recent Labs: 11/01/2019: ALT 15; BUN 12; Creatinine, Ser 0.86; Hemoglobin 15.0; Platelets 189.0; Potassium 4.0; Sodium 140; TSH 1.76   Wt Readings from Last 3 Encounters:  02/23/20 169 lb 9.6 oz (76.9 kg)   02/15/20 167 lb (75.8 kg)  11/01/19 167 lb (75.8 kg)      ASSESSMENT & PLAN:    1.  Paroxysmal atrial fibrillation He follows with his apple watch and is clear that his AF is paroxysmal The patient has symptomatic, recurrent paroxysmal atrial fibrillation.  Chads2vasc score is 1.  he is anticoagulated with eliquis Therapeutic strategies for afib including medicine (flecainide, tikosyn, amiodarone) and ablation were discussed in detail with the patient today. Risk, benefits, and alternatives to each approach were discussed at length. He is leaning towards ablation but would prefer to try medicine first.  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  Start flecainide 50mg  BID He will need Ekg in 1 week  Follow-up with me in 6 weeks unless he decides to pursue ablation in the interim.   Risks, benefits and potential toxicities for medications prescribed and/or refilled reviewed with patient today.     Patient Risk:  after full review of this patients clinical status, I feel that they are at moderate risk at this time.  Today, I have spent 25 minutes with the patient with telehealth technology discussing arrhythmia management .    Army Fossa, MD  03/12/2020 3:08 PM     [address] [place] [address] [phone] (office) [phone] (fax)

## 2020-03-12 NOTE — Telephone Encounter (Signed)
Flecainide sent to [facility] on [address].  Will contact Pt tomorrow to set up EKG and discuss ablation.

## 2020-03-12 NOTE — Telephone Encounter (Signed)
-----   Message from Thompson Grayer, MD sent at 03/12/2020  3:32 PM EDT ----- Start flecainide 50mg  BID He will need an ekg in 1 week Follow-up with me in 6 weeks   He would like to talk to you tomorrow (Tuesday) about ablation.  He will probably schedule but wasn't ready to commit today.

## 2020-03-14 ENCOUNTER — Telehealth: Payer: Self-pay | Admitting: Internal Medicine

## 2020-03-14 NOTE — Telephone Encounter (Signed)
Follow up   Patient would like a call back to discuss having the ablation on a different day. Please call to discuss.

## 2020-03-14 NOTE — Telephone Encounter (Signed)
Pt cannot do 8/3.  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  Informed pt that Sonia Baller, RN will be in touch Friday via Mychart/telephone to arrange procedure date for September. Pt will start Flecainide, scheduled for EKG next Thursday. Patient verbalized understanding and agreeable to plan.

## 2020-03-14 NOTE — Telephone Encounter (Signed)
Pt called to schedule an Ablation. Please call back

## 2020-03-14 NOTE — Telephone Encounter (Signed)
Pt would like to schedule AFib ablation for 8/3. Aware I will call back today with further instructions on Flecainide. Aware I will send CT/Ablation instructions via mychart. covid screening scheduled.

## 2020-03-19 ENCOUNTER — Telehealth: Payer: Self-pay | Admitting: Interventional Cardiology

## 2020-03-19 DIAGNOSIS — I4891 Unspecified atrial fibrillation: Secondary | ICD-10-CM

## 2020-03-19 NOTE — Telephone Encounter (Signed)
Pt scheduled for afib ablation on 06/14/20 at 7:30 am  Labs scheduled for 8/25  Need to schedule covid test at new test site (awaiting instruction)  Mailing instruction letters to Pt.  Work up complete

## 2020-03-19 NOTE — Telephone Encounter (Signed)
OK to schedule echo for AFib.  JV

## 2020-03-19 NOTE — Telephone Encounter (Signed)
Patient has ben scheduled for echo on 6/21

## 2020-03-19 NOTE — Telephone Encounter (Signed)
Called and spoke to Michael Lindsey. He states that Dr. Irish Lack mentioned having another echocardiogram done. Michael Lindsey wanting to know if he would still like for him to have this done. I do not see this mentioned in last OV note. Last echo 2017. Michael Lindsey denies having any chest pain, SOB, LEE, or any other Sx other than frequent afib episodes for which EP is managing (recent flecainide start and is scheduled for an Afib ablation on 9/9). Will forward to Dr. Irish Lack for review and recommendation.

## 2020-03-19 NOTE — Telephone Encounter (Signed)
New Message   Patient is calling because he states that Dr. Irish Lack said he needed to have an echocardiogram. I am not showing an order. Please call patient.

## 2020-03-22 ENCOUNTER — Other Ambulatory Visit: Payer: Self-pay

## 2020-03-22 ENCOUNTER — Ambulatory Visit (INDEPENDENT_AMBULATORY_CARE_PROVIDER_SITE_OTHER): Payer: Medicare Other

## 2020-03-22 VITALS — HR 52

## 2020-03-22 DIAGNOSIS — I480 Paroxysmal atrial fibrillation: Secondary | ICD-10-CM

## 2020-03-22 NOTE — Progress Notes (Signed)
1.) Reason for visit: new start flecainide  2.) Name of MD requesting visit: Dr. Rayann Heman  3.) H&P: Pt with paro afib.   4.) ROS related to problem: Pt started on flecainide in anticipation of atrial fib ablation scheduled for June 14, 2020  5.) Assessment and plan per MD: EKG preliminarily reviewed by DOD.  QRS ok.  Pt in SR today.  Will forward to ordering MD.

## 2020-03-26 ENCOUNTER — Other Ambulatory Visit (HOSPITAL_COMMUNITY): Payer: Medicare Other

## 2020-04-16 ENCOUNTER — Telehealth: Payer: Self-pay | Admitting: Internal Medicine

## 2020-04-16 NOTE — Telephone Encounter (Signed)
    Pt is wondering if he can r/s his ablation to late august. He also wanted to know it its ok having his echo tomorrow or needs to r/s closer to his procedure

## 2020-04-17 ENCOUNTER — Other Ambulatory Visit (HOSPITAL_COMMUNITY): Payer: Medicare Other

## 2020-04-17 ENCOUNTER — Telehealth: Payer: Self-pay | Admitting: Internal Medicine

## 2020-04-17 NOTE — Telephone Encounter (Signed)
°  Patient is calling because he would like to know if he should come to his echocardiogram appointment today since his procedure date was pushed back. He wants to make sure this is not too early to have the echo done. Please advise.

## 2020-04-17 NOTE — Telephone Encounter (Signed)
Spoke with the patient who states that he would like to reschedule his echocardiogram. Patient has been rescheduled for 07/29.

## 2020-04-18 NOTE — Telephone Encounter (Signed)
Attempted to call patient to reschedule Afib Ablation.   Patient states he will call back.

## 2020-05-01 NOTE — Telephone Encounter (Signed)
Attempted to contact Pt via phone and mychart.  Pt has not returned phone call.  Pt has f/u 05/07/20 with Dr. Rayann Heman.  Will discuss timing of ablation at that visit.

## 2020-05-03 ENCOUNTER — Other Ambulatory Visit (HOSPITAL_COMMUNITY): Payer: Medicare Other

## 2020-05-05 ENCOUNTER — Other Ambulatory Visit (HOSPITAL_COMMUNITY): Payer: Medicare Other

## 2020-05-07 ENCOUNTER — Encounter: Payer: Self-pay | Admitting: *Deleted

## 2020-05-07 ENCOUNTER — Other Ambulatory Visit: Payer: Self-pay

## 2020-05-07 ENCOUNTER — Ambulatory Visit (HOSPITAL_COMMUNITY): Payer: Medicare Other | Attending: Cardiology

## 2020-05-07 ENCOUNTER — Encounter: Payer: Self-pay | Admitting: Internal Medicine

## 2020-05-07 ENCOUNTER — Ambulatory Visit (INDEPENDENT_AMBULATORY_CARE_PROVIDER_SITE_OTHER): Payer: Medicare Other | Admitting: Internal Medicine

## 2020-05-07 VITALS — BP 102/64 | HR 50 | Ht 72.0 in | Wt 171.0 lb

## 2020-05-07 DIAGNOSIS — I4891 Unspecified atrial fibrillation: Secondary | ICD-10-CM | POA: Insufficient documentation

## 2020-05-07 DIAGNOSIS — I480 Paroxysmal atrial fibrillation: Secondary | ICD-10-CM | POA: Diagnosis not present

## 2020-05-07 LAB — ECHOCARDIOGRAM COMPLETE
Area-P 1/2: 2.21 cm2
S' Lateral: 2.7 cm

## 2020-05-07 NOTE — Patient Instructions (Addendum)
Medication Instructions:  Your physician recommends that you continue on your current medications as directed. Please refer to the Current Medication list given to you today.  *If you need a refill on your cardiac medications before your next appointment, please call your pharmacy*  Lab Work: None ordered.  If you have labs (blood work) drawn today and your tests are completely normal, you will receive your results only by: . MyChart Message (if you have MyChart) OR . A paper copy in the mail If you have any lab test that is abnormal or we need to change your treatment, we will call you to review the results.  Testing/Procedures: None ordered.  Follow-Up: At [facility], you and your health needs are our priority.  As part of our continuing mission to provide you with exceptional heart care, we have created designated Provider Care Teams.  These Care Teams include your primary Cardiologist (physician) and Advanced Practice Providers (APPs -  Physician Assistants and Nurse Practitioners) who all work together to provide you with the care you need, when you need it.  We recommend signing up for the patient portal called "MyChart".  Sign up information is provided on this After Visit Summary.  MyChart is used to connect with patients for Virtual Visits (Telemedicine).  Patients are able to view lab/test results, encounter notes, upcoming appointments, etc.  Non-urgent messages can be sent to your provider as well.   To learn more about what you can do with MyChart, go to [email or web address].      Other Instructions:  

## 2020-05-07 NOTE — Progress Notes (Signed)
PCP: Biagio Borg, MD   Primary EP: Dr Anice Paganini Michael Lindsey is a 68 y.o. male who presents today for routine electrophysiology followup.  Since last being seen in our clinic, the patient reports doing very well. He continues to have afib despite flecainide.  Today, he denies symptoms of palpitations, chest pain, shortness of breath,  lower extremity edema, dizziness, presyncope, or syncope.  The patient is otherwise without complaint today.   Past Medical History:  Diagnosis Date   Erectile dysfunction 11/01/2019   Fracture    L arm @ 11; 3 ribs with shoulder separation & PTX 2003   Gastritis 09/13/2007   EGD   GERD (gastroesophageal reflux disease)    Hepatitis A 1972   from exposure to septic tank which malfunctioned   Internal hemorrhoids 09/13/2007   COLON   Osteoporosis    Paroxysmal atrial fibrillation (HCC)    chads2vasc score is 1   Past Surgical History:  Procedure Laterality Date   cataract surgery  10/2014   COLONOSCOPY  2011   Dr Sharlett Iles   HERNIA REPAIR     SKIN CANCER EXCISION  2011   R calf, Dr Jarome Matin   UPPER GASTROINTESTINAL ENDOSCOPY  2003 & 2008    H pylori 2008    ROS- all systems are reviewed and negatives except as per HPI above  Current Outpatient Medications  Medication Sig Dispense Refill   acetaminophen (TYLENOL) 325 MG tablet Take 650 mg by mouth daily as needed for mild pain, moderate pain or headache (pain).      apixaban (ELIQUIS) 5 MG TABS tablet Take 1 tablet (5 mg total) by mouth 2 (two) times daily. 180 tablet 1   Ascorbic Acid (VITAMIN C) 100 MG tablet Take 100 mg by mouth daily.     cholecalciferol (VITAMIN D) 1000 UNITS tablet Take 1,000 Units by mouth daily.     Flaxseed, Linseed, (FLAXSEED OIL) 1000 MG CAPS Take by mouth as directed.      flecainide (TAMBOCOR) 50 MG tablet Take 1 tablet (50 mg total) by mouth 2 (two) times daily. 180 tablet 3   metoprolol tartrate (LOPRESSOR) 25 MG tablet Take 1 tablet  (25 mg total) by mouth 2 (two) times daily. 180 tablet 3   sildenafil (REVATIO) 20 MG tablet Take 3-5 tablets 1 hour prior to sexual activity 30 tablet 3   thiamine (VITAMIN B-1) 100 MG tablet Take 100 mg by mouth daily.     vitamin E 100 UNIT capsule Take 100 Units by mouth daily.      No current facility-administered medications for this visit.    Physical Exam: Vitals:   05/07/20 1234  BP: 102/64  Pulse: (!) 50  SpO2: 97%  Weight: 171 lb (77.6 kg)  Height: 6' (1.829 m)    GEN- The patient is well appearing, alert and oriented x 3 today.   Head- normocephalic, atraumatic Eyes-  Sclera clear, conjunctiva pink Ears- hearing intact Oropharynx- clear Lungs- Clear to ausculation bilaterally, normal work of breathing Heart- Regular rate and rhythm, no murmurs, rubs or gallops, PMI not laterally displaced GI- soft, NT, ND, + BS Extremities- no clubbing, cyanosis, or edema  Wt Readings from Last 3 Encounters:  05/07/20 171 lb (77.6 kg)  02/23/20 169 lb 9.6 oz (76.9 kg)  02/15/20 167 lb (75.8 kg)    EKG tracing ordered today is personally reviewed and shows sinus 50 bpm, PR 210 msec  Assessment and Plan:  1. Paroxysmal atrial fibrillation  He has recurrent symptomatic afib chads2vasc score is 1.  He is on eliquis He has failed medical therapy with flecainide.  Given 1st degree AF block I do not feel that we should increase flecainide at this time. Therapeutic strategies for afib including medicine and ablation were discussed in detail with the patient today. Risk, benefits, and alternatives to EP study and radiofrequency ablation for afib were also discussed in detail today. These risks include but are not limited to stroke, bleeding, vascular damage, tamponade, perforation, damage to the esophagus, lungs, and other structures, pulmonary vein stenosis, worsening renal function, and death. The patient understands these risk and wishes to proceed.  We will therefore proceed with  catheter ablation at the next available time.  Carto, ICE, anesthesia are requested for the procedure.  Will also obtain cardiac CT prior to the procedure to exclude LAA thrombus and further evaluate atrial anatomy.   Risks, benefits and potential toxicities for medications prescribed and/or refilled reviewed with patient today.   Thompson Grayer MD, [facility] 05/07/2020 12:46 PM

## 2020-05-29 ENCOUNTER — Telehealth: Payer: Self-pay | Admitting: Internal Medicine

## 2020-05-29 ENCOUNTER — Other Ambulatory Visit: Payer: Medicare Other

## 2020-05-29 ENCOUNTER — Other Ambulatory Visit: Payer: Self-pay

## 2020-05-29 DIAGNOSIS — Z20822 Contact with and (suspected) exposure to covid-19: Secondary | ICD-10-CM

## 2020-05-29 NOTE — Telephone Encounter (Signed)
Spoke with pt and he wanted to make sure ok to do pre procedure labs on 9/7 since he has been exposed to Covid and is getting tested today.  Advised pt I will schedule him for the 7th and if that is an issue, then Dr. Jackalyn Lombard nurse will contact him to reschedule.  Advised other concern would be if he comes back positive, this may delay things.

## 2020-05-29 NOTE — Telephone Encounter (Signed)
Patient states he came in contact with covid and his wife is having symptoms. He states he is having his covid test today. He would like to know if rescheduling his lab work to 06/12/2020 will be okay, since his ablation is scheduled 06/14/2020

## 2020-05-30 ENCOUNTER — Other Ambulatory Visit: Payer: Medicare Other

## 2020-05-30 LAB — SARS-COV-2, NAA 2 DAY TAT

## 2020-05-30 LAB — NOVEL CORONAVIRUS, NAA: SARS-CoV-2, NAA: NOT DETECTED

## 2020-05-30 NOTE — Telephone Encounter (Signed)
Advised the patient that we need to reschedule his labs to get them completed before his CT scan. He verbalized understanding. Labs now 06/07/20.   Discussed Cardiac CT scan instructions with the patient; verbalized understanding. Advised to call if he has any questions.

## 2020-05-30 NOTE — Telephone Encounter (Signed)
Patient returning call.

## 2020-06-01 ENCOUNTER — Other Ambulatory Visit: Payer: Self-pay

## 2020-06-01 ENCOUNTER — Other Ambulatory Visit: Payer: Medicare Other

## 2020-06-01 DIAGNOSIS — Z20822 Contact with and (suspected) exposure to covid-19: Secondary | ICD-10-CM | POA: Diagnosis not present

## 2020-06-02 LAB — NOVEL CORONAVIRUS, NAA: SARS-CoV-2, NAA: NOT DETECTED

## 2020-06-02 LAB — SARS-COV-2, NAA 2 DAY TAT

## 2020-06-04 ENCOUNTER — Telehealth: Payer: Self-pay | Admitting: Internal Medicine

## 2020-06-04 NOTE — Telephone Encounter (Signed)
New Message:   Pt is cancelling his appointments for  His Lab and CT. He have been exposed to [place], but he is negative. He wants to know should he cx his Procedure for 06-14-20?

## 2020-06-05 NOTE — Telephone Encounter (Signed)
Called patient and voice mail box is full

## 2020-06-05 NOTE — Telephone Encounter (Signed)
     Pt is calling back to follow up, he said their quarantine will be done in Sunday, he would like to make sure that he has enough time to complete everything before the ablation. He need to r/s everything after Sunday

## 2020-06-05 NOTE — Telephone Encounter (Signed)
Patient is covid negative X2  Patient has no symptoms at this time   Patient will be retested before procedure on 9/7  IF any of these thing change there in a plan in place with the patient.  Labs and CT scheduled as previously. Okay to proceed at this time.   Patient verbalized understanding.

## 2020-06-06 ENCOUNTER — Telehealth (HOSPITAL_COMMUNITY): Payer: Self-pay | Admitting: Emergency Medicine

## 2020-06-06 NOTE — Telephone Encounter (Signed)
Reaching out to patient to offer assistance regarding upcoming cardiac imaging study; pt verbalizes understanding of appt date/time, parking situation and where to check in, pre-test NPO status and medications ordered, and verified current allergies; name and call back number provided for further questions should they arise Ajay Strubel RN Navigator Cardiac Imaging [facility] [phone] office [phone] cell 

## 2020-06-07 ENCOUNTER — Other Ambulatory Visit: Payer: Medicare Other

## 2020-06-07 ENCOUNTER — Other Ambulatory Visit: Payer: Self-pay

## 2020-06-07 DIAGNOSIS — I4891 Unspecified atrial fibrillation: Secondary | ICD-10-CM

## 2020-06-07 LAB — BASIC METABOLIC PANEL
BUN/Creatinine Ratio: 12 (ref 10–24)
BUN: 12 mg/dL (ref 8–27)
CO2: 22 mmol/L (ref 20–29)
Calcium: 8.8 mg/dL (ref 8.6–10.2)
Chloride: 105 mmol/L (ref 96–106)
Creatinine, Ser: 1 mg/dL (ref 0.76–1.27)
GFR calc Af Amer: 88 mL/min/{1.73_m2} (ref >59)
GFR calc non Af Amer: 76 mL/min/{1.73_m2} (ref >59)
Glucose: 82 mg/dL (ref 65–99)
Potassium: 4.3 mmol/L (ref 3.5–5.2)
Sodium: 139 mmol/L (ref 134–144)

## 2020-06-07 LAB — CBC WITH DIFFERENTIAL/PLATELET
Basophils Absolute: 0 10*3/uL (ref 0.0–0.2)
Basos: 0 %
EOS (ABSOLUTE): 0.3 10*3/uL (ref 0.0–0.4)
Eos: 6 %
Hematocrit: 41.5 % (ref 37.5–51.0)
Hemoglobin: 14.4 g/dL (ref 13.0–17.7)
Immature Grans (Abs): 0 10*3/uL (ref 0.0–0.1)
Immature Granulocytes: 0 %
Lymphocytes Absolute: 1 10*3/uL (ref 0.7–3.1)
Lymphs: 18 %
MCH: 32.7 pg (ref 26.6–33.0)
MCHC: 34.7 g/dL (ref 31.5–35.7)
MCV: 94 fL (ref 79–97)
Monocytes Absolute: 0.7 10*3/uL (ref 0.1–0.9)
Monocytes: 13 %
Neutrophils Absolute: 3.3 10*3/uL (ref 1.4–7.0)
Neutrophils: 63 %
Platelets: 220 10*3/uL (ref 150–450)
RBC: 4.41 x10E6/uL (ref 4.14–5.80)
RDW: 13 % (ref 11.6–15.4)
WBC: 5.2 10*3/uL (ref 3.4–10.8)

## 2020-06-08 ENCOUNTER — Ambulatory Visit (HOSPITAL_COMMUNITY)
Admission: RE | Admit: 2020-06-08 | Discharge: 2020-06-08 | Disposition: A | Discharge status: Home / Self Care | Payer: Medicare Other | Source: Ambulatory Visit | Attending: Internal Medicine | Admitting: Internal Medicine

## 2020-06-08 ENCOUNTER — Ambulatory Visit (HOSPITAL_COMMUNITY): Payer: Medicare Other

## 2020-06-08 DIAGNOSIS — I4891 Unspecified atrial fibrillation: Secondary | ICD-10-CM | POA: Diagnosis not present

## 2020-06-08 MED ORDER — IOHEXOL 350 MG/ML SOLN
80.0000 mL | Freq: Once | INTRAVENOUS | Status: AC | PRN
  Administered 2020-06-08: 80 mL via INTRAVENOUS

## 2020-06-12 ENCOUNTER — Other Ambulatory Visit (HOSPITAL_COMMUNITY)
Admission: RE | Admit: 2020-06-12 | Discharge: 2020-06-12 | Disposition: A | Discharge status: Home / Self Care | Payer: Medicare Other | Source: Ambulatory Visit | Attending: Internal Medicine | Admitting: Internal Medicine

## 2020-06-12 DIAGNOSIS — Z20822 Contact with and (suspected) exposure to covid-19: Secondary | ICD-10-CM | POA: Diagnosis not present

## 2020-06-12 DIAGNOSIS — Z01812 Encounter for preprocedural laboratory examination: Secondary | ICD-10-CM | POA: Diagnosis not present

## 2020-06-13 ENCOUNTER — Telehealth: Payer: Self-pay | Admitting: Internal Medicine

## 2020-06-13 LAB — SARS CORONAVIRUS 2 (TAT 6-24 HRS): SARS Coronavirus 2: NEGATIVE

## 2020-06-13 NOTE — Telephone Encounter (Signed)
Patient's wife is calling in regarding ablation scheduled for 06/14/20 with Dr. Rayann Heman. She the patient faints easily and she is concerned that he may faint after procedure due to pain and BP decrease. Please return call to further discuss.

## 2020-06-13 NOTE — Telephone Encounter (Signed)
Patients wife wanted to make sure that the MD and nursing staff in the procedure area are aware that the patient vasovagals easy and this may occur post procedure.   Notified MD Allred. He will notify the cath lab staff in the morning.

## 2020-06-13 NOTE — Progress Notes (Signed)
Instructed patient on the following items: Arrival time 0530 Nothing to eat or drink after midnight No meds AM of procedure Responsible person to drive you home and stay with you for 24 hrs  Have you missed any doses of anti-coagulant on Eliquis- hasn't missed any doses

## 2020-06-14 ENCOUNTER — Ambulatory Visit (HOSPITAL_COMMUNITY)
Admission: RE | Disposition: A | Discharge status: Home / Self Care | Payer: Medicare Other | Source: Home / Self Care | Attending: Internal Medicine

## 2020-06-14 ENCOUNTER — Ambulatory Visit (HOSPITAL_COMMUNITY): Payer: Medicare Other | Admitting: Anesthesiology

## 2020-06-14 ENCOUNTER — Ambulatory Visit (HOSPITAL_COMMUNITY)
Admission: RE | Admit: 2020-06-14 | Discharge: 2020-06-14 | Disposition: A | Discharge status: Home / Self Care | Payer: Medicare Other | Attending: Internal Medicine | Admitting: Internal Medicine

## 2020-06-14 ENCOUNTER — Encounter (HOSPITAL_COMMUNITY): Payer: Self-pay | Admitting: Internal Medicine

## 2020-06-14 ENCOUNTER — Other Ambulatory Visit: Payer: Self-pay

## 2020-06-14 DIAGNOSIS — N529 Male erectile dysfunction, unspecified: Secondary | ICD-10-CM | POA: Diagnosis not present

## 2020-06-14 DIAGNOSIS — M170 Bilateral primary osteoarthritis of knee: Secondary | ICD-10-CM | POA: Diagnosis not present

## 2020-06-14 DIAGNOSIS — Z79899 Other long term (current) drug therapy: Secondary | ICD-10-CM | POA: Diagnosis not present

## 2020-06-14 DIAGNOSIS — I480 Paroxysmal atrial fibrillation: Secondary | ICD-10-CM | POA: Insufficient documentation

## 2020-06-14 DIAGNOSIS — Z7901 Long term (current) use of anticoagulants: Secondary | ICD-10-CM | POA: Insufficient documentation

## 2020-06-14 DIAGNOSIS — K219 Gastro-esophageal reflux disease without esophagitis: Secondary | ICD-10-CM | POA: Insufficient documentation

## 2020-06-14 HISTORY — PX: ATRIAL FIBRILLATION ABLATION: EP1191

## 2020-06-14 LAB — POCT ACTIVATED CLOTTING TIME
Activated Clotting Time: 252 seconds
Activated Clotting Time: 285 seconds

## 2020-06-14 SURGERY — ATRIAL FIBRILLATION ABLATION
Anesthesia: General

## 2020-06-14 MED ORDER — APIXABAN 5 MG PO TABS
5.0000 mg | ORAL_TABLET | Freq: Once | ORAL | Status: AC
  Administered 2020-06-14: 5 mg via ORAL
  Filled 2020-06-14: qty 1

## 2020-06-14 MED ORDER — ISOPROTERENOL HCL 0.2 MG/ML IJ SOLN
INTRAMUSCULAR | Status: AC
  Filled 2020-06-14: qty 5

## 2020-06-14 MED ORDER — HEPARIN SODIUM (PORCINE) 1000 UNIT/ML IJ SOLN
INTRAMUSCULAR | Status: DC | PRN
  Administered 2020-06-14: 4000 [IU] via INTRAVENOUS
  Administered 2020-06-14: 3000 [IU] via INTRAVENOUS

## 2020-06-14 MED ORDER — LIDOCAINE 2% (20 MG/ML) 5 ML SYRINGE
INTRAMUSCULAR | Status: DC | PRN
  Administered 2020-06-14: 60 mg via INTRAVENOUS

## 2020-06-14 MED ORDER — HEPARIN (PORCINE) IN NACL 1000-0.9 UT/500ML-% IV SOLN
INTRAVENOUS | Status: AC
  Filled 2020-06-14: qty 500

## 2020-06-14 MED ORDER — ROCURONIUM BROMIDE 10 MG/ML (PF) SYRINGE
PREFILLED_SYRINGE | INTRAVENOUS | Status: DC | PRN
  Administered 2020-06-14: 70 mg via INTRAVENOUS
  Administered 2020-06-14: 10 mg via INTRAVENOUS

## 2020-06-14 MED ORDER — HEPARIN SODIUM (PORCINE) 1000 UNIT/ML IJ SOLN
INTRAMUSCULAR | Status: DC | PRN
  Administered 2020-06-14: 14000 [IU] via INTRAVENOUS

## 2020-06-14 MED ORDER — SUGAMMADEX SODIUM 200 MG/2ML IV SOLN
INTRAVENOUS | Status: DC | PRN
  Administered 2020-06-14: 200 mg via INTRAVENOUS

## 2020-06-14 MED ORDER — SODIUM CHLORIDE 0.9 % IV SOLN: INTRAVENOUS | Status: DC

## 2020-06-14 MED ORDER — PANTOPRAZOLE SODIUM 40 MG PO TBEC: 40.0000 mg | DELAYED_RELEASE_TABLET | Freq: Every day | ORAL | 0 refills | Status: DC

## 2020-06-14 MED ORDER — HEPARIN SODIUM (PORCINE) 1000 UNIT/ML IJ SOLN
INTRAMUSCULAR | Status: AC
  Filled 2020-06-14: qty 1

## 2020-06-14 MED ORDER — DEXAMETHASONE SODIUM PHOSPHATE 10 MG/ML IJ SOLN
INTRAMUSCULAR | Status: DC | PRN
  Administered 2020-06-14: 10 mg via INTRAVENOUS

## 2020-06-14 MED ORDER — PROPOFOL 10 MG/ML IV BOLUS
INTRAVENOUS | Status: DC | PRN
  Administered 2020-06-14: 140 mg via INTRAVENOUS

## 2020-06-14 MED ORDER — ONDANSETRON HCL 4 MG/2ML IJ SOLN
INTRAMUSCULAR | Status: DC | PRN
  Administered 2020-06-14: 4 mg via INTRAVENOUS

## 2020-06-14 MED ORDER — ISOPROTERENOL HCL 0.2 MG/ML IJ SOLN
INTRAVENOUS | Status: DC | PRN
  Administered 2020-06-14: 20 ug/min via INTRAVENOUS

## 2020-06-14 MED ORDER — EPHEDRINE SULFATE-NACL 50-0.9 MG/10ML-% IV SOSY
PREFILLED_SYRINGE | INTRAVENOUS | Status: DC | PRN
  Administered 2020-06-14: 10 mg via INTRAVENOUS
  Administered 2020-06-14 (×2): 5 mg via INTRAVENOUS

## 2020-06-14 MED ORDER — PHENYLEPHRINE 40 MCG/ML (10ML) SYRINGE FOR IV PUSH (FOR BLOOD PRESSURE SUPPORT)
PREFILLED_SYRINGE | INTRAVENOUS | Status: DC | PRN
  Administered 2020-06-14: 80 ug via INTRAVENOUS

## 2020-06-14 MED ORDER — PROTAMINE SULFATE 10 MG/ML IV SOLN
INTRAVENOUS | Status: DC | PRN
  Administered 2020-06-14 (×3): 10 mg via INTRAVENOUS

## 2020-06-14 MED ORDER — SODIUM CHLORIDE 0.9% FLUSH: 3.0000 mL | Freq: Two times a day (BID) | INTRAVENOUS | Status: DC

## 2020-06-14 MED ORDER — FENTANYL CITRATE (PF) 100 MCG/2ML IJ SOLN
INTRAMUSCULAR | Status: DC | PRN
  Administered 2020-06-14: 100 ug via INTRAVENOUS

## 2020-06-14 SURGICAL SUPPLY — 21 items
BLANKET WARM UNDERBOD FULL ACC (MISCELLANEOUS) ×3 IMPLANT
CATH 8FR REPROCESSED SOUNDSTAR (CATHETERS) ×3 IMPLANT
CATH MAPPNG PENTARAY F 2-6-2MM (CATHETERS) ×1 IMPLANT
CATH S CIRCA THERM PROBE 10F (CATHETERS) ×3 IMPLANT
CATH SMTCH THERMOCOOL SF FJ (CATHETERS) ×2 IMPLANT
CATH WEBSTER BI DIR CS D-F CRV (CATHETERS) ×3 IMPLANT
COVER SWIFTLINK CONNECTOR (BAG) ×3 IMPLANT
DEVICE CLOSURE PERCLS PRGLD 6F (VASCULAR PRODUCTS) ×3 IMPLANT
PACK EP LATEX FREE (CUSTOM PROCEDURE TRAY) ×3
PACK EP LF (CUSTOM PROCEDURE TRAY) ×1 IMPLANT
PAD PRO RADIOLUCENT 2001M-C (PAD) ×3 IMPLANT
PATCH CARTO3 (PAD) ×2 IMPLANT
PENTARAY F 2-6-2MM (CATHETERS) ×3
PERCLOSE PROGLIDE 6F (VASCULAR PRODUCTS) ×9
SHEATH BAYLIS TRANSSEPTAL 98CM (NEEDLE) ×2 IMPLANT
SHEATH CARTO VIZIGO SM CVD (SHEATH) ×2 IMPLANT
SHEATH PINNACLE 7F 10CM (SHEATH) ×2 IMPLANT
SHEATH PINNACLE 8F 10CM (SHEATH) ×3 IMPLANT
SHEATH PINNACLE 9F 10CM (SHEATH) ×3 IMPLANT
SHEATH PROBE COVER 6X72 (BAG) ×3 IMPLANT
TUBING SMART ABLATE COOLFLOW (TUBING) ×3 IMPLANT

## 2020-06-14 NOTE — H&P (Signed)
Primary EP: Dr Anice Paganini Michael Lindsey is a 69 y.o. male who presents today for afib ablation.  Since last being seen in our clinic, the patient reports doing very well. He continues to have afib despite flecainide.  Today, he denies symptoms of palpitations, chest pain, shortness of breath,  lower extremity edema, dizziness, presyncope, or syncope.  The patient is otherwise without complaint today.       Past Medical History:  Diagnosis Date  . Erectile dysfunction 11/01/2019  . Fracture    L arm @ 11; 3 ribs with shoulder separation & PTX 2003  . Gastritis 09/13/2007   EGD  . GERD (gastroesophageal reflux disease)   . Hepatitis A 1972   from exposure to septic tank which malfunctioned  . Internal hemorrhoids 09/13/2007   COLON  . Osteoporosis   . Paroxysmal atrial fibrillation (HCC)    chads2vasc score is 1        Past Surgical History:  Procedure Laterality Date  . cataract surgery  10/2014  . COLONOSCOPY  2011   Dr Sharlett Iles  . HERNIA REPAIR    . SKIN CANCER EXCISION  2011   R calf, Dr Jarome Matin  . UPPER GASTROINTESTINAL ENDOSCOPY  2003 & 2008    H pylori 2008    ROS- all systems are reviewed and negatives except as per HPI above        Current Outpatient Medications  Medication Sig Dispense Refill  . acetaminophen (TYLENOL) 325 MG tablet Take 650 mg by mouth daily as needed for mild pain, moderate pain or headache (pain).     Marland Kitchen apixaban (ELIQUIS) 5 MG TABS tablet Take 1 tablet (5 mg total) by mouth 2 (two) times daily. 180 tablet 1  . Ascorbic Acid (VITAMIN C) 100 MG tablet Take 100 mg by mouth daily.    . cholecalciferol (VITAMIN D) 1000 UNITS tablet Take 1,000 Units by mouth daily.    . Flaxseed, Linseed, (FLAXSEED OIL) 1000 MG CAPS Take by mouth as directed.     . flecainide (TAMBOCOR) 50 MG tablet Take 1 tablet (50 mg total) by mouth 2 (two) times daily. 180 tablet 3  . metoprolol tartrate (LOPRESSOR) 25 MG tablet Take 1 tablet (25  mg total) by mouth 2 (two) times daily. 180 tablet 3  . sildenafil (REVATIO) 20 MG tablet Take 3-5 tablets 1 hour prior to sexual activity 30 tablet 3  . thiamine (VITAMIN B-1) 100 MG tablet Take 100 mg by mouth daily.    . vitamin E 100 UNIT capsule Take 100 Units by mouth daily.      No current facility-administered medications for this visit.   Physical Exam: Vitals:   06/14/20 0542  BP: 126/70  Pulse: (!) 50  Resp: 16  Temp: 97.9 F (36.6 C)  TempSrc: Oral  SpO2: 99%  Weight: 77.1 kg  Height: 6' (1.829 m)    GEN- The patient is well appearing, alert and oriented x 3 today.   Head- normocephalic, atraumatic Eyes-  Sclera clear, conjunctiva pink Ears- hearing intact Oropharynx- clear Neck- supple, Lungs- Clear to ausculation bilaterally, normal work of breathing Heart- Regular rate and rhythm, no murmurs, rubs or gallops, PMI not laterally displaced GI- soft, NT, ND, + BS Extremities- no clubbing, cyanosis, or edema  MS- no significant deformity or atrophy Skin- no rash or lesion Psych- euthymic mood, full affect Neuro- strength and sensation are intact      Wt Readings from Last 3 Encounters:  05/07/20 171  lb (77.6 kg)  02/23/20 169 lb 9.6 oz (76.9 kg)  02/15/20 167 lb (75.8 kg)    EKG tracing ordered today is personally reviewed and shows sinus 50 bpm, PR 210 msec  Assessment and Plan:  1. Paroxysmal atrial fibrillation He has recurrent symptomatic afib chads2vasc score is 1.  He is on eliquis He has failed medical therapy with flecainide.  Given 1st degree AF block I do not feel that we should increase flecainide at this time. Risk, benefits, and alternatives to EP study and radiofrequency ablation for afib were also discussed in detail today. These risks include but are not limited to stroke, bleeding, vascular damage, tamponade, perforation, damage to the esophagus, lungs, and other structures, pulmonary vein stenosis, worsening renal function,  and death. The patient understands these risk and wishes to proceed.    Cardiac CT reviewed at length with patient today.  He reports compliance with eliquis without interruption.  Thompson Grayer MD, [place] 06/14/2020 7:28 AM

## 2020-06-14 NOTE — Progress Notes (Signed)
Patient and wife was given discharge instructions. Both verbalized understanding. 

## 2020-06-14 NOTE — Anesthesia Procedure Notes (Signed)
Procedure Name: Intubation Date/Time: 06/14/2020 7:48 AM Performed by: Jenne Campus, CRNA Pre-anesthesia Checklist: Patient identified, Emergency Drugs available, Suction available and Patient being monitored Patient Re-evaluated:Patient Re-evaluated prior to induction Oxygen Delivery Method: Circle System Utilized Preoxygenation: Pre-oxygenation with 100% oxygen Induction Type: IV induction Ventilation: Mask ventilation without difficulty Laryngoscope Size: Miller and 3 Grade View: Grade I Tube type: Oral Tube size: 7.5 mm Number of attempts: 1 Airway Equipment and Method: Stylet and Oral airway Placement Confirmation: ETT inserted through vocal cords under direct vision,  positive ETCO2 and breath sounds checked- equal and bilateral Secured at: 22 cm Tube secured with: Tape Dental Injury: Teeth and Oropharynx as per pre-operative assessment

## 2020-06-14 NOTE — Transfer of Care (Signed)
Immediate Anesthesia Transfer of Care Note  Patient: Michael Lindsey  Procedure(s) Performed: ATRIAL FIBRILLATION ABLATION (N/A )  Patient Location: PACU  Anesthesia Type:General  Level of Consciousness: oriented, drowsy and patient cooperative  Airway & Oxygen Therapy: Patient Spontanous Breathing and Patient connected to nasal cannula oxygen  Post-op Assessment: Report given to RN and Post -op Vital signs reviewed and stable  Post vital signs: Reviewed  Last Vitals:  Vitals Value Taken Time  BP 112/67 06/14/20 1028  Temp 36.2 C 06/14/20 1029  Pulse 81 06/14/20 1034  Resp 11 06/14/20 1034  SpO2 97 % 06/14/20 1034  Vitals shown include unvalidated device data.  Last Pain:  Vitals:   06/14/20 1029  TempSrc:   PainSc: 0-No pain         Complications: No complications documented.

## 2020-06-14 NOTE — Anesthesia Preprocedure Evaluation (Addendum)
Anesthesia Evaluation  Patient identified by MRN, date of birth, ID band Patient awake    Reviewed: Allergy & Precautions, NPO status , Patient's Chart, lab work & pertinent test results  History of Anesthesia Complications Negative for: history of anesthetic complications  Airway Mallampati: II  TM Distance: >3 FB Neck ROM: Full    Dental  (+) Teeth Intact, Dental Advisory Given, Implants   Pulmonary shortness of breath, neg recent URI, former smoker,  Covid-19 Nucleic Acid Test Results Lab Results      Component                Value               Date                      [place]              NEGATIVE            06/12/2020                [place]              Not Detected        06/01/2020                [place]              Not Detected        05/29/2020                [place]              Not Detected        09/27/2019                [place]              Not Detected        08/08/2019              breath sounds clear to auscultation       Cardiovascular + dysrhythmias  Rhythm:Regular Rate:Bradycardia  1. Left ventricular ejection fraction, by estimation, is 55%. The left  ventricle has normal function. The left ventricle has no regional wall  motion abnormalities. Left ventricular diastolic parameters were normal.  2. Right ventricular systolic function is normal. The right ventricular  size is normal. There is normal pulmonary artery systolic pressure. The  estimated right ventricular systolic pressure is 53.2 mmHg.  3. Left atrial size was mildly dilated.  4. Right atrial size was mildly dilated.  5. The mitral valve is normal in structure. Trivial mitral valve  regurgitation. No evidence of mitral stenosis.  6. The aortic valve is tricuspid. Aortic valve regurgitation is not  visualized. No aortic stenosis is present.  7. The inferior vena cava is normal in size with greater than 50%   respiratory variability, suggesting right atrial pressure of 3 mmHg.    Neuro/Psych negative neurological ROS  negative psych ROS   GI/Hepatic Neg liver ROS, GERD  Controlled,  Endo/Other  negative endocrine ROS  Renal/GU negative Renal ROS     Musculoskeletal  (+) Arthritis ,   Abdominal   Peds  Hematology negative hematology ROS (+)   Anesthesia Other Findings   Reproductive/Obstetrics                           Anesthesia Physical Anesthesia Plan  ASA: II  Anesthesia Plan: General  Post-op Pain Management:    Induction: Intravenous  PONV Risk Score and Plan: 2 and Ondansetron and Dexamethasone  Airway Management Planned: Oral ETT  Additional Equipment: None  Intra-op Plan:   Post-operative Plan: Extubation in OR  Informed Consent: I have reviewed the patients History and Physical, chart, labs and discussed the procedure including the risks, benefits and alternatives for the proposed anesthesia with the patient or authorized representative who has indicated his/her understanding and acceptance.     Dental advisory given  Plan Discussed with: CRNA and Surgeon  Anesthesia Plan Comments:         Anesthesia Quick Evaluation

## 2020-06-14 NOTE — Discharge Instructions (Signed)

## 2020-06-15 ENCOUNTER — Encounter (HOSPITAL_COMMUNITY): Payer: Self-pay | Admitting: Internal Medicine

## 2020-06-15 MED FILL — Heparin Sod (Porcine)-NaCl IV Soln 1000 Unit/500ML-0.9%: INTRAVENOUS | Qty: 1000 | Status: AC

## 2020-06-16 ENCOUNTER — Encounter: Payer: Self-pay | Admitting: Internal Medicine

## 2020-06-18 NOTE — Anesthesia Postprocedure Evaluation (Signed)
Anesthesia Post Note  Patient: FRANCK VINAL  Procedure(s) Performed: ATRIAL FIBRILLATION ABLATION (N/A )     Patient location during evaluation: PACU Anesthesia Type: General Level of consciousness: awake and alert Pain management: pain level controlled Vital Signs Assessment: post-procedure vital signs reviewed and stable Respiratory status: spontaneous breathing, nonlabored ventilation, respiratory function stable and patient connected to nasal cannula oxygen Cardiovascular status: blood pressure returned to baseline and stable Postop Assessment: no apparent nausea or vomiting Anesthetic complications: no   No complications documented.  Last Vitals:  Vitals:   06/14/20 1200 06/14/20 1300  BP: (!) 103/59 103/71  Pulse: 80 85  Resp: 18 17  Temp:    SpO2: 94% 96%    Last Pain:  Vitals:   06/15/20 1035  TempSrc:   PainSc: 0-No pain                 Edwing Figley

## 2020-06-19 DIAGNOSIS — G43B0 Ophthalmoplegic migraine, not intractable: Secondary | ICD-10-CM | POA: Diagnosis not present

## 2020-06-19 DIAGNOSIS — H524 Presbyopia: Secondary | ICD-10-CM | POA: Diagnosis not present

## 2020-06-19 DIAGNOSIS — H52222 Regular astigmatism, left eye: Secondary | ICD-10-CM | POA: Diagnosis not present

## 2020-06-19 DIAGNOSIS — H5203 Hypermetropia, bilateral: Secondary | ICD-10-CM | POA: Diagnosis not present

## 2020-06-20 DIAGNOSIS — D1801 Hemangioma of skin and subcutaneous tissue: Secondary | ICD-10-CM | POA: Diagnosis not present

## 2020-06-20 DIAGNOSIS — L814 Other melanin hyperpigmentation: Secondary | ICD-10-CM | POA: Diagnosis not present

## 2020-06-20 DIAGNOSIS — I788 Other diseases of capillaries: Secondary | ICD-10-CM | POA: Diagnosis not present

## 2020-06-20 DIAGNOSIS — D2271 Melanocytic nevi of right lower limb, including hip: Secondary | ICD-10-CM | POA: Diagnosis not present

## 2020-06-20 DIAGNOSIS — L821 Other seborrheic keratosis: Secondary | ICD-10-CM | POA: Diagnosis not present

## 2020-06-20 DIAGNOSIS — L820 Inflamed seborrheic keratosis: Secondary | ICD-10-CM | POA: Diagnosis not present

## 2020-06-20 DIAGNOSIS — D692 Other nonthrombocytopenic purpura: Secondary | ICD-10-CM | POA: Diagnosis not present

## 2020-06-20 DIAGNOSIS — Z85828 Personal history of other malignant neoplasm of skin: Secondary | ICD-10-CM | POA: Diagnosis not present

## 2020-06-20 DIAGNOSIS — D2272 Melanocytic nevi of left lower limb, including hip: Secondary | ICD-10-CM | POA: Diagnosis not present

## 2020-06-22 DIAGNOSIS — Z23 Encounter for immunization: Secondary | ICD-10-CM | POA: Diagnosis not present

## 2020-06-30 DIAGNOSIS — Z23 Encounter for immunization: Secondary | ICD-10-CM | POA: Diagnosis not present

## 2020-07-12 ENCOUNTER — Other Ambulatory Visit: Payer: Self-pay | Admitting: *Deleted

## 2020-07-12 MED ORDER — APIXABAN 5 MG PO TABS: 5.0000 mg | ORAL_TABLET | Freq: Two times a day (BID) | ORAL | 1 refills | Status: DC

## 2020-07-12 NOTE — Telephone Encounter (Signed)
Prescription refill request for Eliquis received.  Last office visit: Allred, 05/07/2020 Scr: 1.0, 06/07/2020 Age: 69 Weight: 77.1 kg   Prescription refill sent.

## 2020-07-17 ENCOUNTER — Ambulatory Visit (HOSPITAL_COMMUNITY)
Admission: RE | Admit: 2020-07-17 | Discharge: 2020-07-17 | Disposition: A | Discharge status: Home / Self Care | Payer: Medicare Other | Source: Ambulatory Visit | Attending: Nurse Practitioner | Admitting: Nurse Practitioner

## 2020-07-17 ENCOUNTER — Other Ambulatory Visit: Payer: Self-pay

## 2020-07-17 ENCOUNTER — Encounter (HOSPITAL_COMMUNITY): Payer: Self-pay | Admitting: Nurse Practitioner

## 2020-07-17 VITALS — BP 112/64 | HR 63 | Ht 72.0 in | Wt 172.2 lb

## 2020-07-17 DIAGNOSIS — Z91048 Other nonmedicinal substance allergy status: Secondary | ICD-10-CM | POA: Insufficient documentation

## 2020-07-17 DIAGNOSIS — I480 Paroxysmal atrial fibrillation: Secondary | ICD-10-CM | POA: Diagnosis not present

## 2020-07-17 DIAGNOSIS — Z7901 Long term (current) use of anticoagulants: Secondary | ICD-10-CM | POA: Diagnosis not present

## 2020-07-17 DIAGNOSIS — Z87891 Personal history of nicotine dependence: Secondary | ICD-10-CM | POA: Insufficient documentation

## 2020-07-17 DIAGNOSIS — D6869 Other thrombophilia: Secondary | ICD-10-CM

## 2020-07-17 DIAGNOSIS — K219 Gastro-esophageal reflux disease without esophagitis: Secondary | ICD-10-CM | POA: Insufficient documentation

## 2020-07-17 DIAGNOSIS — Z79899 Other long term (current) drug therapy: Secondary | ICD-10-CM | POA: Insufficient documentation

## 2020-07-17 NOTE — Progress Notes (Signed)
Primary Care Physician: Biagio Borg, MD Referring Physician: Dr. Anice Paganini Michael Lindsey is a 69 y.o. male with a h/o afib refractory to flecainide and had an ablation one month ago with Dr. Rayann Heman. He reports that he is doing very well. No afib to report. He remains on flecainide and metoprolol. He is also compliant on apixaban for a CHA2DS2VASc score of 1. No swallowing or groin issues. He is back to his normal activities without issues.   Today, he denies symptoms of palpitations, chest pain, shortness of breath, orthopnea, PND, lower extremity edema, dizziness, presyncope, syncope, or neurologic sequela. The patient is tolerating medications without difficulties and is otherwise without complaint today.   Past Medical History:  Diagnosis Date  . Erectile dysfunction 11/01/2019  . Fracture    L arm @ 10; 3 ribs with shoulder separation & PTX 2003  . Gastritis 09/13/2007   EGD  . GERD (gastroesophageal reflux disease)   . Hepatitis A 1972   from exposure to septic tank which malfunctioned  . Internal hemorrhoids 09/13/2007   COLON  . Osteoporosis   . Paroxysmal atrial fibrillation (HCC)    chads2vasc score is 1   Past Surgical History:  Procedure Laterality Date  . ATRIAL FIBRILLATION ABLATION N/A 06/14/2020   Procedure: ATRIAL FIBRILLATION ABLATION;  Surgeon: Thompson Grayer, MD;  Location: [facility];  Service: Cardiovascular;  Laterality: N/A;  . cataract surgery  10/2014  . COLONOSCOPY  2011   Dr Sharlett Iles  . HERNIA REPAIR    . SKIN CANCER EXCISION  2011   R calf, Dr Jarome Matin  . UPPER GASTROINTESTINAL ENDOSCOPY  2003 & 2008    H pylori 2008    Current Outpatient Medications  Medication Sig Dispense Refill  . acetaminophen (TYLENOL) 500 MG tablet Take 500 mg by mouth as needed for moderate pain or headache.     Marland Kitchen apixaban (ELIQUIS) 5 MG TABS tablet Take 1 tablet (5 mg total) by mouth 2 (two) times daily. 180 tablet 1  . Ascorbic Acid (VITAMIN C) 1000 MG  tablet Take 1,000 mg by mouth daily.    Marland Kitchen BLACK ELDERBERRY,BERRY-FLOWER, PO Take 1,000 mg by mouth daily.    . Carboxymethylcellulose Sodium (LUBRICANT EYE DROPS OP) Place 1 drop into both eyes 3 (three) times daily.    . cholecalciferol (VITAMIN D) 1000 UNITS tablet Take 1,000 Units by mouth daily.    Marland Kitchen dextromethorphan-guaiFENesin (MUCINEX DM) 30-600 MG 12hr tablet Take 1 tablet by mouth as needed (congestion).     . Flaxseed, Linseed, (FLAXSEED OIL) 1000 MG CAPS Take by mouth as directed.     . flecainide (TAMBOCOR) 50 MG tablet Take 1 tablet (50 mg total) by mouth 2 (two) times daily. 180 tablet 3  . metoprolol tartrate (LOPRESSOR) 25 MG tablet Take 1 tablet (25 mg total) by mouth 2 (two) times daily. 180 tablet 3  . Multiple Minerals (CALCIUM/MAGNESIUM/ZINC PO) Take 1 tablet by mouth daily.    . phenylephrine (SUDAFED PE) 10 MG TABS tablet Take 10 mg by mouth daily as needed (congestion).    Marland Kitchen Pramoxine-Camphor-Zinc Acetate (ANTI ITCH EX) Apply 1 application topically daily as needed (itching).    . sildenafil (REVATIO) 20 MG tablet Take 3-5 tablets 1 hour prior to sexual activity (Patient taking differently: Take 60-100 mg by mouth daily as needed (ED). take 1 hour prior to sexual activity) 30 tablet 3  . vitamin E 180 MG (400 UNITS) capsule Take 400 Units  by mouth daily.     No current facility-administered medications for this encounter.    Allergies  Allergen Reactions  . Chlorine     EYES WATER/NASAL CONGESTION    Social History   Socioeconomic History  . Marital status: Married    Spouse name: Not on file  . Number of children: 2  . Years of education: 49  . Highest education level: Not on file  Occupational History  . Occupation: IMMIGRATION ATTORNEY  Tobacco Use  . Smoking status: Former Smoker    Quit date: 10/06/1980    Years since quitting: 39.8  . Smokeless tobacco: Former Systems developer    Quit date: 1982  . Tobacco comment: intermittent smoker over 4 years in high school  & again in 1983 before quitting, never > 2 mos @ a time,never > 1/2 ppd  Vaping Use  . Vaping Use: Never used  Substance and Sexual Activity  . Alcohol use: No    Comment: SOCIALLY,  . Drug use: No  . Sexual activity: Not Currently  Other Topics Concern  . Not on file  Social History Narrative   GETS REG EXERCISE      Lives in [place]   Works as an Engineer, petroleum      Social Determinants of Radio broadcast assistant Strain:   . Difficulty of Paying Living Expenses: Not on file  Food Insecurity:   . Worried About Charity fundraiser in the Last Year: Not on file  . Ran Out of Food in the Last Year: Not on file  Transportation Needs:   . Lack of Transportation (Medical): Not on file  . Lack of Transportation (Non-Medical): Not on file  Physical Activity:   . Days of Exercise per Week: Not on file  . Minutes of Exercise per Session: Not on file  Stress:   . Feeling of Stress : Not on file  Social Connections:   . Frequency of Communication with Friends and Family: Not on file  . Frequency of Social Gatherings with Friends and Family: Not on file  . Attends Religious Services: Not on file  . Active Member of Clubs or Organizations: Not on file  . Attends Archivist Meetings: Not on file  . Marital Status: Not on file  Intimate Partner Violence:   . Fear of Current or Ex-Partner: Not on file  . Emotionally Abused: Not on file  . Physically Abused: Not on file  . Sexually Abused: Not on file    Family History  Problem Relation Age of Onset  . Depression Mother   . Tuberculosis Mother   . Coronary artery disease Father   . Emphysema Father   . Colon cancer Father 31  . Depression Brother   . Alcohol abuse Paternal Uncle   . Diabetes Neg Hx   . Stroke Neg Hx     ROS- All systems are reviewed and negative except as per the HPI above  Physical Exam: Vitals:   07/17/20 0850  Weight: 78.1 kg  Height: 6' (1.829 m)   Wt Readings from Last 3  Encounters:  07/17/20 78.1 kg  06/14/20 77.1 kg  05/07/20 77.6 kg    Labs: Lab Results  Component Value Date   NA 139 06/07/2020   K 4.3 06/07/2020   CL 105 06/07/2020   CO2 22 06/07/2020   GLUCOSE 82 06/07/2020   BUN 12 06/07/2020   CREATININE 1.00 06/07/2020   CALCIUM 8.8 06/07/2020   No results found  for: INR Lab Results  Component Value Date   CHOL 153 11/01/2019   HDL 54.50 11/01/2019   LDLCALC 82 11/01/2019   TRIG 85.0 11/01/2019     GEN- The patient is well appearing, alert and oriented x 3 today.   Head- normocephalic, atraumatic Eyes-  Sclera clear, conjunctiva pink Ears- hearing intact Oropharynx- clear Neck- supple, no JVP Lymph- no cervical lymphadenopathy Lungs- Clear to ausculation bilaterally, normal work of breathing Heart- Regular rate and rhythm, no murmurs, rubs or gallops, PMI not laterally displaced GI- soft, NT, ND, + BS Extremities- no clubbing, cyanosis, or edema MS- no significant deformity or atrophy Skin- no rash or lesion Psych- euthymic mood, full affect Neuro- strength and sensation are intact  EKG- Normal sinus rhythm, normal EKG     Assessment and Plan: 1. Afib S/p ablation and is doing well No afib to report Continue on flecainide and BB without change   2. CHA2DS2VASc score of 1 Continue  eliquis 5 mg bid  Reminded  not to interrupt therapy for the full 3 month healing period  F/u with Dr. Rayann Heman 12/13 afib clinic as needed   Butch Penny C. Latravis Grine, [facility] [address] [place], [place] [address] [phone]

## 2020-07-18 ENCOUNTER — Ambulatory Visit: Payer: Self-pay

## 2020-07-18 ENCOUNTER — Encounter: Payer: Self-pay | Admitting: Orthopaedic Surgery

## 2020-07-18 ENCOUNTER — Ambulatory Visit (INDEPENDENT_AMBULATORY_CARE_PROVIDER_SITE_OTHER): Payer: Medicare Other | Admitting: Orthopaedic Surgery

## 2020-07-18 VITALS — Ht 72.0 in | Wt 172.0 lb

## 2020-07-18 DIAGNOSIS — M170 Bilateral primary osteoarthritis of knee: Secondary | ICD-10-CM

## 2020-07-18 DIAGNOSIS — M25562 Pain in left knee: Secondary | ICD-10-CM | POA: Diagnosis not present

## 2020-07-18 DIAGNOSIS — G8929 Other chronic pain: Secondary | ICD-10-CM

## 2020-07-18 DIAGNOSIS — M25561 Pain in right knee: Secondary | ICD-10-CM | POA: Diagnosis not present

## 2020-07-18 NOTE — Progress Notes (Signed)
Office Visit Note   Patient: Michael Lindsey           Date of Birth: November 26, 1950           MRN: [id number] Visit Date: 07/18/2020              Requested by: Michael Borg, MD [place],  [place] [id number] PCP: Michael Borg, MD   Assessment & Plan: Visit Diagnoses:  1. Chronic pain of both knees   2. Chronic pain of right knee   3. Bilateral primary osteoarthritis of knee     Plan: Michael Lindsey is having a problem with both of his knees.  He has had a significant issue with his right knee in the past.  Has had a prior arthroscopy foot, follow-up cortisone and 2 courses of viscosupplementation giving him initially 9 and then 4 months relief.  He is having more trouble now being on his feet and even swimming with some sharp pain particularly along the medial compartment of his knee.  Films demonstrate some degenerative change.  I think it is worth obtaining an MRI scan to evaluate the extent of his arthritis.  He could be a candidate for either further cortisone or viscosupplementation or possibly even a medial compartment replacement  On the left knee he had an MRI scan a year ago revealing extensive tearing of the medial meniscus.  Over time he did well and did not really have any issues.  Now is having some discomfort along the medial compartment but not to the extent he is on the right.  He might be a candidate for knee arthroscopy to remove the meniscus.  He did talk about PRP and stem cells and I just do not think that is a good choice for him.  We will reevaluate that after the MRI scan of his right knee which is more symptomatic  Follow-Up Instructions: Return After MRI scan right knee.   Orders:  Orders Placed This Encounter  Procedures  . XR KNEE 3 VIEW LEFT  . XR KNEE 3 VIEW RIGHT  . MR Knee Right w/o contrast   No orders of the defined types were placed in this encounter.     Procedures: No procedures performed   Clinical Data: No additional  findings.   Subjective: Chief Complaint  Patient presents with  . Right Knee - Pain  . Left Knee - Pain  Patient presents today for bilateral knee pain. Right worse than the left. He said that his knees have hurt for 3-4years. They seem to be worsening over time. His pain is located more medially. No swelling, giving way, or catching. His pain is worse with climbing stairs or pushing off the way while swimming. He has had to stop playing tennis due to pain. He wants to be able to do this again. He is not taking anything for pain. He states that he has had gel injections in the past and they have helped. He has questions regarding PRP.   HPI  Review of Systems   Objective: Vital Signs: Ht 6' (1.829 m)   Wt 172 lb (78 kg)   BMI 23.33 kg/m   Physical Exam Constitutional:      Appearance: He is well-developed.  Eyes:     Pupils: Pupils are equal, round, and reactive to light.  Pulmonary:     Effort: Pulmonary effort is normal.  Skin:    General: Skin is warm and dry.  Neurological:  Mental Status: He is alert and oriented to person, place, and time.  Psychiatric:        Behavior: Behavior normal.     Ortho Exam right knee with some tenderness on the medial compartment.  May be slight varus with weightbearing.  Full extension.  Small effusion some patella crepitation.  No pain laterally.  No popliteal pain or mass.  Neurologically intact.  Left knee with very minimal medial joint pain.  Some patellar crepitation but no pain.  No pain laterally.  Full extension and flexion comparable to the right knee.  No popliteal pain or mass or calf discomfort.  Neurologically intact Specialty Comments:  No specialty comments available.  Imaging: XR KNEE 3 VIEW LEFT  Result Date: 07/18/2020 Films of the left knee were obtained in 3 projections standing.  There is less arthritic change in the left than the right knee.  Alignment might be very minimal varus.  Joint spaces are still  well-maintained.  A little irregularity in the medial compartment with joint space is still fairly wide.  Some subchondral sclerosis medially.  Minimal osteophyte formation about the patellofemoral joint with good alignment.  No acute changes or ectopic calcification.  Films are consistent with some mild medial compartment arthritis  XR KNEE 3 VIEW RIGHT  Result Date: 07/18/2020 Films of the right knee were obtained in several projections standing.  There is some narrowing of the medial joint space and irregularity of both femoral and tibial joint surfaces but there is still good joint space remaining.  No ectopic calcification.  Minimal osteophyte formation.  Lateral joint looks clear. mild changes of arthritis of the patellofemoral joint.    PMFS History: Patient Active Problem List   Diagnosis Date Noted  . Throat pain 02/15/2020  . Erectile dysfunction 11/01/2019  . Chronic anticoagulation 11/01/2019  . Pain in left knee 05/03/2019  . Bilateral primary osteoarthritis of knee 06/18/2018  . Leg pain 03/23/2018  . PAF (paroxysmal atrial fibrillation) ([place]) 11/17/2016  . Healthcare maintenance 10/20/2016  . Palpitations 09/24/2016  . Dyspnea 09/24/2016  . Eczema 11/03/2013  . Allergic rhinitis 06/01/2013  . Skin cancer 08/11/2012  . Dysphagia 12/08/2007  . Osteopenia 09/14/2007  . Esophageal reflux 04/13/2007   Past Medical History:  Diagnosis Date  . Erectile dysfunction 11/01/2019  . Fracture    L arm @ 10; 3 ribs with shoulder separation & PTX 2003  . Gastritis 09/13/2007   EGD  . GERD (gastroesophageal reflux disease)   . Hepatitis A 1972   from exposure to septic tank which malfunctioned  . Internal hemorrhoids 09/13/2007   COLON  . Osteoporosis   . Paroxysmal atrial fibrillation (HCC)    chads2vasc score is 1    Family History  Problem Relation Age of Onset  . Depression Mother   . Tuberculosis Mother   . Coronary artery disease Father   . Emphysema Father   .  Colon cancer Father 41  . Depression Brother   . Alcohol abuse Paternal Uncle   . Diabetes Neg Hx   . Stroke Neg Hx     Past Surgical History:  Procedure Laterality Date  . ATRIAL FIBRILLATION ABLATION N/A 06/14/2020   Procedure: ATRIAL FIBRILLATION ABLATION;  Surgeon: Thompson Grayer, MD;  Location: [facility];  Service: Cardiovascular;  Laterality: N/A;  . cataract surgery  10/2014  . COLONOSCOPY  2011   Dr Sharlett Iles  . HERNIA REPAIR    . SKIN CANCER EXCISION  2011   R calf,  Dr Jarome Matin  . UPPER GASTROINTESTINAL ENDOSCOPY  2003 & 2008    H pylori 2008   Social History   Occupational History  . Occupation: IMMIGRATION ATTORNEY  Tobacco Use  . Smoking status: Former Smoker    Quit date: 10/06/1980    Years since quitting: 39.8  . Smokeless tobacco: Former Systems developer    Quit date: 1982  . Tobacco comment: intermittent smoker over 4 years in high school & again in 1983 before quitting, never > 2 mos @ a time,never > 1/2 ppd  Vaping Use  . Vaping Use: Never used  Substance and Sexual Activity  . Alcohol use: No    Comment: SOCIALLY,  . Drug use: No  . Sexual activity: Not Currently

## 2020-07-25 ENCOUNTER — Other Ambulatory Visit: Payer: Medicare Other

## 2020-08-09 ENCOUNTER — Other Ambulatory Visit: Payer: Medicare Other

## 2020-08-09 ENCOUNTER — Other Ambulatory Visit: Payer: Self-pay

## 2020-08-09 ENCOUNTER — Ambulatory Visit
Admission: RE | Admit: 2020-08-09 | Discharge: 2020-08-09 | Disposition: A | Discharge status: Home / Self Care | Payer: Medicare Other | Source: Ambulatory Visit | Attending: Orthopaedic Surgery | Admitting: Orthopaedic Surgery

## 2020-08-09 DIAGNOSIS — M25561 Pain in right knee: Secondary | ICD-10-CM

## 2020-08-09 DIAGNOSIS — G8929 Other chronic pain: Secondary | ICD-10-CM

## 2020-08-15 ENCOUNTER — Telehealth: Payer: Self-pay

## 2020-08-15 ENCOUNTER — Other Ambulatory Visit: Payer: Self-pay

## 2020-08-15 ENCOUNTER — Encounter: Payer: Self-pay | Admitting: Orthopaedic Surgery

## 2020-08-15 ENCOUNTER — Ambulatory Visit (INDEPENDENT_AMBULATORY_CARE_PROVIDER_SITE_OTHER): Payer: Medicare Other | Admitting: Orthopaedic Surgery

## 2020-08-15 VITALS — Ht 72.0 in | Wt 172.0 lb

## 2020-08-15 DIAGNOSIS — M170 Bilateral primary osteoarthritis of knee: Secondary | ICD-10-CM

## 2020-08-15 NOTE — Telephone Encounter (Signed)
Please precert for right knee visco injections. This is Dr.Whitfield's patient. Thanks!  

## 2020-08-15 NOTE — Progress Notes (Signed)
Office Visit Note   Patient: Michael Lindsey           Date of Birth: 05/05/1951           MRN: [id number] Visit Date: 08/15/2020              Requested by: Biagio Borg, MD [place],  [place] [id number] PCP: Biagio Borg, MD   Assessment & Plan: Visit Diagnoses:  1. Bilateral primary osteoarthritis of knee     Plan: Michael Lindsey had an MRI scan of his right knee demonstrating areas of full-thickness cartilage loss in the medial compartment.  He has had a prior medial meniscectomy.  The patellofemoral joint and the lateral compartment appear to be relatively clear of pathology.  Given all of the above I think of he were to consider surgery he might be a good candidate for a unilateral replacement.  I will asked Dr. Ninfa Linden to review his films and MRI scan.  In the meantime he like to try another course of viscosupplementation.  We will pre-CERT this  Follow-Up Instructions: Return Pre-CERT Visco supplementation.   Orders:  No orders of the defined types were placed in this encounter.  No orders of the defined types were placed in this encounter.     Procedures: No procedures performed   Clinical Data: No additional findings.   Subjective: Chief Complaint  Patient presents with  . Right Knee - Follow-up    MRI review  Patient presents today for follow up on his right knee. He had an MRI performed on 08/09/2020. He is here today for those results. No changes since his last visit.   HPI  Review of Systems   Objective: Vital Signs: Ht 6' (1.829 m)   Wt 172 lb (78 kg)   BMI 23.33 kg/m   Physical Exam Constitutional:      Appearance: He is well-developed.  Eyes:     Pupils: Pupils are equal, round, and reactive to light.  Pulmonary:     Effort: Pulmonary effort is normal.  Skin:    General: Skin is warm and dry.  Neurological:     Mental Status: He is alert and oriented to person, place, and time.  Psychiatric:        Behavior: Behavior normal.      Ortho Exam right knee was not hot red warm or swollen.  No effusion.  No instability.  Increased varus with weightbearing.  Appears to have full extension flexed over 105 degrees.  Predominant medial joint pain.  This range of motion of both hips.  Straight leg raise negative Specialty Comments:  No specialty comments available.  Imaging: No results found.   PMFS History: Patient Active Problem List   Diagnosis Date Noted  . Throat pain 02/15/2020  . Erectile dysfunction 11/01/2019  . Chronic anticoagulation 11/01/2019  . Pain in left knee 05/03/2019  . Bilateral primary osteoarthritis of knee 06/18/2018  . Leg pain 03/23/2018  . PAF (paroxysmal atrial fibrillation) ([place]) 11/17/2016  . Healthcare maintenance 10/20/2016  . Palpitations 09/24/2016  . Dyspnea 09/24/2016  . Eczema 11/03/2013  . Allergic rhinitis 06/01/2013  . Skin cancer 08/11/2012  . Dysphagia 12/08/2007  . Osteopenia 09/14/2007  . Esophageal reflux 04/13/2007   Past Medical History:  Diagnosis Date  . Erectile dysfunction 11/01/2019  . Fracture    L arm @ 10; 3 ribs with shoulder separation & PTX 2003  . Gastritis 09/13/2007   EGD  . GERD (gastroesophageal  reflux disease)   . Hepatitis A 1972   from exposure to septic tank which malfunctioned  . Internal hemorrhoids 09/13/2007   COLON  . Osteoporosis   . Paroxysmal atrial fibrillation (HCC)    chads2vasc score is 1    Family History  Problem Relation Age of Onset  . Depression Mother   . Tuberculosis Mother   . Coronary artery disease Father   . Emphysema Father   . Colon cancer Father 15  . Depression Brother   . Alcohol abuse Paternal Uncle   . Diabetes Neg Hx   . Stroke Neg Hx     Past Surgical History:  Procedure Laterality Date  . ATRIAL FIBRILLATION ABLATION N/A 06/14/2020   Procedure: ATRIAL FIBRILLATION ABLATION;  Surgeon: Thompson Grayer, MD;  Location: [facility];  Service: Cardiovascular;  Laterality: N/A;  . cataract  surgery  10/2014  . COLONOSCOPY  2011   Dr Sharlett Iles  . HERNIA REPAIR    . SKIN CANCER EXCISION  2011   R calf, Dr Jarome Matin  . UPPER GASTROINTESTINAL ENDOSCOPY  2003 & 2008    H pylori 2008   Social History   Occupational History  . Occupation: IMMIGRATION ATTORNEY  Tobacco Use  . Smoking status: Former Smoker    Quit date: 10/06/1980    Years since quitting: 39.8  . Smokeless tobacco: Former Systems developer    Quit date: 1982  . Tobacco comment: intermittent smoker over 4 years in high school & again in 1983 before quitting, never > 2 mos @ a time,never > 1/2 ppd  Vaping Use  . Vaping Use: Never used  Substance and Sexual Activity  . Alcohol use: No    Comment: SOCIALLY,  . Drug use: No  . Sexual activity: Not Currently

## 2020-08-17 NOTE — Telephone Encounter (Signed)
Last bilateral synvisc one was done in 2019  Submitted for VOB for synvisc one-Bilateral knee

## 2020-08-20 ENCOUNTER — Telehealth: Payer: Self-pay

## 2020-08-20 NOTE — Telephone Encounter (Signed)
Are you okay with Synvisc One? Patient received this last time.

## 2020-08-20 NOTE — Telephone Encounter (Signed)
Approved for Synvisc One-Bilateral knee Buy and Bill Dr. Durward Fortes Covered @ 100%  2nd insurance to pick up cost after medicare No copay No prior auth required    Pt last had synvisc one for bilateral knee last time. Before I call and schedule the pt can you please make sure that Dr. Durward Fortes is ok with doing the 1 series again? Also ordered for just right knee. I went ahead a verified for both since both were done last time.

## 2020-08-20 NOTE — Telephone Encounter (Signed)
Please see below.

## 2020-08-20 NOTE — Telephone Encounter (Signed)
Please call Michael Lindsey he is OK with the one injection then I am OK with it as well.

## 2020-08-21 NOTE — Telephone Encounter (Signed)
Pt was ok with this. Schedule pt for monday

## 2020-08-27 ENCOUNTER — Ambulatory Visit (INDEPENDENT_AMBULATORY_CARE_PROVIDER_SITE_OTHER): Payer: Medicare Other | Admitting: Orthopaedic Surgery

## 2020-08-27 ENCOUNTER — Other Ambulatory Visit: Payer: Self-pay

## 2020-08-27 ENCOUNTER — Encounter: Payer: Self-pay | Admitting: Orthopaedic Surgery

## 2020-08-27 VITALS — Ht 72.0 in | Wt 172.0 lb

## 2020-08-27 DIAGNOSIS — M170 Bilateral primary osteoarthritis of knee: Secondary | ICD-10-CM

## 2020-08-27 DIAGNOSIS — M1711 Unilateral primary osteoarthritis, right knee: Secondary | ICD-10-CM | POA: Diagnosis not present

## 2020-08-27 MED ORDER — HYLAN G-F 20 48 MG/6ML IX SOSY
48.0000 mg | PREFILLED_SYRINGE | INTRA_ARTICULAR | Status: AC | PRN
  Administered 2020-08-27: 48 mg via INTRA_ARTICULAR

## 2020-08-27 MED ORDER — LIDOCAINE HCL 1 % IJ SOLN
2.0000 mL | INTRAMUSCULAR | Status: AC | PRN
  Administered 2020-08-27: 2 mL

## 2020-08-27 NOTE — Progress Notes (Signed)
Office Visit Note   Patient: Michael Lindsey           Date of Birth: 1951-01-17           MRN: [id number] Visit Date: 08/27/2020              Requested by: Biagio Borg, MD [address] [place],  [place] [address] PCP: Biagio Borg, MD   Assessment & Plan: Visit Diagnoses:  1. Bilateral primary osteoarthritis of knee     Plan: [name] has been approved for Synvisc 1 viscosupplementation right knee.  Will inject today and monitor response  Follow-Up Instructions: Return if symptoms worsen or fail to improve.   Orders:  Orders Placed This Encounter  Procedures  . Large Joint Inj: R knee   No orders of the defined types were placed in this encounter.     Procedures: Large Joint Inj: R knee on 08/27/2020 9:28 AM Indications: pain and joint swelling Details: 25 G 1.5 in needle  Arthrogram: No  Medications: 2 mL lidocaine 1 %; 48 mg Hylan 48 MG/6ML Outcome: tolerated well, no immediate complications Procedure, treatment alternatives, risks and benefits explained, specific risks discussed. Consent was given by the patient. Immediately prior to procedure a time out was called to verify the correct patient, procedure, equipment, support staff and site/side marked as required. Patient was prepped and draped in the usual sterile fashion.       Clinical Data: No additional findings.   Subjective: Chief Complaint  Patient presents with  . Right Knee - Follow-up    Synvisc One  Patient presents today for Synvisc One in his right knee.  No change in symptoms  HPI  Review of Systems   Objective: Vital Signs: Ht 6' (1.829 m)   Wt 172 lb (78 kg)   BMI 23.33 kg/m   Physical Exam  Ortho Exam right knee was not hot red warm or swollen.  No effusion.  Mostly medial joint pain.  No patella crepitation or pain with compression.  Full extension flexed over 105 degrees without instability  Specialty Comments:  No specialty comments available.  Imaging: No  results found.   PMFS History: Patient Active Problem List   Diagnosis Date Noted  . Throat pain 02/15/2020  . Erectile dysfunction 11/01/2019  . Chronic anticoagulation 11/01/2019  . Pain in left knee 05/03/2019  . Bilateral primary osteoarthritis of knee 06/18/2018  . Leg pain 03/23/2018  . PAF (paroxysmal atrial fibrillation) ([place]) 11/17/2016  . Healthcare maintenance 10/20/2016  . Palpitations 09/24/2016  . Dyspnea 09/24/2016  . Eczema 11/03/2013  . Allergic rhinitis 06/01/2013  . Skin cancer 08/11/2012  . Dysphagia 12/08/2007  . Osteopenia 09/14/2007  . Esophageal reflux 04/13/2007   Past Medical History:  Diagnosis Date  . Erectile dysfunction 11/01/2019  . Fracture    L arm @ 11; 3 ribs with shoulder separation & PTX 2003  . Gastritis 09/13/2007   EGD  . GERD (gastroesophageal reflux disease)   . Hepatitis A 1972   from exposure to septic tank which malfunctioned  . Internal hemorrhoids 09/13/2007   COLON  . Osteoporosis   . Paroxysmal atrial fibrillation (H[facility])    chads2vasc score is 1    Family History  Problem Relation Age of Onset  . Depression Mother   . Tuberculosis Mother   . Coronary artery disease Father   . Emphysema Father   . Colon cancer Father 7  . Depression Brother   . Alcohol  abuse Paternal Uncle   . Diabetes Neg Hx   . Stroke Neg Hx     Past Surgical History:  Procedure Laterality Date  . ATRIAL FIBRILLATION ABLATION N/A 06/14/2020   Procedure: ATRIAL FIBRILLATION ABLATION;  Surgeon: Thompson Grayer, MD;  Location: [facility];  Service: Cardiovascular;  Laterality: N/A;  . cataract surgery  10/2014  . COLONOSCOPY  2011   Dr Sharlett Iles  . HERNIA REPAIR    . SKIN CANCER EXCISION  2011   R calf, Dr Jarome Matin  . UPPER GASTROINTESTINAL ENDOSCOPY  2003 & 2008    H pylori 2008   Social History   Occupational History  . Occupation: IMMIGRATION ATTORNEY  Tobacco Use  . Smoking status: Former Smoker    Quit date: 10/06/1980     Years since quitting: 39.9  . Smokeless tobacco: Former Systems developer    Quit date: 1982  . Tobacco comment: intermittent smoker over 4 years in high school & again in 1983 before quitting, never > 2 mos @ a time,never > 1/2 ppd  Vaping Use  . Vaping Use: Never used  Substance and Sexual Activity  . Alcohol use: No    Comment: SOCIALLY,  . Drug use: No  . Sexual activity: Not Currently

## 2020-09-17 ENCOUNTER — Other Ambulatory Visit: Payer: Self-pay

## 2020-09-17 ENCOUNTER — Ambulatory Visit (INDEPENDENT_AMBULATORY_CARE_PROVIDER_SITE_OTHER): Payer: Medicare Other | Admitting: Internal Medicine

## 2020-09-17 ENCOUNTER — Encounter: Payer: Self-pay | Admitting: Internal Medicine

## 2020-09-17 VITALS — BP 110/72 | HR 57 | Ht 72.0 in | Wt 172.8 lb

## 2020-09-17 DIAGNOSIS — I480 Paroxysmal atrial fibrillation: Secondary | ICD-10-CM | POA: Diagnosis not present

## 2020-09-17 MED ORDER — METOPROLOL TARTRATE 25 MG PO TABS: ORAL_TABLET | ORAL | 0 refills | Status: DC

## 2020-09-17 NOTE — Progress Notes (Signed)
PCP: Michael Borg, MD    Michael Lindsey is a 69 y.o. male who presents today for routine electrophysiology followup.  Since his recent afib ablation, the patient reports doing very well.  he denies procedure related complications and is pleased with the results of the procedure.  Today, he denies symptoms of palpitations, chest pain, shortness of breath,  lower extremity edema, dizziness, presyncope, or syncope.  The patient is otherwise without complaint today.   Past Medical History:  Diagnosis Date  . Erectile dysfunction 11/01/2019  . Fracture    L arm @ 11; 3 ribs with shoulder separation & PTX 2003  . Gastritis 09/13/2007   EGD  . GERD (gastroesophageal reflux disease)   . Hepatitis A 1972   from exposure to septic tank which malfunctioned  . Internal hemorrhoids 09/13/2007   COLON  . Osteoporosis   . Paroxysmal atrial fibrillation (H[facility])    chads2vasc score is 1   Past Surgical History:  Procedure Laterality Date  . ATRIAL FIBRILLATION ABLATION N/A 06/14/2020   Procedure: ATRIAL FIBRILLATION ABLATION;  Surgeon: Michael Grayer, MD;  Location: [facility];  Service: Cardiovascular;  Laterality: N/A;  . cataract surgery  10/2014  . COLONOSCOPY  2011   Dr Michael Lindsey  . HERNIA REPAIR    . SKIN CANCER EXCISION  2011   R calf, Dr Michael Lindsey  . UPPER GASTROINTESTINAL ENDOSCOPY  2003 & 2008    H pylori 2008    ROS- all systems are personally reviewed and negatives except as per HPI above  Current Outpatient Medications  Medication Sig Dispense Refill  . acetaminophen (TYLENOL) 500 MG tablet Take 500 mg by mouth as needed for moderate pain or headache.     Marland Kitchen apixaban (ELIQUIS) 5 MG TABS tablet Take 1 tablet (5 mg total) by mouth 2 (two) times daily. 180 tablet 1  . Ascorbic Acid (VITAMIN C) 1000 MG tablet Take 1,000 mg by mouth daily.    Marland Kitchen BLACK ELDERBERRY,BERRY-FLOWER, PO Take 1,000 mg by mouth daily.    . Carboxymethylcellulose Sodium (LUBRICANT EYE DROPS OP) Place 1  drop into both eyes 3 (three) times daily.    . cholecalciferol (VITAMIN D) 1000 UNITS tablet Take 1,000 Units by mouth daily.    Marland Kitchen dextromethorphan-guaiFENesin (MUCINEX DM) 30-600 MG 12hr tablet Take 1 tablet by mouth as needed (congestion).     . Flaxseed, Linseed, (FLAXSEED OIL) 1000 MG CAPS Take by mouth as directed.     . flecainide (TAMBOCOR) 50 MG tablet Take 1 tablet (50 mg total) by mouth 2 (two) times daily. 180 tablet 3  . metoprolol tartrate (LOPRESSOR) 25 MG tablet Take 1 tablet (25 mg total) by mouth 2 (two) times daily. 180 tablet 3  . Multiple Minerals (CALCIUM/MAGNESIUM/ZINC PO) Take 1 tablet by mouth daily.    . phenylephrine (SUDAFED PE) 10 MG TABS tablet Take 10 mg by mouth daily as needed (congestion).    Marland Kitchen Pramoxine-Camphor-Zinc Acetate (ANTI ITCH EX) Apply 1 application topically daily as needed (itching).    . sildenafil (REVATIO) 20 MG tablet Take 3-5 tablets 1 hour prior to sexual activity (Patient taking differently: Take 60-100 mg by mouth daily as needed (ED). take 1 hour prior to sexual activity) 30 tablet 3  . vitamin E 180 MG (400 UNITS) capsule Take 400 Units by mouth daily.     No current facility-administered medications for this visit.    Physical Exam: Vitals:   09/17/20 1237  BP: 110/72  Pulse: (!) 57  SpO2: 99%  Weight: 172 lb 12.8 oz (78.4 kg)  Height: 6' (1.829 m)    GEN- The patient is well appearing, alert and oriented x 3 today.   Head- normocephalic, atraumatic Eyes-  Sclera clear, conjunctiva pink Ears- hearing intact Oropharynx- clear Lungs- Clear to ausculation bilaterally, normal work of breathing Heart- Regular rate and rhythm, no murmurs, rubs or gallops, PMI not laterally displaced GI- soft, NT, ND, + BS Extremities- no clubbing, cyanosis, or edema  EKG tracing ordered today is personally reviewed and shows sinus rhythm 57 bpm, PR 202 msec  Assessment and Plan:  1. Paroxysmal atrial fibrillation Doing well s/p  ablation chads2vasc score is 1 We discussed pros and cons of anticoagulation at length today. He wishes to stop eliquis Stop flecainide In 6 weeks, reduce metoprolol to 12.5mg  BID,  In 9 weeks stop metoprolol    Return to see me in 3 months  Michael Grayer MD, [facility] 09/17/2020 12:46 PM

## 2020-09-17 NOTE — Patient Instructions (Addendum)
Medication Instructions:  Stop eliquis Stop flecainide In 6 weeks reduce your metoprolol to 12.5 mg two times daily, then in 9 weeks stop metoprolol   *If you need a refill on your cardiac medications before your next appointment, please call your pharmacy*  Lab Work: None ordered.  If you have labs (blood work) drawn today and your tests are completely normal, you will receive your results only by: Marland Kitchen MyChart Message (if you have MyChart) OR . A paper copy in the mail If you have any lab test that is abnormal or we need to change your treatment, we will call you to review the results.  Testing/Procedures: None ordered.  Follow-Up: At [facility], you and your health needs are our priority.  As part of our continuing mission to provide you with exceptional heart care, we have created designated Provider Care Teams.  These Care Teams include your primary Cardiologist (physician) and Advanced Practice Providers (APPs -  Physician Assistants and Nurse Practitioners) who all work together to provide you with the care you need, when you need it.  We recommend signing up for the patient portal called "MyChart".  Sign up information is provided on this After Visit Summary.  MyChart is used to connect with patients for Virtual Visits (Telemedicine).  Patients are able to view lab/test results, encounter notes, upcoming appointments, etc.  Non-urgent messages can be sent to your provider as well.   To learn more about what you can do with MyChart, go to [email or web address].    Your next appointment:   Your physician wants you to follow-up in: 12/13/20 at 8:45 am with Dr. Rayann Heman.    Other Instructions: Afib clinic phone number [phone]

## 2020-10-29 IMAGING — MR MR KNEE*L* W/O CM
7 series · 37 of 40 positions shown · non-contrast
Comparison: X-ray 05/03/2019

CLINICAL DATA: Left knee pain

EXAM:
MRI OF THE LEFT KNEE WITHOUT CONTRAST
TECHNIQUE: Multiplanar, multisequence MR imaging of the knee was performed. No
intravenous contrast was administered.

[Series 6: T2 fat-sat · axial · left · 4.0mm · 0.50mm/px · z∈[-91,+59]mm · 6 of 36 slices shown (1 of 3)]
[im 1/36]
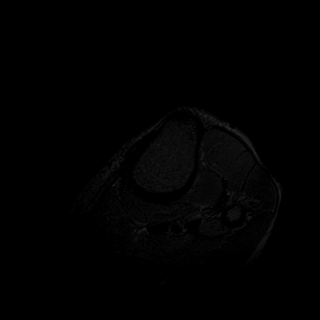
[im 8/36]
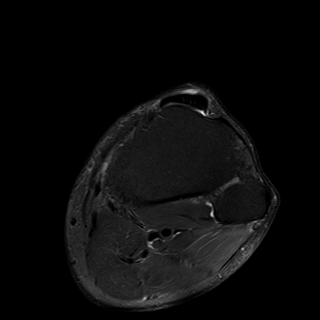
[im 15/36]
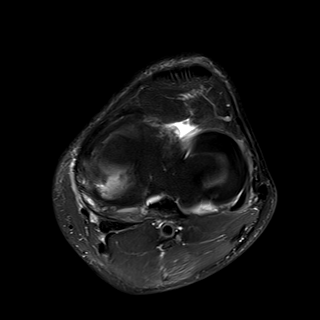
[im 22/36]
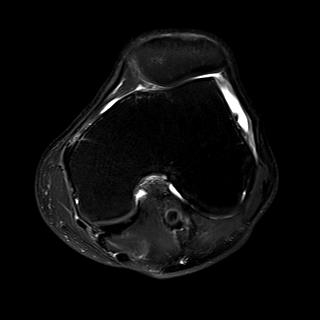
[im 29/36]
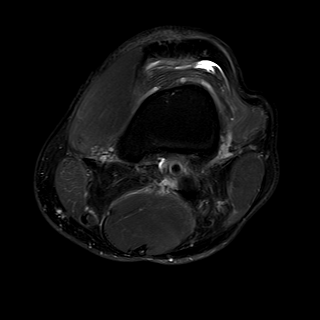
[im 36/36]
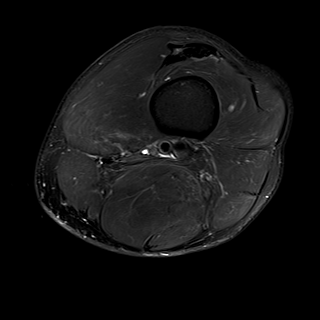

[Series 7: T2 fat-sat · coronal · left · 4.0mm · 0.39mm/px · 6 of 28 slices shown (2 of 3)]
[im 1/28]
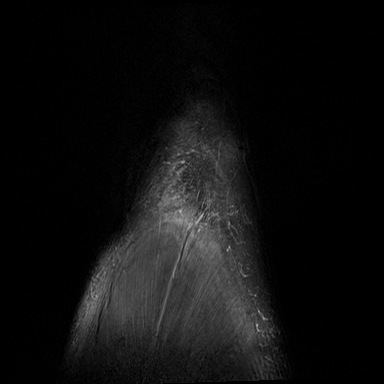
[im 6/28]
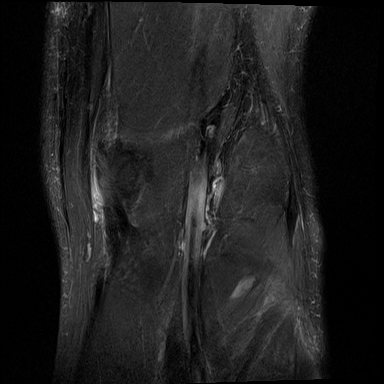
[im 11/28]
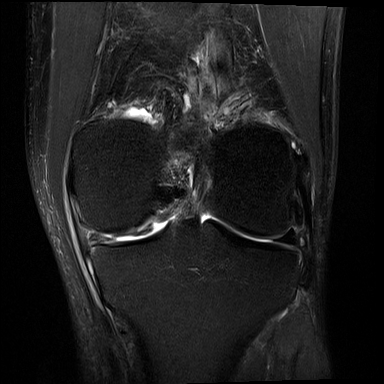
[im 17/28]
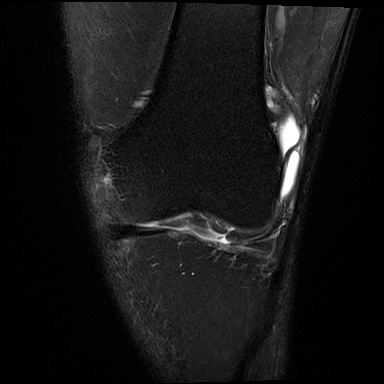
[im 22/28]
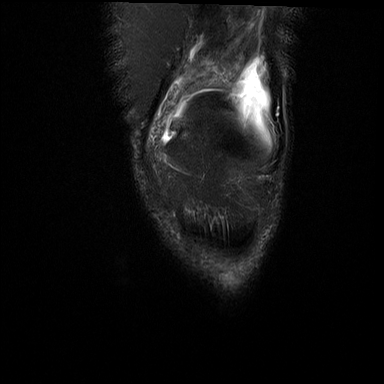
[im 28/28]
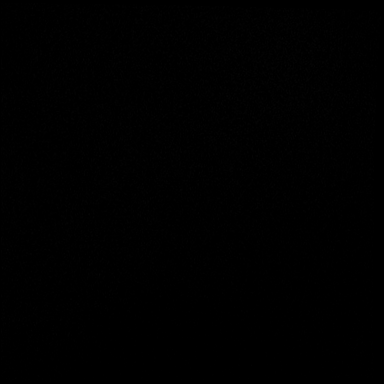

[Series 8: T1 · coronal · left · 4.0mm · 0.39mm/px · 3 of 28 slices shown]
[im 1/28]
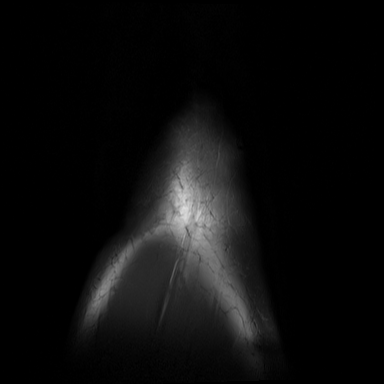
[im 6/28]
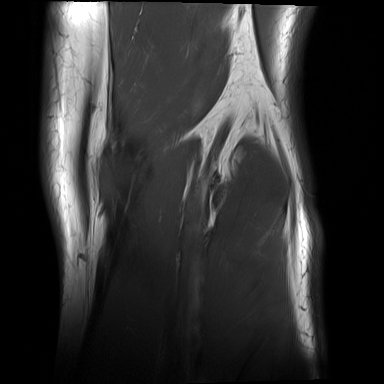
[im 11/28]
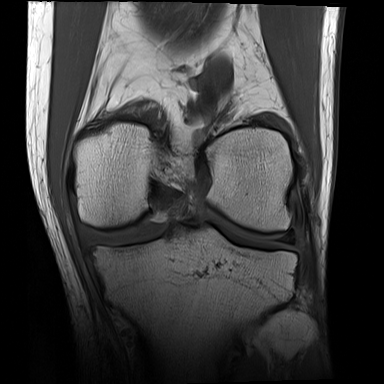

[Series 9: PD fat-sat · coronal · left · 3.0mm · 0.47mm/px · 6 of 28 slices shown (1 of 2)]
[im 1/28]
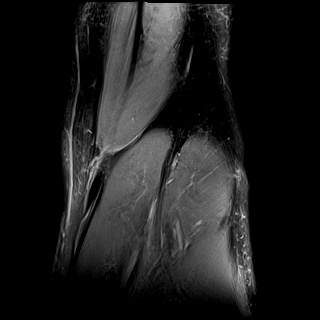
[im 6/28]
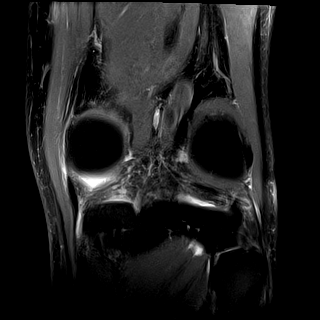
[im 11/28]
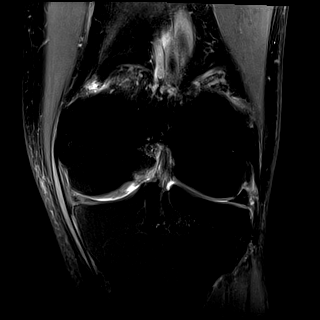
[im 17/28]
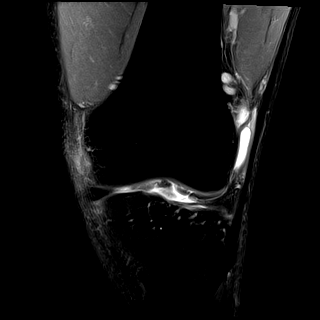
[im 22/28]
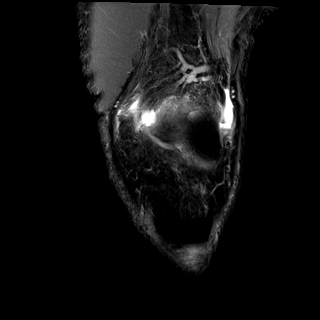
[im 28/28]
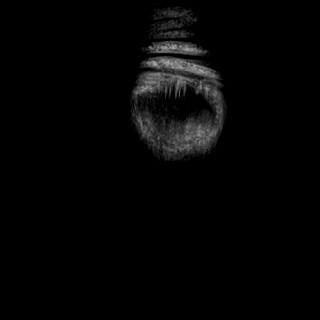

[Series 10: PD fat-sat · sagittal · left · 3.0mm · 0.39mm/px · 6 of 27 slices shown (2 of 2)]
[im 1/27]
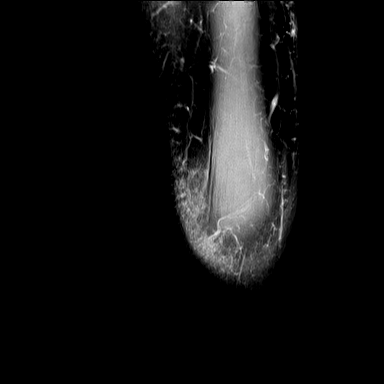
[im 6/27]
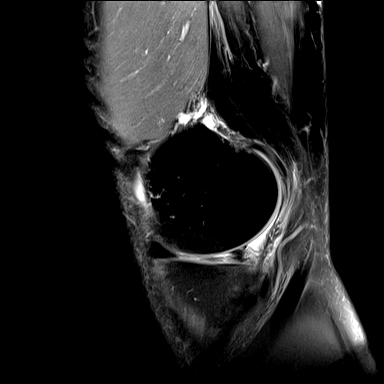
[im 11/27]
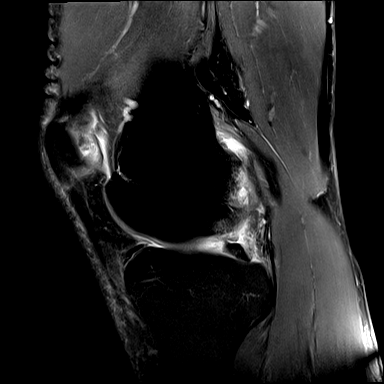
[im 16/27]
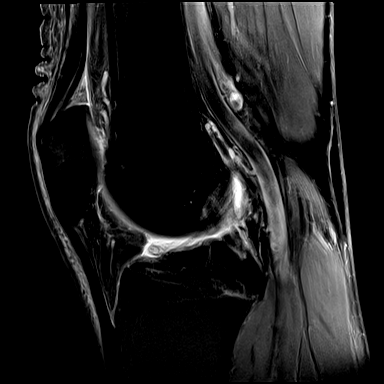
[im 21/27]
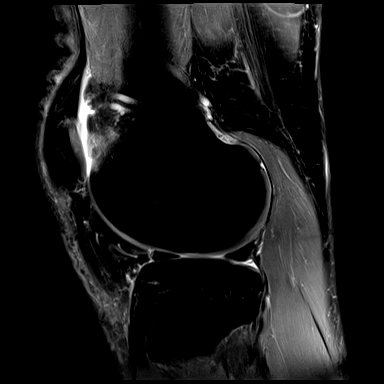
[im 27/27]
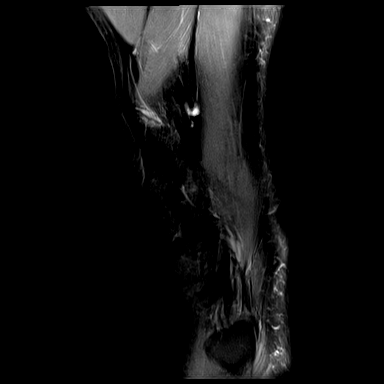

[Series 11: T2 fat-sat · sagittal · left · 3.0mm · 0.39mm/px · 6 of 27 slices shown (3 of 3)]
[im 1/27]
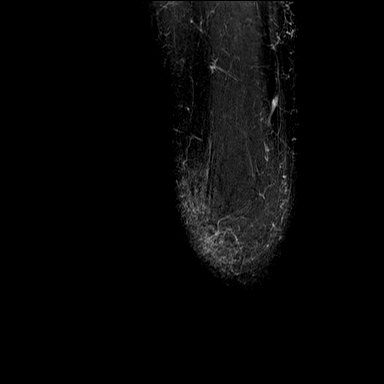
[im 6/27]
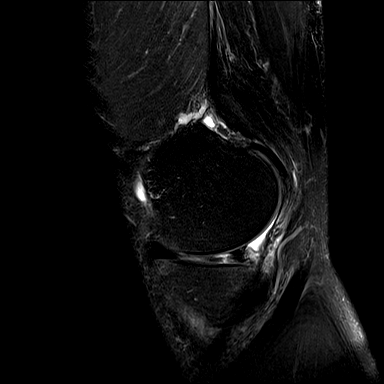
[im 11/27]
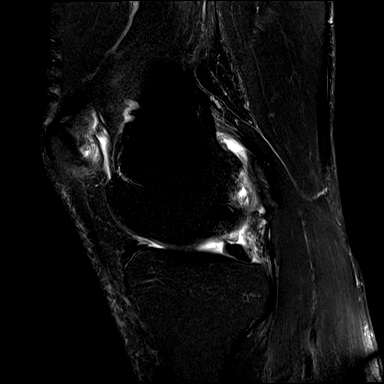
[im 16/27]
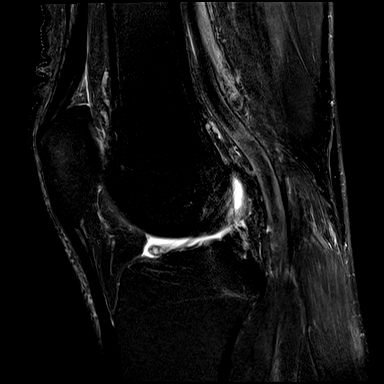
[im 21/27]
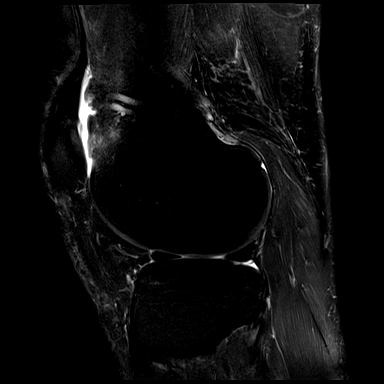
[im 27/27]
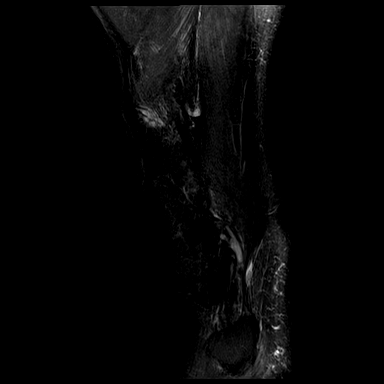

[Series 12: PD · oblique · left · 1.5mm · 0.44mm/px · 4 of 21 slices shown]
[im 1/21]
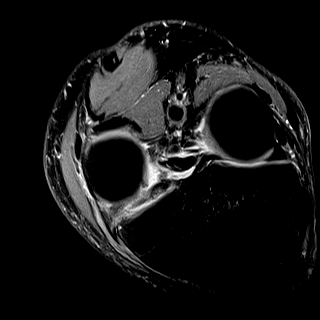
[im 7/21]
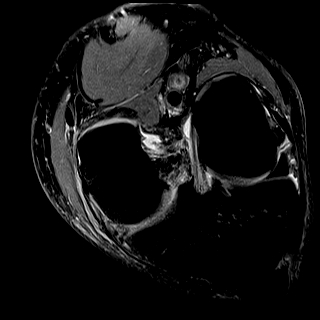
[im 14/21]
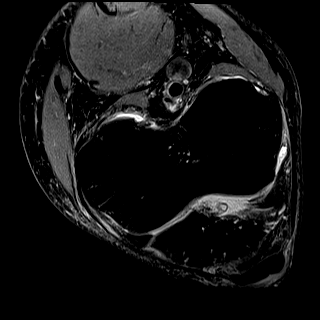
[im 21/21]
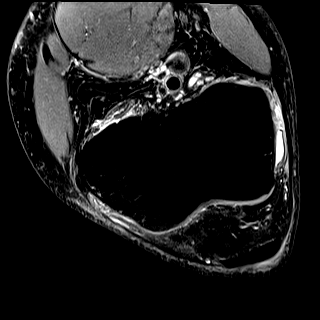

[37 of 40 positions shown; findings below may reference images not displayed]

FINDINGS: MENISCI

Medial meniscus: Extensive complex tearing the posterior horn body
segments of medial meniscus with prominent radial component. There
is a probable displaced flap tissue within the joint space, likely
represent Balvina Nunes type tear (series 6, image 22).

Lateral meniscus:  Intact.

LIGAMENTS

Cruciates:  Intact ACL and PCL.

Collaterals: Intact MCL with extensive periligamentous edema. Intact
lateral collateral ligament complex.

CARTILAGE

Patellofemoral: Full-thickness cartilage fissuring of the superior
aspect of the medial patellar facet (series 6, image 13). Surface
irregularity with partial-thickness fissuring at the patellar apex.
No discrete trochlear defect.

Medial: Surface irregularity of the weight-bearing medial femoral
condyle (series 11, image 9).

Lateral:  No chondral defect.

Joint:  Small joint effusion.  Normal Hoffa's fat.

Popliteal Fossa:  No Baker's cyst.  Mild popliteus tendinosis.

Extensor Mechanism: Mild quadriceps and patellar tendinosis without
tear.

Bones: No focal marrow signal abnormality. No fracture or
dislocation.

Other: None.
IMPRESSION: 1. Extensive, complex tearing of the posterior horn and body
segments of the medial meniscus with probable Laxmi type tear
morphology.
2. Intact MCL with extensive periligamentous edema which may reflect
low-grade MCL sprain versus reactive changes from adjacent medial
compartment pathology.
3. High-grade cartilage irregularities of the patellofemoral
compartment.
4. Mild quadriceps and patellar tendinosis.

## 2020-11-01 ENCOUNTER — Ambulatory Visit: Payer: Medicare Other | Admitting: Internal Medicine

## 2020-11-12 ENCOUNTER — Ambulatory Visit (INDEPENDENT_AMBULATORY_CARE_PROVIDER_SITE_OTHER): Payer: Medicare Other | Admitting: Internal Medicine

## 2020-11-12 ENCOUNTER — Other Ambulatory Visit: Payer: Self-pay

## 2020-11-12 ENCOUNTER — Encounter: Payer: Self-pay | Admitting: Internal Medicine

## 2020-11-12 VITALS — BP 138/78 | HR 82 | Temp 98.3°F | Ht 72.0 in | Wt 171.0 lb

## 2020-11-12 DIAGNOSIS — Z0000 Encounter for general adult medical examination without abnormal findings: Secondary | ICD-10-CM | POA: Diagnosis not present

## 2020-11-12 DIAGNOSIS — E559 Vitamin D deficiency, unspecified: Secondary | ICD-10-CM

## 2020-11-12 DIAGNOSIS — N320 Bladder-neck obstruction: Secondary | ICD-10-CM

## 2020-11-12 DIAGNOSIS — E7849 Other hyperlipidemia: Secondary | ICD-10-CM

## 2020-11-12 NOTE — Assessment & Plan Note (Signed)
Age and sex appropriate education and counseling updated with regular exercise and diet Referrals for preventative services - none needed, but is interested in cardiac ct score self pay - will order Immunizations addressed - none needed Smoking counseling  - none needed Evidence for depression or other mood disorder - none significant Most recent labs reviewed. I have personally reviewed and have noted: 1) the patient's medical and social history 2) The patient's current medications and supplements 3) The patient's height, weight, and BMI have been recorded in the chart

## 2020-11-12 NOTE — Progress Notes (Signed)
Patient ID: Michael Lindsey, male   DOB: 1951/02/27, 70 y.o.   MRN: [id number]       Chief Complaint:: wellness exam and low vit d       HPI:  Michael Lindsey is a 70 y.o. male here for wellness exam   Wt Readings from Last 3 Encounters:  11/12/20 171 lb (77.6 kg)  09/17/20 172 lb 12.8 oz (78.4 kg)  08/27/20 172 lb (78 kg)   BP Readings from Last 3 Encounters:  11/12/20 138/78  09/17/20 110/72  07/17/20 112/64   Immunization History  Administered Date(s) Administered  . Fluad Quad(high Dose 65+) 06/12/2019  . Influenza Split 07/10/2011  . Influenza Whole 07/06/2002, 08/06/2012, 06/20/2013  . Influenza, High Dose Seasonal PF 08/09/2017, 07/15/2018  . Influenza-Unspecified 08/05/2015, 09/10/2020  . PFIZER(Purple Top)SARS-COV-2 Vaccination 10/28/2019, 11/18/2019, 09/10/2020  . Pneumococcal Conjugate-13 10/20/2016  . Pneumococcal Polysaccharide-23 10/28/2017  . Td 05/23/2002  . Tdap 08/12/2013, 10/08/2014  . Zoster 07/06/2012  . Zoster Recombinat (Shingrix) 10/30/2018, 02/19/2019   There are no preventive care reminders to display for this patient.       Also, no other new complaints.  Pt denies chest pain, increased sob or doe, wheezing, orthopnea, PND, increased LE swelling, palpitations, dizziness or syncope.  Pt denies new neurological symptoms such as new headache, or facial or extremity weakness or numbness   Pt denies polydipsia, polyuria,  Pt is interested in cardiac ct score.  Past Medical History:  Diagnosis Date  . Erectile dysfunction 11/01/2019  . Fracture    L arm @ 11; 3 ribs with shoulder separation & PTX 2003  . Gastritis 09/13/2007   EGD  . GERD (gastroesophageal reflux disease)   . Hepatitis A 1972   from exposure to septic tank which malfunctioned  . Internal hemorrhoids 09/13/2007   COLON  . Osteoporosis   . Paroxysmal atrial fibrillation (H[facility])    chads2vasc score is 1   Past Surgical History:  Procedure Laterality Date  . ATRIAL FIBRILLATION  ABLATION N/A 06/14/2020   Procedure: ATRIAL FIBRILLATION ABLATION;  Surgeon: Thompson Grayer, MD;  Location: [facility];  Service: Cardiovascular;  Laterality: N/A;  . cataract surgery  10/2014  . COLONOSCOPY  2011   Dr Sharlett Iles  . HERNIA REPAIR    . SKIN CANCER EXCISION  2011   R calf, Dr Jarome Matin  . UPPER GASTROINTESTINAL ENDOSCOPY  2003 & 2008    H pylori 2008    reports that he quit smoking about 40 years ago. He quit smokeless tobacco use about 40 years ago. He reports that he does not drink alcohol and does not use drugs. family history includes Alcohol abuse in his paternal uncle; Colon cancer (age of onset: 63) in his father; Coronary artery disease in his father; Depression in his brother and mother; Emphysema in his father; Tuberculosis in his mother. Allergies  Allergen Reactions  . Chlorine     EYES WATER/NASAL CONGESTION   Current Outpatient Medications on File Prior to Visit  Medication Sig Dispense Refill  . acetaminophen (TYLENOL) 500 MG tablet Take 500 mg by mouth as needed for moderate pain or headache.     . Ascorbic Acid (VITAMIN C) 1000 MG tablet Take 1,000 mg by mouth daily.    Marland Kitchen BLACK ELDERBERRY,BERRY-FLOWER, PO Take 1,000 mg by mouth daily.    . Carboxymethylcellulose Sodium (LUBRICANT EYE DROPS OP) Place 1 drop into both eyes 3 (three) times daily.    . cholecalciferol (VITAMIN D) 1000  UNITS tablet Take 1,000 Units by mouth daily.    Marland Kitchen dextromethorphan-guaiFENesin (MUCINEX DM) 30-600 MG 12hr tablet Take 1 tablet by mouth as needed (congestion).     . Flaxseed, Linseed, (FLAXSEED OIL) 1000 MG CAPS Take by mouth as directed.     . metoprolol tartrate (LOPRESSOR) 25 MG tablet Take 1 tablet (25 mg total) by mouth 2 (two) times daily for 42 days, THEN 0.5 tablets (12.5 mg total) 2 (two) times daily for 21 days. Then stop.. 105 tablet 0  . Multiple Minerals (CALCIUM/MAGNESIUM/ZINC PO) Take 1 tablet by mouth daily.    . phenylephrine (SUDAFED PE) 10 MG TABS  tablet Take 10 mg by mouth daily as needed (congestion).    Marland Kitchen Pramoxine-Camphor-Zinc Acetate (ANTI ITCH EX) Apply 1 application topically daily as needed (itching).    . sildenafil (REVATIO) 20 MG tablet Take 3-5 tablets 1 hour prior to sexual activity (Patient taking differently: Take 60-100 mg by mouth daily as needed (ED). take 1 hour prior to sexual activity) 30 tablet 3  . vitamin E 180 MG (400 UNITS) capsule Take 400 Units by mouth daily.     No current facility-administered medications on file prior to visit.        ROS:  All others reviewed and negative.  Objective        PE:  BP 138/78   Pulse 82   Temp 98.3 F (36.8 C) (Oral)   Ht 6' (1.829 m)   Wt 171 lb (77.6 kg)   SpO2 98%   BMI 23.19 kg/m                 Constitutional: Pt appears in NAD               HENT: Head: NCAT.                Right Ear: External ear normal.                 Left Ear: External ear normal.                Eyes: . Pupils are equal, round, and reactive to light. Conjunctivae and EOM are normal               Nose: without d/c or deformity               Neck: Neck supple. Gross normal ROM               Cardiovascular: Normal rate and regular rhythm.                 Pulmonary/Chest: Effort normal and breath sounds without rales or wheezing.                Abd:  Soft, NT, ND, + BS, no organomegaly               Neurological: Pt is alert. At baseline orientation, motor grossly intact               Skin: Skin is warm. No rashes, no other new lesions, LE edema - none               Psychiatric: Pt behavior is normal without agitation   Micro: none  Cardiac tracings I have personally interpreted today:  none  Pertinent Radiological findings (summarize): none   Lab Results  Component Value Date   WBC 5.2 06/07/2020   HGB 14.4 06/07/2020   HCT 41.5  06/07/2020   PLT 220 06/07/2020   GLUCOSE 82 06/07/2020   CHOL 153 11/01/2019   TRIG 85.0 11/01/2019   HDL 54.50 11/01/2019   LDLCALC 82 11/01/2019    ALT 15 11/01/2019   AST 20 11/01/2019   NA 139 06/07/2020   K 4.3 06/07/2020   CL 105 06/07/2020   CREATININE 1.00 06/07/2020   BUN 12 06/07/2020   CO2 22 06/07/2020   TSH 1.76 11/01/2019   PSA 0.44 11/01/2019   Assessment/Plan:  Michael Lindsey is a 70 y.o. White or Caucasian [1] male with  has a past medical history of Erectile dysfunction (11/01/2019), Fracture, Gastritis (09/13/2007), GERD (gastroesophageal reflux disease), Hepatitis A (1972), Internal hemorrhoids (09/13/2007), Osteoporosis, and Paroxysmal atrial fibrillation ([place]). Healthcare maintenance Age and sex appropriate education and counseling updated with regular exercise and diet Referrals for preventative services - none needed, but is interested in cardiac ct score self pay - will order Immunizations addressed - none needed Smoking counseling  - none needed Evidence for depression or other mood disorder - none significant Most recent labs reviewed. I have personally reviewed and have noted: 1) the patient's medical and social history 2) The patient's current medications and supplements 3) The patient's height, weight, and BMI have been recorded in the chart   Followup: Return in about 1 year (around 11/12/2021).  Cathlean Cower, MD 11/12/2020 8:30 PM [facility]

## 2020-11-12 NOTE — Patient Instructions (Signed)
Please continue all other medications as before, and refills have been done if requested.  Please have the pharmacy call with any other refills you may need.  Please continue your efforts at being more active, low cholesterol diet, and weight control.  You are otherwise up to date with prevention measures today.  Please keep your appointments with your specialists as you may have planned  We have discussed the Cardiac CT Score test to measure the calcification level (if any) in your heart arteries.  This test has been ordered in our [facility], so please call [facility] directly, as they prefer this, at [phone] to be scheduled.  Please make an Appointment to return for your 1 year visit, or sooner if needed, with Lab testing by Appointment as well, to be done about 3-5 days before at the [facility] (so this is for TWO appointments - please see the scheduling desk as you leave)  Due to the ongoing Covid 19 pandemic, our lab now requires an appointment for any labs done at our office.  If you need labs done and do not have an appointment, please call our office ahead of time to schedule before presenting to the lab for your testing.

## 2020-12-03 ENCOUNTER — Encounter: Payer: Self-pay | Admitting: Internal Medicine

## 2020-12-13 ENCOUNTER — Encounter: Payer: Self-pay | Admitting: Internal Medicine

## 2020-12-13 ENCOUNTER — Ambulatory Visit: Payer: Medicare Other | Admitting: Internal Medicine

## 2020-12-13 ENCOUNTER — Other Ambulatory Visit: Payer: Self-pay

## 2020-12-13 VITALS — BP 114/70 | HR 97 | Ht 72.0 in | Wt 172.4 lb

## 2020-12-13 DIAGNOSIS — I480 Paroxysmal atrial fibrillation: Secondary | ICD-10-CM | POA: Diagnosis not present

## 2020-12-13 NOTE — Patient Instructions (Signed)
Medication Instructions:  Your physician recommends that you continue on your current medications as directed. Please refer to the Current Medication list given to you today.  Labwork: None ordered.  Testing/Procedures: None ordered.  Follow-Up: Your physician wants you to follow-up in: 6 months with Thompson Grayer, MD   Any Other Special Instructions Will Be Listed Below (If Applicable).  If you need a refill on your cardiac medications before your next appointment, please call your pharmacy.

## 2020-12-13 NOTE — Progress Notes (Signed)
PCP: Biagio Borg, MD   Primary EP: Dr Anice Paganini Michael Lindsey is a 70 y.o. male who presents today for routine electrophysiology followup.  Since last being seen in our clinic, the patient reports doing very well.  Today, he denies symptoms of palpitations, chest pain, shortness of breath,  lower extremity edema, dizziness, presyncope, or syncope.  The patient is otherwise without complaint today.   Past Medical History:  Diagnosis Date  . Erectile dysfunction 11/01/2019  . Fracture    L arm @ 11; 3 ribs with shoulder separation & PTX 2003  . Gastritis 09/13/2007   EGD  . GERD (gastroesophageal reflux disease)   . Hepatitis A 1972   from exposure to septic tank which malfunctioned  . Internal hemorrhoids 09/13/2007   COLON  . Osteoporosis   . Paroxysmal atrial fibrillation (HCC)    chads2vasc score is 1   Past Surgical History:  Procedure Laterality Date  . ATRIAL FIBRILLATION ABLATION N/A 06/14/2020   Procedure: ATRIAL FIBRILLATION ABLATION;  Surgeon: Thompson Grayer, MD;  Location: [facility];  Service: Cardiovascular;  Laterality: N/A;  . cataract surgery  10/2014  . COLONOSCOPY  2011   Dr Sharlett Iles  . HERNIA REPAIR    . SKIN CANCER EXCISION  2011   R calf, Dr Jarome Matin  . UPPER GASTROINTESTINAL ENDOSCOPY  2003 & 2008    H pylori 2008    ROS- all systems are reviewed and negatives except as per HPI above  Current Outpatient Medications  Medication Sig Dispense Refill  . acetaminophen (TYLENOL) 500 MG tablet Take 500 mg by mouth as needed for moderate pain or headache.     . Ascorbic Acid (VITAMIN C) 1000 MG tablet Take 1,000 mg by mouth daily.    Marland Kitchen BLACK ELDERBERRY,BERRY-FLOWER, PO Take 1,000 mg by mouth daily.    . Carboxymethylcellulose Sodium (LUBRICANT EYE DROPS OP) Place 1 drop into both eyes 3 (three) times daily.    . cholecalciferol (VITAMIN D) 1000 UNITS tablet Take 1,000 Units by mouth daily.    Marland Kitchen dextromethorphan-guaiFENesin (MUCINEX DM) 30-600 MG  12hr tablet Take 1 tablet by mouth as needed (congestion).     . Flaxseed, Linseed, (FLAXSEED OIL) 1000 MG CAPS Take by mouth as directed.     . Multiple Minerals (CALCIUM/MAGNESIUM/ZINC PO) Take 1 tablet by mouth daily.    . phenylephrine (SUDAFED PE) 10 MG TABS tablet Take 10 mg by mouth daily as needed (congestion).    Marland Kitchen Pramoxine-Camphor-Zinc Acetate (ANTI ITCH EX) Apply 1 application topically daily as needed (itching).    . sildenafil (REVATIO) 20 MG tablet Take 3-5 tablets 1 hour prior to sexual activity 30 tablet 3  . vitamin E 180 MG (400 UNITS) capsule Take 400 Units by mouth daily.     No current facility-administered medications for this visit.    Physical Exam: Vitals:   12/13/20 0853  BP: 114/70  Pulse: 97  SpO2: 98%  Weight: 172 lb 6.4 oz (78.2 kg)  Height: 6' (1.829 m)    GEN- The patient is well appearing, alert and oriented x 3 today.   Head- normocephalic, atraumatic Eyes-  Sclera clear, conjunctiva pink Ears- hearing intact Oropharynx- clear Lungs- Clear to ausculation bilaterally, normal work of breathing Heart- Regular rate and rhythm, no murmurs, rubs or gallops, PMI not laterally displaced GI- soft, NT, ND, + BS Extremities- no clubbing, cyanosis, or edema  Wt Readings from Last 3 Encounters:  12/13/20 172 lb 6.4 oz (  78.2 kg)  11/12/20 171 lb (77.6 kg)  09/17/20 172 lb 12.8 oz (78.4 kg)    EKG tracing ordered today is personally reviewed and shows sinus with PVCs  Assessment and Plan:  1. Paroxysmal atrial fibrillation Doing very well post ablation off AAD therapy chads2vasc score is 1.  He does not require [place]   Return in 6 months  Thompson Grayer MD, [facility] 12/13/2020 8:53 AM

## 2021-03-28 DIAGNOSIS — H5202 Hypermetropia, left eye: Secondary | ICD-10-CM | POA: Diagnosis not present

## 2021-04-23 ENCOUNTER — Ambulatory Visit: Payer: Medicare Other | Admitting: Orthopaedic Surgery

## 2021-04-24 ENCOUNTER — Telehealth: Payer: Self-pay | Admitting: Family Medicine

## 2021-04-24 ENCOUNTER — Ambulatory Visit (INDEPENDENT_AMBULATORY_CARE_PROVIDER_SITE_OTHER): Payer: Medicare Other | Admitting: Family Medicine

## 2021-04-24 ENCOUNTER — Encounter: Payer: Self-pay | Admitting: Family Medicine

## 2021-04-24 ENCOUNTER — Ambulatory Visit: Payer: Medicare Other | Admitting: Orthopaedic Surgery

## 2021-04-24 ENCOUNTER — Other Ambulatory Visit: Payer: Self-pay

## 2021-04-24 DIAGNOSIS — M79671 Pain in right foot: Secondary | ICD-10-CM | POA: Diagnosis not present

## 2021-04-24 DIAGNOSIS — M1711 Unilateral primary osteoarthritis, right knee: Secondary | ICD-10-CM | POA: Diagnosis not present

## 2021-04-24 DIAGNOSIS — G8929 Other chronic pain: Secondary | ICD-10-CM

## 2021-04-24 MED ORDER — VITAMIN D-3 125 MCG (5000 UT) PO TABS: 1.0000 | ORAL_TABLET | Freq: Every day | ORAL | 3 refills | Status: AC

## 2021-04-24 NOTE — Progress Notes (Signed)
Office Visit Note   Patient: Michael Lindsey           Date of Birth: 05/31/1951           MRN: [id number] Visit Date: 04/24/2021 Requested by: Biagio Borg, MD [place],  [place] [address] PCP: Biagio Borg, MD  Subjective: Chief Complaint  Patient presents with   Right Foot - Pain    Sharp pains in the plantar aspect of the heel with taking long strides and walking down an incline. Has been going on x 3-4 weeks. Has had plantar fasciitis in the past, which caused pain in the arch. This feels different.    Right Knee - Pain    Would like to have an order placed to get another gel injection. Has been over 6 months since Dr. Durward Fortes did the last one. Would like to have it by 05/06/21, if possible, since he will be going to [place] on 05/07/21.    HPI: He is here with right heel pain.  He is getting ready to go to [place] on a golfing trip.  They will also be doing some walking and hiking.  For the past month he has been preparing for this trip and doing a lot more walking with longer strides.  He has began noticing intermittent sharp pain in his right heel when he strikes in a certain way.  He has had plantar fasciitis in the past but this pain is different.  He does note that he was diagnosed with osteoporosis about 10 years ago due to taking proton pump inhibitors.  He is now off those and his bone density improved.  He has never had a stress fracture.  He is taking vitamin D3 at 1000 IU daily.  He has right knee osteoarthritis and it is bothering him again, he would like to have gel injections again.  His last 1 was in November.                ROS:   All other systems were reviewed and are negative.  Objective: Vital Signs: There were no vitals taken for this visit.  Physical Exam:  General:  Alert and oriented, in no acute distress. Pulm:  Breathing unlabored. Psy:  Normal mood, congruent affect  Right heel: He has tight hamstrings and heel cords.  He has no  pain with flexion of the toes or supination of the foot against resistance.  He has no tenderness at the medial origin of the plantar fascia.  He is primarily tender on the medial and lateral aspect of the calcaneus.  Imaging: No results found.  Assessment & Plan: Right heel pain, concerning for stress reaction of the calcaneus -We will try WonderZorb gel pads. -Hamstring and heel cord stretches. -Increase vitamin D3 to 5000 IU daily. -If when he returns he is still having a lot of pain, we will obtain x-rays.  2.  Right knee osteoarthritis - Request approval for gel injections.     Procedures: No procedures performed        PMFS History: Patient Active Problem List   Diagnosis Date Noted   Throat pain 02/15/2020   Erectile dysfunction 11/01/2019   Chronic anticoagulation 11/01/2019   Pain in left knee 05/03/2019   Bilateral primary osteoarthritis of knee 06/18/2018   Leg pain 03/23/2018   PAF (paroxysmal atrial fibrillation) ([place]) 11/17/2016   Healthcare maintenance 10/20/2016   Palpitations 09/24/2016   Dyspnea 09/24/2016   Eczema 11/03/2013  Allergic rhinitis 06/01/2013   Skin cancer 08/11/2012   Dysphagia 12/08/2007   Osteopenia 09/14/2007   Esophageal reflux 04/13/2007   Past Medical History:  Diagnosis Date   Erectile dysfunction 11/01/2019   Fracture    L arm @ 11; 3 ribs with shoulder separation & PTX 2003   Gastritis 09/13/2007   EGD   GERD (gastroesophageal reflux disease)    Hepatitis A 1972   from exposure to septic tank which malfunctioned   Internal hemorrhoids 09/13/2007   COLON   Osteoporosis    Paroxysmal atrial fibrillation (HCC)    chads2vasc score is 1    Family History  Problem Relation Age of Onset   Depression Mother    Tuberculosis Mother    Coronary artery disease Father    Emphysema Father    Colon cancer Father 65   Depression Brother    Alcohol abuse Paternal Uncle    Diabetes Neg Hx    Stroke Neg Hx     Past Surgical  History:  Procedure Laterality Date   ATRIAL FIBRILLATION ABLATION N/A 06/14/2020   Procedure: ATRIAL FIBRILLATION ABLATION;  Surgeon: Thompson Grayer, MD;  Location: [place] [facility];  Service: Cardiovascular;  Laterality: N/A;   cataract surgery  10/2014   COLONOSCOPY  2011   Dr Sharlett Iles   HERNIA REPAIR     SKIN CANCER EXCISION  2011   R calf, Dr Jarome Matin   UPPER GASTROINTESTINAL ENDOSCOPY  2003 & 2008    H pylori 2008   Social History   Occupational History   Occupation: IMMIGRATION ATTORNEY  Tobacco Use   Smoking status: Former    Types: Cigarettes    Quit date: 10/06/1980    Years since quitting: 40.5   Smokeless tobacco: Former    Quit date: 1982   Tobacco comments:    intermittent smoker over 4 years in high school & again in 1983 before quitting, never > 2 mos @ a time,never > 1/2 ppd  Vaping Use   Vaping Use: Never used  Substance and Sexual Activity   Alcohol use: No    Comment: SOCIALLY,   Drug use: No   Sexual activity: Not Currently

## 2021-04-24 NOTE — Telephone Encounter (Signed)
Requesting approval for gel injections for right knee OA.

## 2021-04-25 NOTE — Telephone Encounter (Signed)
Noted  

## 2021-04-26 ENCOUNTER — Telehealth: Payer: Self-pay

## 2021-04-26 NOTE — Telephone Encounter (Signed)
VOB submitted for SynviscOne, right knee. Pending BV. 

## 2021-04-29 ENCOUNTER — Telehealth: Payer: Self-pay | Admitting: Orthopaedic Surgery

## 2021-04-29 ENCOUNTER — Telehealth: Payer: Self-pay

## 2021-04-29 NOTE — Telephone Encounter (Signed)
Can you advise? I do not see where it has been approved yet.

## 2021-04-29 NOTE — Telephone Encounter (Signed)
Talked with patient about gel injection being approved.  Will see patient on Thursday, 05/02/2021.

## 2021-04-29 NOTE — Telephone Encounter (Signed)
Pt called wanting to know if we have the gel inj? He states he has an appt on 05/02/21 and would like a CB to know if he needs to keep the appt.   [phone]

## 2021-04-29 NOTE — Telephone Encounter (Signed)
Approved for SynviscOne, bilateral knee. [facility] Patient will be responsible for 20% OOP. Co-pay of $25.00 No PA required  Appt. 05/02/2021 with Dr. Durward Fortes

## 2021-05-02 ENCOUNTER — Ambulatory Visit (INDEPENDENT_AMBULATORY_CARE_PROVIDER_SITE_OTHER): Payer: Medicare Other | Admitting: Orthopaedic Surgery

## 2021-05-02 ENCOUNTER — Encounter: Payer: Self-pay | Admitting: Orthopaedic Surgery

## 2021-05-02 ENCOUNTER — Other Ambulatory Visit: Payer: Self-pay

## 2021-05-02 DIAGNOSIS — M170 Bilateral primary osteoarthritis of knee: Secondary | ICD-10-CM

## 2021-05-02 DIAGNOSIS — M1711 Unilateral primary osteoarthritis, right knee: Secondary | ICD-10-CM

## 2021-05-02 MED ORDER — HYLAN G-F 20 48 MG/6ML IX SOSY
48.0000 mg | PREFILLED_SYRINGE | INTRA_ARTICULAR | Status: AC | PRN
  Administered 2021-05-02: 48 mg via INTRA_ARTICULAR

## 2021-05-02 NOTE — Progress Notes (Signed)
Office Visit Note   Patient: Michael Lindsey           Date of Birth: 1950-10-21           MRN: [id number] Visit Date: 05/02/2021              Requested by: Biagio Borg, MD [address] [place],  [place] [address] PCP: Biagio Borg, MD   Assessment & Plan: Visit Diagnoses:  1. Bilateral primary osteoarthritis of knee     Plan: Synvisc 1 has been preapproved.  Will inject along the medial compartment right knee for recurrent symptoms of osteoarthritis.  Has had the same in the past with good relief  Follow-Up Instructions: Return if symptoms worsen or fail to improve.   Orders:  Orders Placed This Encounter  Procedures   Large Joint Inj: R knee   No orders of the defined types were placed in this encounter.     Procedures: Large Joint Inj: R knee on 05/02/2021 4:29 PM Indications: pain and joint swelling Details: 25 G 1.5 in needle  Arthrogram: No  Medications: 48 mg Hylan 48 MG/6ML Outcome: tolerated well, no immediate complications Procedure, treatment alternatives, risks and benefits explained, specific risks discussed. Consent was given by the patient. Immediately prior to procedure a time out was called to verify the correct patient, procedure, equipment, support staff and site/side marked as required. Patient was prepped and draped in the usual sterile fashion.      Clinical Data: No additional findings.   Subjective: Chief Complaint  Patient presents with   Right Knee - Follow-up    Synvisc One  Patient presents today for the Synvisc One injection in his right knee.   HPI  Review of Systems   Objective: Vital Signs: There were no vitals taken for this visit.  Physical Exam  Ortho Exam right knee was not hot red warm or swollen.  Some mild medial joint pain diffusely.  No effusion or instability.  No calf pain.  Neurologically intact  Specialty Comments:  No specialty comments available.  Imaging: No results found.   PMFS  History: Patient Active Problem List   Diagnosis Date Noted   Throat pain 02/15/2020   Erectile dysfunction 11/01/2019   Chronic anticoagulation 11/01/2019   Pain in left knee 05/03/2019   Bilateral primary osteoarthritis of knee 06/18/2018   Leg pain 03/23/2018   PAF (paroxysmal atrial fibrillation) ([place]) 11/17/2016   Healthcare maintenance 10/20/2016   Palpitations 09/24/2016   Dyspnea 09/24/2016   Eczema 11/03/2013   Allergic rhinitis 06/01/2013   Skin cancer 08/11/2012   Dysphagia 12/08/2007   Osteopenia 09/14/2007   Esophageal reflux 04/13/2007   Past Medical History:  Diagnosis Date   Erectile dysfunction 11/01/2019   Fracture    L arm @ 11; 3 ribs with shoulder separation & PTX 2003   Gastritis 09/13/2007   EGD   GERD (gastroesophageal reflux disease)    Hepatitis A 1972   from exposure to septic tank which malfunctioned   Internal hemorrhoids 09/13/2007   COLON   Osteoporosis    Paroxysmal atrial fibrillation (HCC)    chads2vasc score is 1    Family History  Problem Relation Age of Onset   Depression Mother    Tuberculosis Mother    Coronary artery disease Father    Emphysema Father    Colon cancer Father 55   Depression Brother    Alcohol abuse Paternal Uncle    Diabetes Neg Hx  Stroke Neg Hx     Past Surgical History:  Procedure Laterality Date   ATRIAL FIBRILLATION ABLATION N/A 06/14/2020   Procedure: ATRIAL FIBRILLATION ABLATION;  Surgeon: Thompson Grayer, MD;  Location: [facility];  Service: Cardiovascular;  Laterality: N/A;   cataract surgery  10/2014   COLONOSCOPY  2011   Dr Sharlett Iles   HERNIA REPAIR     SKIN CANCER EXCISION  2011   R calf, Dr Jarome Matin   UPPER GASTROINTESTINAL ENDOSCOPY  2003 & 2008    H pylori 2008   Social History   Occupational History   Occupation: IMMIGRATION ATTORNEY  Tobacco Use   Smoking status: Former    Types: Cigarettes    Quit date: 10/06/1980    Years since quitting: 40.5   Smokeless tobacco: Former     Quit date: 1982   Tobacco comments:    intermittent smoker over 4 years in high school & again in 1983 before quitting, never > 2 mos @ a time,never > 1/2 ppd  Vaping Use   Vaping Use: Never used  Substance and Sexual Activity   Alcohol use: No    Comment: SOCIALLY,   Drug use: No   Sexual activity: Not Currently

## 2021-06-20 ENCOUNTER — Telehealth: Payer: Self-pay | Admitting: Interventional Cardiology

## 2021-06-20 DIAGNOSIS — L570 Actinic keratosis: Secondary | ICD-10-CM | POA: Diagnosis not present

## 2021-06-20 DIAGNOSIS — L821 Other seborrheic keratosis: Secondary | ICD-10-CM | POA: Diagnosis not present

## 2021-06-20 DIAGNOSIS — D485 Neoplasm of uncertain behavior of skin: Secondary | ICD-10-CM | POA: Diagnosis not present

## 2021-06-20 DIAGNOSIS — Z85828 Personal history of other malignant neoplasm of skin: Secondary | ICD-10-CM | POA: Diagnosis not present

## 2021-06-20 DIAGNOSIS — L814 Other melanin hyperpigmentation: Secondary | ICD-10-CM | POA: Diagnosis not present

## 2021-06-20 NOTE — Telephone Encounter (Signed)
I spoke with patient.  Dr Irish Lack has prescribed Sildenafil in the past and patient is asking for refill.  Would like to take one tablet dose of Sildenafil.  Patient last saw Dr Beau Fanny in May 2021 and was referred to EP.  Has been following with Dr Rayann Heman. Will send to Dr Irish Lack regarding Sildenafil refill and dosing and if patient should continue to follow with Dr Irish Lack also or just EP

## 2021-06-20 NOTE — Telephone Encounter (Signed)
OK to refill sildenafil as long as he is not using NTG.  Fine to see just EP if they are managing his AFib.

## 2021-06-20 NOTE — Telephone Encounter (Signed)
Patient ask that the nurse give him a call

## 2021-06-21 MED ORDER — SILDENAFIL CITRATE 20 MG PO TABS: ORAL_TABLET | ORAL | 3 refills | Status: DC

## 2021-06-21 NOTE — Telephone Encounter (Signed)
Refill sent to pharmacy and patient notified  

## 2021-06-21 NOTE — Telephone Encounter (Signed)
Start with 50 mg dose and then use 100 mg if 50 dose not work.  I am fine with either the 50 mg or 100 mg tabs.

## 2021-06-21 NOTE — Telephone Encounter (Signed)
*  STAT* If patient is at the pharmacy, call can be transferred to refill team.   1. Which medications need to be refilled? (please list name of each medication and dose if known) sildenafil 50 mg  2. Which pharmacy/location (including street and city if local pharmacy) is medication to be sent to? [facility]   3. Do they need a 30 day or 90 day supply? Patient would like 13   Patient is out

## 2021-06-25 MED ORDER — SILDENAFIL CITRATE 100 MG PO TABS: 100.0000 mg | ORAL_TABLET | Freq: Every day | ORAL | 3 refills | Status: DC | PRN

## 2021-06-25 NOTE — Telephone Encounter (Signed)
I spoke with patient and gave him information regarding follow up and new Sildenafil dose.  He does not use NTG.  Will send new prescription to [facility] for next refill.

## 2021-06-25 NOTE — Addendum Note (Signed)
Addended by: Thompson Grayer on: 06/25/2021 11:46 AM   Modules accepted: Orders

## 2021-10-08 NOTE — Progress Notes (Signed)
PCP:  Biagio Borg, MD Primary Cardiologist: None Electrophysiologist: Thompson Grayer, MD   Michael Lindsey is a 71 y.o. male seen today for Thompson Grayer, MD for routine electrophysiology followup.  Since last being seen in our clinic the patient reports doing very well.  he denies chest pain, palpitations, dyspnea, PND, orthopnea, nausea, vomiting, dizziness, syncope, edema, weight gain, or early satiety.  Past Medical History:  Diagnosis Date   Erectile dysfunction 11/01/2019   Fracture    L arm @ 11; 3 ribs with shoulder separation & PTX 2003   Gastritis 09/13/2007   EGD   GERD (gastroesophageal reflux disease)    Hepatitis A 1972   from exposure to septic tank which malfunctioned   Internal hemorrhoids 09/13/2007   COLON   Osteoporosis    Paroxysmal atrial fibrillation (HCC)    chads2vasc score is 1   Past Surgical History:  Procedure Laterality Date   ATRIAL FIBRILLATION ABLATION N/A 06/14/2020   Procedure: ATRIAL FIBRILLATION ABLATION;  Surgeon: Thompson Grayer, MD;  Location: [place] [facility];  Service: Cardiovascular;  Laterality: N/A;   cataract surgery  10/2014   COLONOSCOPY  2011   Dr Sharlett Iles   HERNIA REPAIR     SKIN CANCER EXCISION  2011   R calf, Dr Jarome Matin   UPPER GASTROINTESTINAL ENDOSCOPY  2003 & 2008    H pylori 2008    Current Outpatient Medications  Medication Sig Dispense Refill   acetaminophen (TYLENOL) 500 MG tablet Take 500 mg by mouth as needed for moderate pain or headache.      Ascorbic Acid (VITAMIN C) 1000 MG tablet Take 1,000 mg by mouth daily.     BLACK ELDERBERRY,BERRY-FLOWER, PO Take 1,000 mg by mouth daily.     Carboxymethylcellulose Sodium (LUBRICANT EYE DROPS OP) Place 1 drop into both eyes 3 (three) times daily.     Cholecalciferol (VITAMIN D-3) 125 MCG (5000 UT) TABS Take 1 tablet by mouth daily. 90 tablet 3   dextromethorphan-guaiFENesin (MUCINEX DM) 30-600 MG 12hr tablet Take 1 tablet by mouth as needed (congestion).       Flaxseed, Linseed, (FLAXSEED OIL) 1000 MG CAPS Take by mouth as directed.      sildenafil (VIAGRA) 100 MG tablet Take 1 tablet (100 mg total) by mouth daily as needed for erectile dysfunction. 10 tablet 3   vitamin E 180 MG (400 UNITS) capsule Take 400 Units by mouth daily.     No current facility-administered medications for this visit.    Allergies  Allergen Reactions   Chlorine     EYES WATER/NASAL CONGESTION    Social History   Socioeconomic History   Marital status: Married    Spouse name: Not on file   Number of children: 2   Years of education: 18   Highest education level: Not on file  Occupational History   Occupation: IMMIGRATION ATTORNEY  Tobacco Use   Smoking status: Former    Types: Cigarettes    Quit date: 10/06/1980    Years since quitting: 41.0   Smokeless tobacco: Former    Quit date: 1982   Tobacco comments:    intermittent smoker over 4 years in high school & again in 1983 before quitting, never > 2 mos @ a time,never > 1/2 ppd  Vaping Use   Vaping Use: Never used  Substance and Sexual Activity   Alcohol use: No    Comment: SOCIALLY,   Drug use: No   Sexual activity: Not Currently  Other Topics Concern   Not on file  Social History Narrative   GETS REG EXERCISE      Lives in [place]   Works as an Engineer, petroleum      Social Determinants of Radio broadcast assistant Strain: Not on file  Food Insecurity: Not on file  Transportation Needs: Not on file  Physical Activity: Not on file  Stress: Not on file  Social Connections: Not on file  Intimate Partner Violence: Not on file     Review of Systems: All other systems reviewed and are otherwise negative except as noted above.  Physical Exam: Vitals:   10/09/21 0833  BP: 104/60  Pulse: 81  SpO2: 97%  Weight: 172 lb (78 kg)  Height: 6' (1.829 m)    GEN- The patient is well appearing, alert and oriented x 3 today.   HEENT: normocephalic, atraumatic; sclera clear, conjunctiva  pink; hearing intact; oropharynx clear; neck supple, no JVP Lymph- no cervical lymphadenopathy Lungs- Clear to ausculation bilaterally, normal work of breathing.  No wheezes, rales, rhonchi Heart- Regular rate and rhythm, no murmurs, rubs or gallops, PMI not laterally displaced GI- soft, non-tender, non-distended, bowel sounds present, no hepatosplenomegaly Extremities- no clubbing, cyanosis, or edema; DP/PT/radial pulses 2+ bilaterally MS- no significant deformity or atrophy Skin- warm and dry, no rash or lesion Psych- euthymic mood, full affect Neuro- strength and sensation are intact  EKG is ordered. Personal review of EKG from today shows NSR at 81 bpm  Additional studies reviewed include: Previous EP office notes.   Assessment and Plan:  1. Paroxysmal atrial fibrillation EKG today shows NSR Doing very well post ablation off AAD therapy CHA2DS2VASC is currently only 1.  Not on [place] per guidelines.  We discussed the use of monitoring with an ILR in the event he gains additional risk factors but wishes to continue to avoid [place].    Follow up with EP APP in 6-9 months   Shirley Friar, [place]  10/09/21 8:35 AM

## 2021-10-09 ENCOUNTER — Encounter: Payer: Self-pay | Admitting: Student

## 2021-10-09 ENCOUNTER — Other Ambulatory Visit: Payer: Self-pay

## 2021-10-09 ENCOUNTER — Ambulatory Visit: Payer: Medicare Other | Admitting: Student

## 2021-10-09 VITALS — BP 104/60 | HR 81 | Ht 72.0 in | Wt 172.0 lb

## 2021-10-09 DIAGNOSIS — I480 Paroxysmal atrial fibrillation: Secondary | ICD-10-CM

## 2021-10-09 NOTE — Patient Instructions (Signed)
Medication Instructions:  Your physician recommends that you continue on your current medications as directed. Please refer to the Current Medication list given to you today.  *If you need a refill on your cardiac medications before your next appointment, please call your pharmacy*   Lab Work: None  If you have labs (blood work) drawn today and your tests are completely normal, you will receive your results only by: [place] (if you have MyChart) OR A paper copy in the mail If you have any lab test that is abnormal or we need to change your treatment, we will call you to review the results.   Follow-Up: At [facility], you and your health needs are our priority.  As part of our continuing mission to provide you with exceptional heart care, we have created designated Provider Care Teams.  These Care Teams include your primary Cardiologist (physician) and Advanced Practice Providers (APPs -  Physician Assistants and Nurse Practitioners) who all work together to provide you with the care you need, when you need it.  We recommend signing up for the patient portal called "MyChart".  Sign up information is provided on this After Visit Summary.  MyChart is used to connect with patients for Virtual Visits (Telemedicine).  Patients are able to view lab/test results, encounter notes, upcoming appointments, etc.  Non-urgent messages can be sent to your provider as well.   To learn more about what you can do with MyChart, go to [email or web address].    Your next appointment:   9 month(s)  The format for your next appointment:   In Person  Provider:   Legrand Como "Oda Kilts, PA-C

## 2021-10-14 DIAGNOSIS — D485 Neoplasm of uncertain behavior of skin: Secondary | ICD-10-CM | POA: Diagnosis not present

## 2021-10-14 DIAGNOSIS — L821 Other seborrheic keratosis: Secondary | ICD-10-CM | POA: Diagnosis not present

## 2021-10-14 DIAGNOSIS — Z85828 Personal history of other malignant neoplasm of skin: Secondary | ICD-10-CM | POA: Diagnosis not present

## 2021-10-17 ENCOUNTER — Encounter: Payer: Self-pay | Admitting: Internal Medicine

## 2021-10-17 ENCOUNTER — Ambulatory Visit (INDEPENDENT_AMBULATORY_CARE_PROVIDER_SITE_OTHER): Payer: Medicare Other | Admitting: Internal Medicine

## 2021-10-17 ENCOUNTER — Other Ambulatory Visit: Payer: Self-pay

## 2021-10-17 VITALS — BP 112/68 | HR 79 | Temp 98.0°F | Ht 72.0 in | Wt 173.0 lb

## 2021-10-17 DIAGNOSIS — Z0001 Encounter for general adult medical examination with abnormal findings: Secondary | ICD-10-CM

## 2021-10-17 DIAGNOSIS — J309 Allergic rhinitis, unspecified: Secondary | ICD-10-CM

## 2021-10-17 DIAGNOSIS — K219 Gastro-esophageal reflux disease without esophagitis: Secondary | ICD-10-CM

## 2021-10-17 DIAGNOSIS — H6981 Other specified disorders of Eustachian tube, right ear: Secondary | ICD-10-CM

## 2021-10-17 NOTE — Progress Notes (Signed)
Patient ID: Michael Lindsey, male   DOB: 1951-07-24, 71 y.o.   MRN: [id number]         Chief Complaint:: wellness exam and Office Visit (Fluid in right ear)         HPI:  Michael Lindsey is a 71 y.o. male here for wellness exam; declines covid booster, o/w up to date                       Also c/o mid to mod 2 wks right ear fullness and popping, muffled hearing at times without fever, chills, HA, worsening pain, ST, cough, but does have mild allergyy sinus symptoms as well. Does have several wks ongoing nasal allergy symptoms with clearish congestion, itch and sneezing, without fever, pain, ST, cough, swelling or wheezing  Pt denies chest pain, increased sob or doe, wheezing, orthopnea, PND, increased LE swelling, palpitations, dizziness or syncope.   Pt denies polydipsia, polyuria, or new focal neuro s/s.   Pt denies fever, wt loss, night sweats, loss of appetite, or other constitutional symptoms  Denies worsening reflux, abd pain, dysphagia, n/v, bowel change or blood.   Wt Readings from Last 3 Encounters:  10/17/21 173 lb (78.5 kg)  10/09/21 172 lb (78 kg)  12/13/20 172 lb 6.4 oz (78.2 kg)   BP Readings from Last 3 Encounters:  10/17/21 112/68  10/09/21 104/60  12/13/20 114/70   Immunization History  Administered Date(s) Administered   Fluad Quad(high Dose 65+) 06/12/2019   Influenza Split 07/10/2011   Influenza Whole 07/06/2002, 08/06/2012, 06/20/2013   Influenza, High Dose Seasonal PF 08/09/2017, 07/15/2018   Influenza-Unspecified 08/05/2015, 09/10/2020, 08/20/2021   PFIZER(Purple Top)SARS-COV-2 Vaccination 10/28/2019, 11/18/2019, 09/10/2020, 07/22/2021   Pneumococcal Conjugate-13 10/20/2016   Pneumococcal Polysaccharide-23 10/28/2017   Td 05/23/2002   Tdap 08/12/2013, 10/08/2014   Zoster Recombinat (Shingrix) 10/30/2018, 02/19/2019   Zoster, Live 07/06/2012   There are no preventive care reminders to display for this patient.     Past Medical History:  Diagnosis Date    Erectile dysfunction 11/01/2019   Fracture    L arm @ 11; 3 ribs with shoulder separation & PTX 2003   Gastritis 09/13/2007   EGD   GERD (gastroesophageal reflux disease)    Hepatitis A 1972   from exposure to septic tank which malfunctioned   Internal hemorrhoids 09/13/2007   COLON   Osteoporosis    Paroxysmal atrial fibrillation (HCC)    chads2vasc score is 1   Past Surgical History:  Procedure Laterality Date   ATRIAL FIBRILLATION ABLATION N/A 06/14/2020   Procedure: ATRIAL FIBRILLATION ABLATION;  Surgeon: Thompson Grayer, MD;  Location: [facility];  Service: Cardiovascular;  Laterality: N/A;   cataract surgery  10/2014   COLONOSCOPY  2011   Dr Sharlett Iles   HERNIA REPAIR     SKIN CANCER EXCISION  2011   R calf, Dr Jarome Matin   UPPER GASTROINTESTINAL ENDOSCOPY  2003 & 2008    H pylori 2008    reports that he quit smoking about 41 years ago. His smoking use included cigarettes. He quit smokeless tobacco use about 41 years ago. He reports that he does not drink alcohol and does not use drugs. family history includes Alcohol abuse in his paternal uncle; Colon cancer (age of onset: 73) in his father; Coronary artery disease in his father; Depression in his brother and mother; Emphysema in his father; Tuberculosis in his mother. Allergies  Allergen Reactions   Chlorine  EYES WATER/NASAL CONGESTION   Current Outpatient Medications on File Prior to Visit  Medication Sig Dispense Refill   acetaminophen (TYLENOL) 500 MG tablet Take 500 mg by mouth as needed for moderate pain or headache.      Ascorbic Acid (VITAMIN C) 1000 MG tablet Take 1,000 mg by mouth daily.     Carboxymethylcellulose Sodium (LUBRICANT EYE DROPS OP) Place 1 drop into both eyes 3 (three) times daily.     Cholecalciferol (VITAMIN D-3) 125 MCG (5000 UT) TABS Take 1 tablet by mouth daily. 90 tablet 3   dextromethorphan-guaiFENesin (MUCINEX DM) 30-600 MG 12hr tablet Take 1 tablet by mouth as needed (congestion).       Flaxseed, Linseed, (FLAXSEED OIL) 1000 MG CAPS Take by mouth as directed.      sildenafil (VIAGRA) 100 MG tablet Take 1 tablet (100 mg total) by mouth daily as needed for erectile dysfunction. 10 tablet 3   vitamin E 180 MG (400 UNITS) capsule Take 400 Units by mouth daily.     BLACK ELDERBERRY,BERRY-FLOWER, PO Take 1,000 mg by mouth daily. (Patient not taking: Reported on 10/17/2021)     No current facility-administered medications on file prior to visit.        ROS:  All others reviewed and negative.  Objective        PE:  BP 112/68 (BP Location: Left Arm, Patient Position: Sitting, Cuff Size: Large)    Pulse 79    Temp 98 F (36.7 C) (Oral)    Ht 6' (1.829 m)    Wt 173 lb (78.5 kg)    SpO2 99%    BMI 23.46 kg/m                 Constitutional: Pt appears in NAD               HENT: Head: NCAT.                Right Ear: External ear normal.                 Left Ear: External ear normal. Bilat tm's with mild erythema.  Max sinus areas non tender.  Pharynx with mild erythema, no exudate               Eyes: . Pupils are equal, round, and reactive to light. Conjunctivae and EOM are normal               Nose: without d/c or deformity               Neck: Neck supple. Gross normal ROM               Cardiovascular: Normal rate and regular rhythm.                 Pulmonary/Chest: Effort normal and breath sounds without rales or wheezing.                Abd:  Soft, NT, ND, + BS, no organomegaly               Neurological: Pt is alert. At baseline orientation, motor grossly intact               Skin: Skin is warm. No rashes, no other new lesions, LE edema - none               Psychiatric: Pt behavior is normal without agitation   Micro: none  Cardiac tracings I have personally  interpreted today:  none  Pertinent Radiological findings (summarize): none   Lab Results  Component Value Date   WBC 5.2 06/07/2020   HGB 14.4 06/07/2020   HCT 41.5 06/07/2020   PLT 220 06/07/2020   GLUCOSE 82  06/07/2020   CHOL 153 11/01/2019   TRIG 85.0 11/01/2019   HDL 54.50 11/01/2019   LDLCALC 82 11/01/2019   ALT 15 11/01/2019   AST 20 11/01/2019   NA 139 06/07/2020   K 4.3 06/07/2020   CL 105 06/07/2020   CREATININE 1.00 06/07/2020   BUN 12 06/07/2020   CO2 22 06/07/2020   TSH 1.76 11/01/2019   PSA 0.44 11/01/2019   Assessment/Plan:  Michael Lindsey is a 71 y.o. White or Caucasian [1] male with  has a past medical history of Erectile dysfunction (11/01/2019), Fracture, Gastritis (09/13/2007), GERD (gastroesophageal reflux disease), Hepatitis A (1972), Internal hemorrhoids (09/13/2007), Osteoporosis, and Paroxysmal atrial fibrillation ([place]).  Encounter for well adult exam with abnormal findings Age and sex appropriate education and counseling updated with regular exercise and diet Referrals for preventative services - none needed Immunizations addressed - declines covid booster Smoking counseling  - none needed Evidence for depression or other mood disorder - none significant Most recent labs reviewed. I have personally reviewed and have noted: 1) the patient's medical and social history 2) The patient's current medications and supplements 3) The patient's height, weight, and BMI have been recorded in the chart   Allergic rhinitis Mild uncontroleld, for otc antihistamine and nasacort asd,  to f/u any worsening symptoms or concerns  Acute dysfunction of right eustachian tube Also for mucinex bid prn  Esophageal reflux Stable, cont current med tx - tums prn  Followup: Return in about 1 year (around 10/17/2022).  Cathlean Cower, MD 10/24/2021 4:44 AM [facility]

## 2021-10-17 NOTE — Patient Instructions (Signed)
Please consider taking the OTC anthistamine/nasacort/mucinex for the right ear symptoms  Please continue all other medications as before, and refills have been done if requested.  Please have the pharmacy call with any other refills you may need.  Please continue your efforts at being more active, low cholesterol diet, and weight control.  You are otherwise up to date with prevention measures today.  Please keep your appointments with your specialists as you may have planned  Please go to the LAB at the blood drawing area for the tests to be done - at the [facility] lab next week as you prefer  You will be contacted by phone if any changes need to be made immediately.  Otherwise, you will receive a letter about your results with an explanation, but please check with MyChart first.  Please remember to sign up for MyChart if you have not done so, as this will be important to you in the future with finding out test results, communicating by private email, and scheduling acute appointments online when needed.  Please make an Appointment to return for your 1 year visit, or sooner if needed

## 2021-10-24 ENCOUNTER — Encounter: Payer: Self-pay | Admitting: Internal Medicine

## 2021-10-24 DIAGNOSIS — H6991 Unspecified Eustachian tube disorder, right ear: Secondary | ICD-10-CM | POA: Insufficient documentation

## 2021-10-24 DIAGNOSIS — H6981 Other specified disorders of Eustachian tube, right ear: Secondary | ICD-10-CM | POA: Insufficient documentation

## 2021-10-24 NOTE — Assessment & Plan Note (Signed)

## 2021-10-24 NOTE — Assessment & Plan Note (Signed)
Mild uncontroleld, for otc antihistamine and nasacort asd,  to f/u any worsening symptoms or concerns

## 2021-10-24 NOTE — Assessment & Plan Note (Signed)
Also for mucinex bid prn

## 2021-10-24 NOTE — Assessment & Plan Note (Signed)
Stable, cont current med tx - tums prn

## 2021-11-14 ENCOUNTER — Encounter: Payer: Self-pay | Admitting: Internal Medicine

## 2021-11-14 ENCOUNTER — Ambulatory Visit (INDEPENDENT_AMBULATORY_CARE_PROVIDER_SITE_OTHER): Payer: Medicare Other | Admitting: Internal Medicine

## 2021-11-14 ENCOUNTER — Other Ambulatory Visit: Payer: Self-pay

## 2021-11-14 VITALS — BP 108/62 | HR 80 | Temp 97.7°F | Ht 72.0 in | Wt 173.0 lb

## 2021-11-14 DIAGNOSIS — I480 Paroxysmal atrial fibrillation: Secondary | ICD-10-CM

## 2021-11-14 DIAGNOSIS — N529 Male erectile dysfunction, unspecified: Secondary | ICD-10-CM

## 2021-11-14 DIAGNOSIS — C4490 Unspecified malignant neoplasm of skin, unspecified: Secondary | ICD-10-CM

## 2021-11-14 DIAGNOSIS — J309 Allergic rhinitis, unspecified: Secondary | ICD-10-CM | POA: Diagnosis not present

## 2021-11-14 DIAGNOSIS — K219 Gastro-esophageal reflux disease without esophagitis: Secondary | ICD-10-CM

## 2021-11-14 NOTE — Assessment & Plan Note (Signed)
Stable, does not want PPI or other, prefers vinegar daily which seems to help

## 2021-11-14 NOTE — Patient Instructions (Signed)
Please continue all other medications as before, and refills have been done if requested.  Please have the pharmacy call with any other refills you may need.  Please continue your efforts at being more active, low cholesterol diet, and weight control.  You are otherwise up to date with prevention measures today.  Please keep your appointments with your specialists as you may have planned  Please make an Appointment to return for your 1 year visit, or sooner if needed, with Lab testing by Appointment as well, to be done about 3-5 days before at the [facility] (so this is for TWO appointments - please see the scheduling desk as you leave)   Due to the ongoing Covid 19 pandemic, our lab now requires an appointment for any labs done at our office.  If you need labs done and do not have an appointment, please call our office ahead of time to schedule before presenting to the lab for your testing.

## 2021-11-14 NOTE — Assessment & Plan Note (Signed)
Mild, for viagra prn to continue

## 2021-11-14 NOTE — Assessment & Plan Note (Addendum)
asympt and Stable rate and volume,  Cont same tx and f/u cards, to f/u any worsening symptoms or concerns

## 2021-11-14 NOTE — Assessment & Plan Note (Signed)
Mild seasonal, for otc antihistamine and nasacort asd prn,  to f/u any worsening symptoms or concerns

## 2021-11-14 NOTE — Progress Notes (Signed)
Patient ID: Michael Lindsey, male   DOB: [birth year], 71 y.o.   MRN: [id number]        Chief Complaint: f/u exam       HPI:  Michael Lindsey is a 71 y.o. male here to f/u, was unable to have lab testing done prior to visit but still intends to do so.  Pt denies chest pain, increased sob or doe, wheezing, orthopnea, PND, increased LE swelling, palpitations, dizziness or syncope.   Pt denies polydipsia, polyuria, or new focal neuro s/s.  Does have several wks ongoing nasal allergy symptoms with clearish congestion, itch and sneezing, without fever, pain, ST, cough, swelling or wheezing.  Denies worsening reflux, abd pain, dysphagia, n/v, bowel change or blood.  Sees Cardiology dermatology primarily.  Last seen GI about 15 yrs ago, done well since        Wt Readings from Last 3 Encounters:  11/14/21 173 lb (78.5 kg)  10/17/21 173 lb (78.5 kg)  10/09/21 172 lb (78 kg)   BP Readings from Last 3 Encounters:  11/14/21 108/62  10/17/21 112/68  10/09/21 104/60         Past Medical History:  Diagnosis Date   Erectile dysfunction 11/01/2019   Fracture    L arm @ 10; 3 ribs with shoulder separation & PTX 2003   Gastritis 09/13/2007   EGD   GERD (gastroesophageal reflux disease)    Hepatitis A 1972   from exposure to septic tank which malfunctioned   Internal hemorrhoids 09/13/2007   COLON   Osteoporosis    Paroxysmal atrial fibrillation (HCC)    chads2vasc score is 1   Past Surgical History:  Procedure Laterality Date   ATRIAL FIBRILLATION ABLATION N/A 06/14/2020   Procedure: ATRIAL FIBRILLATION ABLATION;  Surgeon: Thompson Grayer, MD;  Location: [facility];  Service: Cardiovascular;  Laterality: N/A;   cataract surgery  10/2014   COLONOSCOPY  2011   Dr Sharlett Iles   HERNIA REPAIR     SKIN CANCER EXCISION  2011   R calf, Dr Jarome Matin   UPPER GASTROINTESTINAL ENDOSCOPY  2003 & 2008    H pylori 2008    reports that he quit smoking about 41 years ago. His smoking use included  cigarettes. He quit smokeless tobacco use about 41 years ago. He reports that he does not drink alcohol and does not use drugs. family history includes Alcohol abuse in his paternal uncle; Colon cancer (age of onset: 20) in his father; Coronary artery disease in his father; Depression in his brother and mother; Emphysema in his father; Tuberculosis in his mother. Allergies  Allergen Reactions   Chlorine     EYES WATER/NASAL CONGESTION   Current Outpatient Medications on File Prior to Visit  Medication Sig Dispense Refill   acetaminophen (TYLENOL) 500 MG tablet Take 500 mg by mouth as needed for moderate pain or headache.      Ascorbic Acid (VITAMIN C) 1000 MG tablet Take 1,000 mg by mouth daily.     Carboxymethylcellulose Sodium (LUBRICANT EYE DROPS OP) Place 1 drop into both eyes 3 (three) times daily.     Cholecalciferol (VITAMIN D-3) 125 MCG (5000 UT) TABS Take 1 tablet by mouth daily. 90 tablet 3   dextromethorphan-guaiFENesin (MUCINEX DM) 30-600 MG 12hr tablet Take 1 tablet by mouth as needed (congestion).      Flaxseed, Linseed, (FLAXSEED OIL) 1000 MG CAPS Take by mouth as directed.      sildenafil (VIAGRA) 100 MG tablet Take  1 tablet (100 mg total) by mouth daily as needed for erectile dysfunction. 10 tablet 3   vitamin E 180 MG (400 UNITS) capsule Take 400 Units by mouth daily.     No current facility-administered medications on file prior to visit.        ROS:  All others reviewed and negative.  Objective        PE:  BP 108/62    Pulse 80    Temp 97.7 F (36.5 C) (Oral)    Ht 6' (1.829 m)    Wt 173 lb (78.5 kg)    SpO2 98%    BMI 23.46 kg/m                 Constitutional: Pt appears in NAD               HENT: Head: NCAT.                Right Ear: External ear normal.                 Left Ear: External ear normal.                Eyes: . Pupils are equal, round, and reactive to light. Conjunctivae and EOM are normal               Nose: without d/c or deformity                Neck: Neck supple. Gross normal ROM               Cardiovascular: Normal rate and regular rhythm.                 Pulmonary/Chest: Effort normal and breath sounds without rales or wheezing.                Abd:  Soft, NT, ND, + BS, no organomegaly               Neurological: Pt is alert. At baseline orientation, motor grossly intact               Skin: Skin is warm. No rashes, no other new lesions, LE edema - none               Psychiatric: Pt behavior is normal without agitation   Micro: none  Cardiac tracings I have personally interpreted today:  none  Pertinent Radiological findings (summarize): none   Lab Results  Component Value Date   WBC 5.2 06/07/2020   HGB 14.4 06/07/2020   HCT 41.5 06/07/2020   PLT 220 06/07/2020   GLUCOSE 82 06/07/2020   CHOL 153 11/01/2019   TRIG 85.0 11/01/2019   HDL 54.50 11/01/2019   LDLCALC 82 11/01/2019   ALT 15 11/01/2019   AST 20 11/01/2019   NA 139 06/07/2020   K 4.3 06/07/2020   CL 105 06/07/2020   CREATININE 1.00 06/07/2020   BUN 12 06/07/2020   CO2 22 06/07/2020   TSH 1.76 11/01/2019   PSA 0.44 11/01/2019   Assessment/Plan:  Michael Lindsey is a 71 y.o. White or Caucasian [1] male with  has a past medical history of Erectile dysfunction (11/01/2019), Fracture, Gastritis (09/13/2007), GERD (gastroesophageal reflux disease), Hepatitis A (1972), Internal hemorrhoids (09/13/2007), Osteoporosis, and Paroxysmal atrial fibrillation ([place]).  PAF (paroxysmal atrial fibrillation) (HCC) asympt and Stable rate and volume,  Cont same tx and f/u cards, to f/u any worsening symptoms or concerns  Allergic  rhinitis Mild seasonal, for otc antihistamine and nasacort asd prn,  to f/u any worsening symptoms or concerns  Esophageal reflux Stable, does not want PPI or other, prefers vinegar daily which seems to help  Erectile dysfunction Mild, for viagra prn to continue  Skin cancer Plans to f/u derm yearly,  to f/u any worsening symptoms or  concerns  Followup: Return in about 1 year (around 11/14/2022).  Cathlean Cower, MD 11/14/2021 8:35 AM [facility]

## 2021-11-14 NOTE — Assessment & Plan Note (Signed)
Plans to f/u derm yearly,  to f/u any worsening symptoms or concerns

## 2021-11-21 ENCOUNTER — Other Ambulatory Visit (INDEPENDENT_AMBULATORY_CARE_PROVIDER_SITE_OTHER): Payer: Medicare Other

## 2021-11-21 DIAGNOSIS — Z125 Encounter for screening for malignant neoplasm of prostate: Secondary | ICD-10-CM

## 2021-11-21 DIAGNOSIS — E559 Vitamin D deficiency, unspecified: Secondary | ICD-10-CM | POA: Diagnosis not present

## 2021-11-21 DIAGNOSIS — E7849 Other hyperlipidemia: Secondary | ICD-10-CM

## 2021-11-21 DIAGNOSIS — N320 Bladder-neck obstruction: Secondary | ICD-10-CM

## 2021-11-21 DIAGNOSIS — Z0000 Encounter for general adult medical examination without abnormal findings: Secondary | ICD-10-CM

## 2021-11-21 DIAGNOSIS — E538 Deficiency of other specified B group vitamins: Secondary | ICD-10-CM

## 2021-11-21 LAB — CBC WITH DIFFERENTIAL/PLATELET
Basophils Absolute: 0 10*3/uL (ref 0.0–0.1)
Basophils Relative: 0.6 % (ref 0.0–3.0)
Eosinophils Absolute: 0.4 10*3/uL (ref 0.0–0.7)
Eosinophils Relative: 7.5 % — ABNORMAL HIGH (ref 0.0–5.0)
HCT: 43.4 % (ref 39.0–52.0)
Hemoglobin: 14.7 g/dL (ref 13.0–17.0)
Lymphocytes Relative: 16.5 % (ref 12.0–46.0)
Lymphs Abs: 0.8 10*3/uL (ref 0.7–4.0)
MCHC: 33.8 g/dL (ref 30.0–36.0)
MCV: 96.3 fl (ref 78.0–100.0)
Monocytes Absolute: 0.5 10*3/uL (ref 0.1–1.0)
Monocytes Relative: 10.8 % (ref 3.0–12.0)
Neutro Abs: 3.2 10*3/uL (ref 1.4–7.7)
Neutrophils Relative %: 64.6 % (ref 43.0–77.0)
Platelets: 204 10*3/uL (ref 150.0–400.0)
RBC: 4.51 Mil/uL (ref 4.22–5.81)
RDW: 13.5 % (ref 11.5–15.5)
WBC: 4.9 10*3/uL (ref 4.0–10.5)

## 2021-11-21 LAB — BASIC METABOLIC PANEL
BUN: 13 mg/dL (ref 6–23)
CO2: 31 mEq/L (ref 19–32)
Calcium: 9 mg/dL (ref 8.4–10.5)
Chloride: 104 mEq/L (ref 96–112)
Creatinine, Ser: 0.91 mg/dL (ref 0.40–1.50)
GFR: 84.64 mL/min (ref >60.00)
Glucose, Bld: 92 mg/dL (ref 70–99)
Potassium: 4.4 mEq/L (ref 3.5–5.1)
Sodium: 139 mEq/L (ref 135–145)

## 2021-11-21 LAB — HEPATIC FUNCTION PANEL
ALT: 14 U/L (ref 0–53)
AST: 18 U/L (ref 0–37)
Albumin: 3.9 g/dL (ref 3.5–5.2)
Alkaline Phosphatase: 58 U/L (ref 39–117)
Bilirubin, Direct: 0.2 mg/dL (ref 0.0–0.3)
Total Bilirubin: 0.8 mg/dL (ref 0.2–1.2)
Total Protein: 6.2 g/dL (ref 6.0–8.3)

## 2021-11-21 LAB — VITAMIN B12: Vitamin B-12: 589 pg/mL (ref 211–911)

## 2021-11-21 LAB — URINALYSIS, ROUTINE W REFLEX MICROSCOPIC
Bilirubin Urine: NEGATIVE
Hgb urine dipstick: NEGATIVE
Ketones, ur: NEGATIVE
Leukocytes,Ua: NEGATIVE
Nitrite: NEGATIVE
RBC / HPF: NONE SEEN (ref 0–?)
Specific Gravity, Urine: 1.015 (ref 1.000–1.030)
Total Protein, Urine: NEGATIVE
Urine Glucose: NEGATIVE
Urobilinogen, UA: 0.2 (ref 0.0–1.0)
WBC, UA: NONE SEEN (ref 0–?)
pH: 6.5 (ref 5.0–8.0)

## 2021-11-21 LAB — LIPID PANEL
Cholesterol: 152 mg/dL (ref 0–200)
HDL: 60.1 mg/dL (ref >39.00)
LDL Cholesterol: 83 mg/dL (ref 0–99)
NonHDL: 92.25
Total CHOL/HDL Ratio: 3
Triglycerides: 46 mg/dL (ref 0.0–149.0)
VLDL: 9.2 mg/dL (ref 0.0–40.0)

## 2021-11-21 LAB — TSH: TSH: 2.8 u[IU]/mL (ref 0.35–5.50)

## 2021-11-21 LAB — VITAMIN D 25 HYDROXY (VIT D DEFICIENCY, FRACTURES): VITD: 72.31 ng/mL (ref 30.00–100.00)

## 2021-11-21 LAB — PSA: PSA: 0.43 ng/mL (ref 0.10–4.00)

## 2022-03-31 ENCOUNTER — Telehealth: Payer: Self-pay | Admitting: Internal Medicine

## 2022-04-03 DIAGNOSIS — H903 Sensorineural hearing loss, bilateral: Secondary | ICD-10-CM | POA: Diagnosis not present

## 2022-04-03 DIAGNOSIS — H9313 Tinnitus, bilateral: Secondary | ICD-10-CM | POA: Insufficient documentation

## 2022-04-22 DIAGNOSIS — H5203 Hypermetropia, bilateral: Secondary | ICD-10-CM | POA: Diagnosis not present

## 2022-04-24 ENCOUNTER — Ambulatory Visit (INDEPENDENT_AMBULATORY_CARE_PROVIDER_SITE_OTHER): Payer: Medicare Other | Admitting: Internal Medicine

## 2022-04-24 ENCOUNTER — Encounter: Payer: Self-pay | Admitting: Internal Medicine

## 2022-04-24 VITALS — BP 128/72 | HR 71 | Temp 98.7°F | Ht 72.0 in | Wt 170.0 lb

## 2022-04-24 DIAGNOSIS — R1011 Right upper quadrant pain: Secondary | ICD-10-CM | POA: Diagnosis not present

## 2022-04-24 DIAGNOSIS — K219 Gastro-esophageal reflux disease without esophagitis: Secondary | ICD-10-CM | POA: Diagnosis not present

## 2022-04-24 DIAGNOSIS — R109 Unspecified abdominal pain: Secondary | ICD-10-CM | POA: Insufficient documentation

## 2022-04-24 LAB — URINALYSIS, ROUTINE W REFLEX MICROSCOPIC
Bilirubin Urine: NEGATIVE
Hgb urine dipstick: NEGATIVE
Ketones, ur: NEGATIVE
Leukocytes,Ua: NEGATIVE
Nitrite: NEGATIVE
RBC / HPF: NONE SEEN (ref 0–?)
Specific Gravity, Urine: <=1.005 — AB (ref 1.000–1.030)
Total Protein, Urine: NEGATIVE
Urine Glucose: NEGATIVE
Urobilinogen, UA: 0.2 (ref 0.0–1.0)
WBC, UA: NONE SEEN (ref 0–?)
pH: 6 (ref 5.0–8.0)

## 2022-04-24 LAB — CBC WITH DIFFERENTIAL/PLATELET
Basophils Absolute: 0 10*3/uL (ref 0.0–0.1)
Basophils Relative: 0.6 % (ref 0.0–3.0)
Eosinophils Absolute: 0.3 10*3/uL (ref 0.0–0.7)
Eosinophils Relative: 5.3 % — ABNORMAL HIGH (ref 0.0–5.0)
HCT: 45.4 % (ref 39.0–52.0)
Hemoglobin: 15.5 g/dL (ref 13.0–17.0)
Lymphocytes Relative: 19.1 % (ref 12.0–46.0)
Lymphs Abs: 1.2 10*3/uL (ref 0.7–4.0)
MCHC: 34.2 g/dL (ref 30.0–36.0)
MCV: 96.5 fl (ref 78.0–100.0)
Monocytes Absolute: 0.7 10*3/uL (ref 0.1–1.0)
Monocytes Relative: 11.2 % (ref 3.0–12.0)
Neutro Abs: 3.8 10*3/uL (ref 1.4–7.7)
Neutrophils Relative %: 63.8 % (ref 43.0–77.0)
Platelets: 194 10*3/uL (ref 150.0–400.0)
RBC: 4.7 Mil/uL (ref 4.22–5.81)
RDW: 13.7 % (ref 11.5–15.5)
WBC: 6 10*3/uL (ref 4.0–10.5)

## 2022-04-24 LAB — BASIC METABOLIC PANEL
BUN: 13 mg/dL (ref 6–23)
CO2: 27 mEq/L (ref 19–32)
Calcium: 9.3 mg/dL (ref 8.4–10.5)
Chloride: 102 mEq/L (ref 96–112)
Creatinine, Ser: 0.84 mg/dL (ref 0.40–1.50)
GFR: 87.31 mL/min (ref >60.00)
Glucose, Bld: 82 mg/dL (ref 70–99)
Potassium: 4.3 mEq/L (ref 3.5–5.1)
Sodium: 137 mEq/L (ref 135–145)

## 2022-04-24 LAB — HEPATIC FUNCTION PANEL
ALT: 14 U/L (ref 0–53)
AST: 20 U/L (ref 0–37)
Albumin: 4.2 g/dL (ref 3.5–5.2)
Alkaline Phosphatase: 60 U/L (ref 39–117)
Bilirubin, Direct: 0.1 mg/dL (ref 0.0–0.3)
Total Bilirubin: 0.6 mg/dL (ref 0.2–1.2)
Total Protein: 6.8 g/dL (ref 6.0–8.3)

## 2022-04-24 LAB — LIPASE: Lipase: 32 U/L (ref 11.0–59.0)

## 2022-04-24 NOTE — Assessment & Plan Note (Signed)
Overall stable, doubt related to current complaint

## 2022-04-24 NOTE — Assessment & Plan Note (Signed)
Etiology unclear or if related to right flank paravertebral pain which may be msk, for labs and CT as ordered today to r/o hepatic, GB, bowel, renal stone or pancreatic disorder

## 2022-04-24 NOTE — Assessment & Plan Note (Signed)
Etiology unclear or if related to RUQ pain, for labs and CT as ordered today to r/o hepatic, GB, bowel, renal stone or pancreatic disorder

## 2022-04-24 NOTE — Patient Instructions (Addendum)
Please continue all other medications as before, and refills have been done if requested.  Please have the pharmacy call with any other refills you may need.  Please continue your efforts at being more active, low cholesterol diet, and weight control.  Please keep your appointments with your specialists as you may have planned  You will be contacted regarding the referral for: CT scan - asap  Please go to the LAB at the blood drawing area for the tests to be done  You will be contacted by phone if any changes need to be made immediately.  Otherwise, you will receive a letter about your results with an explanation, but please check with MyChart first.  Please remember to sign up for MyChart if you have not done so, as this will be important to you in the future with finding out test results, communicating by private email, and scheduling acute appointments online when needed.  Please make an Appointment to return in 6 months, or sooner if needed

## 2022-04-24 NOTE — Progress Notes (Signed)
Patient ID: LYFE MONGER, male   DOB: Oct 10, 1950, 71 y.o.   MRN: [id number]        Chief Complaint: follow up RUQ pain       HPI:  NEVILLE WALSTON is a 71 y.o. male here with c/o new onset RUQ pain dull and pressure like with possible radiation at time to the mid thoracic right paravertebral area/ right flank. Denies worsening reflux, other abd pain, dysphagia, n/v, bowel change or blood.   Pt denies fever, wt loss, night sweats, loss of appetite, or other constitutional symptoms  Denies urinary symptoms such as dysuria, frequency, urgency, flank pain, hematuria.  No prior hx of hepatic, GB, PUD, bowel d/o, renal stones or pancreatitis.  Pt just returned from [place] x 2 wks with biking with friends but not competitive and no trauma, no accidents. Back in the [place] on June 26 for 5 days in [place] to visit family but no unusual other travel or activity.  Not currently taking anticoagulation with hx of PAF, has been following with cardiology.       Wt Readings from Last 3 Encounters:  04/24/22 170 lb (77.1 kg)  11/14/21 173 lb (78.5 kg)  10/17/21 173 lb (78.5 kg)   BP Readings from Last 3 Encounters:  04/24/22 128/72  11/14/21 108/62  10/17/21 112/68         Past Medical History:  Diagnosis Date   Erectile dysfunction 11/01/2019   Fracture    L arm @ 11; 3 ribs with shoulder separation & PTX 2003   Gastritis 09/13/2007   EGD   GERD (gastroesophageal reflux disease)    Hepatitis A 1972   from exposure to septic tank which malfunctioned   Internal hemorrhoids 09/13/2007   COLON   Osteoporosis    Paroxysmal atrial fibrillation (HCC)    chads2vasc score is 1   Past Surgical History:  Procedure Laterality Date   ATRIAL FIBRILLATION ABLATION N/A 06/14/2020   Procedure: ATRIAL FIBRILLATION ABLATION;  Surgeon: Thompson Grayer, MD;  Location: [facility];  Service: Cardiovascular;  Laterality: N/A;   cataract surgery  10/2014   COLONOSCOPY  2011   Dr Sharlett Iles   HERNIA REPAIR     SKIN  CANCER EXCISION  2011   R calf, Dr Jarome Matin   UPPER GASTROINTESTINAL ENDOSCOPY  2003 & 2008    H pylori 2008    reports that he quit smoking about 41 years ago. His smoking use included cigarettes. He quit smokeless tobacco use about 41 years ago. He reports that he does not drink alcohol and does not use drugs. family history includes Alcohol abuse in his paternal uncle; Colon cancer (age of onset: 42) in his father; Coronary artery disease in his father; Depression in his brother and mother; Emphysema in his father; Tuberculosis in his mother. Allergies  Allergen Reactions   Chlorine     EYES WATER/NASAL CONGESTION   Current Outpatient Medications on File Prior to Visit  Medication Sig Dispense Refill   acetaminophen (TYLENOL) 500 MG tablet Take 500 mg by mouth as needed for moderate pain or headache.      Ascorbic Acid (VITAMIN C) 1000 MG tablet Take 1,000 mg by mouth daily.     Carboxymethylcellulose Sodium (LUBRICANT EYE DROPS OP) Place 1 drop into both eyes 3 (three) times daily.     Cholecalciferol (VITAMIN D-3) 125 MCG (5000 UT) TABS Take 1 tablet by mouth daily. 90 tablet 3   dextromethorphan-guaiFENesin (MUCINEX DM) 30-600 MG 12hr  tablet Take 1 tablet by mouth as needed (congestion).      Flaxseed, Linseed, (FLAXSEED OIL) 1000 MG CAPS Take by mouth as directed.      sildenafil (VIAGRA) 100 MG tablet Take 1 tablet (100 mg total) by mouth daily as needed for erectile dysfunction. 10 tablet 3   vitamin E 180 MG (400 UNITS) capsule Take 400 Units by mouth daily.     No current facility-administered medications on file prior to visit.        ROS:  All others reviewed and negative.  Objective        PE:  BP 128/72 (BP Location: Right Arm, Patient Position: Sitting, Cuff Size: Large)   Pulse 71   Temp 98.7 F (37.1 C) (Oral)   Ht 6' (1.829 m)   Wt 170 lb (77.1 kg)   SpO2 98%   BMI 23.06 kg/m                 Constitutional: Pt appears in NAD               HENT: Head: NCAT.                 Right Ear: External ear normal.                 Left Ear: External ear normal.                Eyes: . Pupils are equal, round, and reactive to light. Conjunctivae and EOM are normal               Nose: without d/c or deformity               Neck: Neck supple. Gross normal ROM               Cardiovascular: Normal rate and regular rhythm.                 Pulmonary/Chest: Effort normal and breath sounds without rales or wheezing.                Abd:  Soft, NT, ND, + BS, no organomegaly, no flank tender - benign exam               Neurological: Pt is alert. At baseline orientation, motor grossly intact               Skin: Skin is warm. No rashes, no other new lesions, LE edema - none               Psychiatric: Pt behavior is normal without agitation   Micro: none  Cardiac tracings I have personally interpreted today:  none  Pertinent Radiological findings (summarize): none   Lab Results  Component Value Date   WBC 4.9 11/21/2021   HGB 14.7 11/21/2021   HCT 43.4 11/21/2021   PLT 204.0 11/21/2021   GLUCOSE 92 11/21/2021   CHOL 152 11/21/2021   TRIG 46.0 11/21/2021   HDL 60.10 11/21/2021   LDLCALC 83 11/21/2021   ALT 14 11/21/2021   AST 18 11/21/2021   NA 139 11/21/2021   K 4.4 11/21/2021   CL 104 11/21/2021   CREATININE 0.91 11/21/2021   BUN 13 11/21/2021   CO2 31 11/21/2021   TSH 2.80 11/21/2021   PSA 0.43 11/21/2021   Assessment/Plan:  DUSTAN HYAMS is a 71 y.o. White or Caucasian [1] male with  has a past medical history of Erectile dysfunction (11/01/2019),  Fracture, Gastritis (09/13/2007), GERD (gastroesophageal reflux disease), Hepatitis A (1972), Internal hemorrhoids (09/13/2007), Osteoporosis, and Paroxysmal atrial fibrillation ([place]).  RUQ pain Etiology unclear or if related to right flank paravertebral pain which may be msk, for labs and CT as ordered today to r/o hepatic, GB, bowel, renal stone or pancreatic disorder  Right flank pain Etiology  unclear or if related to RUQ pain, for labs and CT as ordered today to r/o hepatic, GB, bowel, renal stone or pancreatic disorder   Esophageal reflux Overall stable, doubt related to current complaint  Followup: No follow-ups on file.  Cathlean Cower, MD 04/24/2022 1:11 PM [facility]

## 2022-04-25 ENCOUNTER — Encounter: Payer: Self-pay | Admitting: Internal Medicine

## 2022-05-01 DIAGNOSIS — M25551 Pain in right hip: Secondary | ICD-10-CM | POA: Insufficient documentation

## 2022-05-05 ENCOUNTER — Ambulatory Visit
Admission: RE | Admit: 2022-05-05 | Discharge: 2022-05-05 | Disposition: A | Discharge status: Home / Self Care | Payer: Medicare Other | Source: Ambulatory Visit | Attending: Internal Medicine | Admitting: Internal Medicine

## 2022-05-05 DIAGNOSIS — R1011 Right upper quadrant pain: Secondary | ICD-10-CM

## 2022-05-06 ENCOUNTER — Other Ambulatory Visit: Payer: Self-pay | Admitting: Internal Medicine

## 2022-05-06 DIAGNOSIS — E7849 Other hyperlipidemia: Secondary | ICD-10-CM

## 2022-05-06 DIAGNOSIS — R739 Hyperglycemia, unspecified: Secondary | ICD-10-CM

## 2022-05-06 DIAGNOSIS — E559 Vitamin D deficiency, unspecified: Secondary | ICD-10-CM

## 2022-05-06 DIAGNOSIS — Z125 Encounter for screening for malignant neoplasm of prostate: Secondary | ICD-10-CM

## 2022-05-06 DIAGNOSIS — E538 Deficiency of other specified B group vitamins: Secondary | ICD-10-CM

## 2022-05-07 DIAGNOSIS — M7061 Trochanteric bursitis, right hip: Secondary | ICD-10-CM | POA: Diagnosis not present

## 2022-05-07 DIAGNOSIS — M25552 Pain in left hip: Secondary | ICD-10-CM | POA: Diagnosis not present

## 2022-05-07 DIAGNOSIS — M25551 Pain in right hip: Secondary | ICD-10-CM | POA: Diagnosis not present

## 2022-05-08 ENCOUNTER — Telehealth: Payer: Self-pay | Admitting: Internal Medicine

## 2022-05-08 NOTE — Telephone Encounter (Signed)
Pt is requesting a call to discuss the results to his CT Renal Joaquim Lai Study that was done on 05/05/22.  Please advise

## 2022-05-09 NOTE — Telephone Encounter (Signed)
Left message for patient to call me back. 

## 2022-05-12 NOTE — Telephone Encounter (Signed)
Pt is returning call to discuss CT results. Pt is requesting callback at [phone] or [phone].

## 2022-05-12 NOTE — Telephone Encounter (Signed)
Results discussed with patient

## 2022-05-12 NOTE — Telephone Encounter (Signed)
Patient called back again and wants you to call him with his results.

## 2022-05-20 DIAGNOSIS — K219 Gastro-esophageal reflux disease without esophagitis: Secondary | ICD-10-CM | POA: Diagnosis not present

## 2022-05-20 DIAGNOSIS — R1011 Right upper quadrant pain: Secondary | ICD-10-CM | POA: Diagnosis not present

## 2022-05-20 DIAGNOSIS — K5900 Constipation, unspecified: Secondary | ICD-10-CM | POA: Diagnosis not present

## 2022-05-23 ENCOUNTER — Other Ambulatory Visit (HOSPITAL_COMMUNITY): Payer: Self-pay | Admitting: Gastroenterology

## 2022-05-23 ENCOUNTER — Other Ambulatory Visit: Payer: Self-pay | Admitting: Gastroenterology

## 2022-05-23 DIAGNOSIS — R1011 Right upper quadrant pain: Secondary | ICD-10-CM

## 2022-06-06 ENCOUNTER — Encounter (HOSPITAL_COMMUNITY): Payer: Medicare Other

## 2022-06-06 ENCOUNTER — Encounter (HOSPITAL_COMMUNITY): Payer: Self-pay

## 2022-06-25 ENCOUNTER — Ambulatory Visit: Payer: Medicare Other | Admitting: Podiatry

## 2022-06-25 ENCOUNTER — Encounter: Payer: Self-pay | Admitting: Podiatry

## 2022-06-25 ENCOUNTER — Other Ambulatory Visit: Payer: Self-pay

## 2022-06-25 ENCOUNTER — Ambulatory Visit (INDEPENDENT_AMBULATORY_CARE_PROVIDER_SITE_OTHER): Payer: Medicare Other

## 2022-06-25 DIAGNOSIS — M2142 Flat foot [pes planus] (acquired), left foot: Secondary | ICD-10-CM

## 2022-06-25 DIAGNOSIS — M779 Enthesopathy, unspecified: Secondary | ICD-10-CM

## 2022-06-25 DIAGNOSIS — M2141 Flat foot [pes planus] (acquired), right foot: Secondary | ICD-10-CM | POA: Diagnosis not present

## 2022-06-25 DIAGNOSIS — M205X9 Other deformities of toe(s) (acquired), unspecified foot: Secondary | ICD-10-CM | POA: Diagnosis not present

## 2022-06-25 NOTE — Progress Notes (Signed)
Subjective:   Patient ID: Michael Lindsey, male   DOB: 71 y.o.   MRN: [id number]   HPI Patient presents stating he had orthotics from 20 years ago he needs new pairs and he does have a lot of hip pain and is wondering if his feet may be part of it with enlargement around the big toe joint both feet.  Patient does not smoke likes to be active   Review of Systems  All other systems reviewed and are negative.       Objective:  Physical Exam Vitals and nursing note reviewed.  Constitutional:      Appearance: He is well-developed.  Pulmonary:     Effort: Pulmonary effort is normal.  Musculoskeletal:        General: Normal range of motion.  Skin:    General: Skin is warm.  Neurological:     Mental Status: He is alert.     Neurovascular status intact muscle strength adequate range of motion within normal limits with patient found to have significant range of motion loss first MPJ bilateral with dorsal enlargement no crepitus of the joint minimal discomfort with moderate flatfoot deformity noted.  Patient has good digital perfusion well oriented x3 no limb length discrepancy noted     Assessment:  Structural changes with moderate flatfoot deformity and functional and structural hallux limitus deformity bilateral     Plan:  H&P reviewed condition and I went ahead today and I advised on long-term orthotics and we discussed with reverse Morton's extension and trying to lift the arch up better than done previously.  Patient is casted for functional orthotic devices  Elevation noted of the first metatarsal segment bilateral on x-ray light dorsal spurring and moderate reduction of the joint space

## 2022-06-26 ENCOUNTER — Encounter: Payer: Self-pay | Admitting: Internal Medicine

## 2022-06-26 ENCOUNTER — Ambulatory Visit (INDEPENDENT_AMBULATORY_CARE_PROVIDER_SITE_OTHER): Payer: Medicare Other

## 2022-06-26 ENCOUNTER — Ambulatory Visit (INDEPENDENT_AMBULATORY_CARE_PROVIDER_SITE_OTHER): Payer: Medicare Other | Admitting: Internal Medicine

## 2022-06-26 VITALS — BP 124/70 | HR 74 | Temp 97.6°F | Ht 72.0 in | Wt 166.0 lb

## 2022-06-26 DIAGNOSIS — M545 Low back pain, unspecified: Secondary | ICD-10-CM | POA: Diagnosis not present

## 2022-06-26 DIAGNOSIS — G8929 Other chronic pain: Secondary | ICD-10-CM | POA: Insufficient documentation

## 2022-06-26 DIAGNOSIS — M549 Dorsalgia, unspecified: Secondary | ICD-10-CM

## 2022-06-26 DIAGNOSIS — D1801 Hemangioma of skin and subcutaneous tissue: Secondary | ICD-10-CM | POA: Diagnosis not present

## 2022-06-26 DIAGNOSIS — L821 Other seborrheic keratosis: Secondary | ICD-10-CM | POA: Diagnosis not present

## 2022-06-26 DIAGNOSIS — L814 Other melanin hyperpigmentation: Secondary | ICD-10-CM | POA: Diagnosis not present

## 2022-06-26 DIAGNOSIS — Z85828 Personal history of other malignant neoplasm of skin: Secondary | ICD-10-CM | POA: Diagnosis not present

## 2022-06-26 DIAGNOSIS — M546 Pain in thoracic spine: Secondary | ICD-10-CM | POA: Diagnosis not present

## 2022-06-26 NOTE — Assessment & Plan Note (Signed)
Ok for plain films as requested, tylenol prn, declines other pain med, f/u chiropracter as planned, consider MRI if not resolving

## 2022-06-26 NOTE — Progress Notes (Signed)
Patient ID: Michael Lindsey, male   DOB: [birth year], 71 y.o.   MRN: [id number]        Chief Complaint: follow up low back and right hip pain       HPI:  Michael Lindsey is a 71 y.o. male here with c/o persistent mid and lower back and right hip pain difficult to treat, having seen ortho Dr Alvan Dame for cortisone to right hip.   Pain not improved, has also seen PT and chiropracter who suggests plain films to further investigate, then pt to f/u there.  Pt denies chest pain, increased sob or doe, wheezing, orthopnea, PND, increased LE swelling, palpitations, dizziness or syncope.   Pt denies polydipsia, polyuria, or new focal neuro s/s.    Pt denies fever, wt loss, night sweats, loss of appetite, or other constitutional symptoms   Has not had recent mri.        Wt Readings from Last 3 Encounters:  06/26/22 166 lb (75.3 kg)  04/24/22 170 lb (77.1 kg)  11/14/21 173 lb (78.5 kg)   BP Readings from Last 3 Encounters:  06/26/22 124/70  04/24/22 128/72  11/14/21 108/62         Past Medical History:  Diagnosis Date   Erectile dysfunction 11/01/2019   Fracture    L arm @ 11; 3 ribs with shoulder separation & PTX 2003   Gastritis 09/13/2007   EGD   GERD (gastroesophageal reflux disease)    Hepatitis A 1972   from exposure to septic tank which malfunctioned   Internal hemorrhoids 09/13/2007   COLON   Osteoporosis    Paroxysmal atrial fibrillation (HCC)    chads2vasc score is 1   Past Surgical History:  Procedure Laterality Date   ATRIAL FIBRILLATION ABLATION N/A 06/14/2020   Procedure: ATRIAL FIBRILLATION ABLATION;  Surgeon: Thompson Grayer, MD;  Location: [facility];  Service: Cardiovascular;  Laterality: N/A;   cataract surgery  10/2014   COLONOSCOPY  2011   Dr Sharlett Iles   HERNIA REPAIR     SKIN CANCER EXCISION  2011   R calf, Dr Jarome Matin   UPPER GASTROINTESTINAL ENDOSCOPY  2003 & 2008    H pylori 2008    reports that he quit smoking about 41 years ago. His smoking use included  cigarettes. He quit smokeless tobacco use about 41 years ago. He reports that he does not drink alcohol and does not use drugs. family history includes Alcohol abuse in his paternal uncle; Colon cancer (age of onset: 40) in his father; Coronary artery disease in his father; Depression in his brother and mother; Emphysema in his father; Tuberculosis in his mother. Allergies  Allergen Reactions   Chlorine     EYES WATER/NASAL CONGESTION   Current Outpatient Medications on File Prior to Visit  Medication Sig Dispense Refill   acetaminophen (TYLENOL) 500 MG tablet Take 500 mg by mouth as needed for moderate pain or headache.      Ascorbic Acid (VITAMIN C) 1000 MG tablet Take 1,000 mg by mouth daily.     Carboxymethylcellulose Sodium (LUBRICANT EYE DROPS OP) Place 1 drop into both eyes 3 (three) times daily.     Cholecalciferol (VITAMIN D-3) 125 MCG (5000 UT) TABS Take 1 tablet by mouth daily. 90 tablet 3   dextromethorphan-guaiFENesin (MUCINEX DM) 30-600 MG 12hr tablet Take 1 tablet by mouth as needed (congestion).      Flaxseed, Linseed, (FLAXSEED OIL) 1000 MG CAPS Take by mouth as directed.  sildenafil (VIAGRA) 100 MG tablet Take 1 tablet (100 mg total) by mouth daily as needed for erectile dysfunction. 10 tablet 3   vitamin E 180 MG (400 UNITS) capsule Take 400 Units by mouth daily.     No current facility-administered medications on file prior to visit.        ROS:  All others reviewed and negative.  Objective        PE:  BP 124/70 (BP Location: Right Arm, Patient Position: Sitting, Cuff Size: Large)   Pulse 74   Temp 97.6 F (36.4 C) (Oral)   Ht 6' (1.829 m)   Wt 166 lb (75.3 kg)   SpO2 95%   BMI 22.51 kg/m                 Constitutional: Pt appears in NAD               HENT: Head: NCAT.                Right Ear: External ear normal.                 Left Ear: External ear normal.                Eyes: . Pupils are equal, round, and reactive to light. Conjunctivae and EOM are  normal               Nose: without d/c or deformity               Neck: Neck supple. Gross normal ROM               Cardiovascular: Normal rate and regular rhythm.                 Pulmonary/Chest: Effort normal and breath sounds without rales or wheezing.                Spine: diffuse mild tender in midline               Neurological: Pt is alert. At baseline orientation, motor grossly intact               Skin: Skin is warm. No rashes, no other new lesions, LE edema - none               Psychiatric: Pt behavior is normal without agitation   Micro: none  Cardiac tracings I have personally interpreted today:  none  Pertinent Radiological findings (summarize): none   Lab Results  Component Value Date   WBC 6.0 04/24/2022   HGB 15.5 04/24/2022   HCT 45.4 04/24/2022   PLT 194.0 04/24/2022   GLUCOSE 82 04/24/2022   CHOL 152 11/21/2021   TRIG 46.0 11/21/2021   HDL 60.10 11/21/2021   LDLCALC 83 11/21/2021   ALT 14 04/24/2022   AST 20 04/24/2022   NA 137 04/24/2022   K 4.3 04/24/2022   CL 102 04/24/2022   CREATININE 0.84 04/24/2022   BUN 13 04/24/2022   CO2 27 04/24/2022   TSH 2.80 11/21/2021   PSA 0.43 11/21/2021   Assessment/Plan:  Michael Lindsey is a 71 y.o. White or Caucasian [1] male with  has a past medical history of Erectile dysfunction (11/01/2019), Fracture, Gastritis (09/13/2007), GERD (gastroesophageal reflux disease), Hepatitis A (1972), Internal hemorrhoids (09/13/2007), Osteoporosis, and Paroxysmal atrial fibrillation ([place]).  Chronic midline back pain Ok for plain films as requested, tylenol prn, declines other pain med, f/u chiropracter as planned,  consider MRI if not resolving  Followup: Return if symptoms worsen or fail to improve.  Cathlean Cower, MD 06/26/2022 1:03 PM [facility]

## 2022-06-26 NOTE — Patient Instructions (Signed)
Please continue all other medications as before  Please have the pharmacy call with any other refills you may need.  Please continue your efforts at being more active, low cholesterol diet, and weight control.  Please keep your appointments with your specialists as you may have planned  Please go to the XRAY Department in the first floor for the x-ray testing  You will be contacted by phone if any changes need to be made immediately.  Otherwise, you will receive a letter about your results with an explanation, but please check with MyChart first.  Please remember to sign up for MyChart if you have not done so, as this will be important to you in the future with finding out test results, communicating by private email, and scheduling acute appointments online when needed.

## 2022-06-27 DIAGNOSIS — M5431 Sciatica, right side: Secondary | ICD-10-CM | POA: Diagnosis not present

## 2022-06-27 DIAGNOSIS — M9905 Segmental and somatic dysfunction of pelvic region: Secondary | ICD-10-CM | POA: Diagnosis not present

## 2022-06-27 DIAGNOSIS — S338XXA Sprain of other parts of lumbar spine and pelvis, initial encounter: Secondary | ICD-10-CM | POA: Diagnosis not present

## 2022-06-27 DIAGNOSIS — M9903 Segmental and somatic dysfunction of lumbar region: Secondary | ICD-10-CM | POA: Diagnosis not present

## 2022-07-02 DIAGNOSIS — M5431 Sciatica, right side: Secondary | ICD-10-CM | POA: Diagnosis not present

## 2022-07-02 DIAGNOSIS — M9905 Segmental and somatic dysfunction of pelvic region: Secondary | ICD-10-CM | POA: Diagnosis not present

## 2022-07-02 DIAGNOSIS — M9903 Segmental and somatic dysfunction of lumbar region: Secondary | ICD-10-CM | POA: Diagnosis not present

## 2022-07-02 DIAGNOSIS — S338XXA Sprain of other parts of lumbar spine and pelvis, initial encounter: Secondary | ICD-10-CM | POA: Diagnosis not present

## 2022-07-09 DIAGNOSIS — M9903 Segmental and somatic dysfunction of lumbar region: Secondary | ICD-10-CM | POA: Diagnosis not present

## 2022-07-09 DIAGNOSIS — S338XXA Sprain of other parts of lumbar spine and pelvis, initial encounter: Secondary | ICD-10-CM | POA: Diagnosis not present

## 2022-07-09 DIAGNOSIS — M5431 Sciatica, right side: Secondary | ICD-10-CM | POA: Diagnosis not present

## 2022-07-09 DIAGNOSIS — M9905 Segmental and somatic dysfunction of pelvic region: Secondary | ICD-10-CM | POA: Diagnosis not present

## 2022-07-10 ENCOUNTER — Ambulatory Visit (HOSPITAL_COMMUNITY)
Admission: EM | Admit: 2022-07-10 | Discharge: 2022-07-10 | Disposition: A | Discharge status: Home / Self Care | Payer: Medicare Other | Attending: Internal Medicine | Admitting: Internal Medicine

## 2022-07-10 ENCOUNTER — Encounter (HOSPITAL_COMMUNITY): Payer: Self-pay | Admitting: Emergency Medicine

## 2022-07-10 DIAGNOSIS — R21 Rash and other nonspecific skin eruption: Secondary | ICD-10-CM

## 2022-07-10 MED ORDER — DOXYCYCLINE HYCLATE 100 MG PO CAPS: 200.0000 mg | ORAL_CAPSULE | Freq: Once | ORAL | 0 refills | Status: AC

## 2022-07-10 NOTE — Discharge Instructions (Addendum)
Please take medications as prescribed If you have worsening symptoms i.e. rash, flulike symptoms, fever or chills-please return to urgent care to be reevaluated.

## 2022-07-10 NOTE — ED Triage Notes (Signed)
Pt reports that last weekend walked in park with wife. Tuesday noticed itching to face and spots on arms. Reports noticed spot on left arm today that looks similar to when was bit from tick and spot similar. Denies seeing any ticks or bugs on him. Took Benadryl Tuesday and Wed which helped symptoms.

## 2022-07-10 NOTE — ED Provider Notes (Signed)
[facility]    CSN: [id number] Arrival date & time: 07/10/22  1645      History   Chief Complaint Chief Complaint  Patient presents with   Rash    HPI Michael Lindsey is a 71 y.o. male comes to urgent care with a rash on the lower half of his face, torso and a papular rash on the left forearm.  Patient walked in the pack over the past weekend and few days after that he noticed a rash on the face, the left arm and over the abdomen.  The rash is itchy.  Patient denies any generalized body aches or flulike symptoms.  He did not see a tick bite but he was concerned about a tick bite.  He has suffered a tickborne illness in the past.  Patient took a dose of Benadryl which helped his symptoms.  No nausea, vomiting, fever or diarrhea.  No sick contacts.Marland Kitchen   HPI  Past Medical History:  Diagnosis Date   Erectile dysfunction 11/01/2019   Fracture    L arm @ 11; 3 ribs with shoulder separation & PTX 2003   Gastritis 09/13/2007   EGD   GERD (gastroesophageal reflux disease)    Hepatitis A 1972   from exposure to septic tank which malfunctioned   Internal hemorrhoids 09/13/2007   COLON   Osteoporosis    Paroxysmal atrial fibrillation (HCC)    chads2vasc score is 1    Patient Active Problem List   Diagnosis Date Noted   Chronic midline back pain 06/26/2022   RUQ pain 04/24/2022   Right flank pain 04/24/2022   Sensorineural hearing loss (SNHL), bilateral 04/03/2022   Tinnitus of both ears 04/03/2022   Acute dysfunction of right eustachian tube 10/24/2021   Throat pain 02/15/2020   Erectile dysfunction 11/01/2019   Chronic anticoagulation 11/01/2019   Pain in left knee 05/03/2019   Bilateral primary osteoarthritis of knee 06/18/2018   Leg pain 03/23/2018   PAF (paroxysmal atrial fibrillation) ([place]) 11/17/2016   Encounter for well adult exam with abnormal findings 10/20/2016   Palpitations 09/24/2016   Dyspnea 09/24/2016   Eczema 11/03/2013   Allergic rhinitis  06/01/2013   Skin cancer 08/11/2012   Dysphagia 12/08/2007   Osteopenia 09/14/2007   Esophageal reflux 04/13/2007    Past Surgical History:  Procedure Laterality Date   ATRIAL FIBRILLATION ABLATION N/A 06/14/2020   Procedure: ATRIAL FIBRILLATION ABLATION;  Surgeon: Thompson Grayer, MD;  Location: [facility];  Service: Cardiovascular;  Laterality: N/A;   cataract surgery  10/2014   COLONOSCOPY  2011   Dr Sharlett Iles   HERNIA REPAIR     SKIN CANCER EXCISION  2011   R calf, Dr Jarome Matin   UPPER GASTROINTESTINAL ENDOSCOPY  2003 & 2008    H pylori 2008       Home Medications    Prior to Admission medications   Medication Sig Start Date End Date Taking? Authorizing Provider  doxycycline (VIBRAMYCIN) 100 MG capsule Take 2 capsules (200 mg total) by mouth once for 1 dose. 07/10/22 07/10/22 Yes Damari Hiltz, Myrene Galas, MD  acetaminophen (TYLENOL) 500 MG tablet Take 500 mg by mouth as needed for moderate pain or headache.     [provider]  Ascorbic Acid (VITAMIN C) 1000 MG tablet Take 1,000 mg by mouth daily.    [provider]  Carboxymethylcellulose Sodium (LUBRICANT EYE DROPS OP) Place 1 drop into both eyes 3 (three) times daily.    [provider]  Cholecalciferol (VITAMIN D-3) 125 MCG (5000 UT) TABS Take 1 tablet by mouth daily. 04/24/21   Hilts, Legrand Como, MD  dextromethorphan-guaiFENesin [facility] DM) 30-600 MG 12hr tablet Take 1 tablet by mouth as needed (congestion).     [provider]  Flaxseed, Linseed, (FLAXSEED OIL) 1000 MG CAPS Take by mouth as directed.     [provider]  sildenafil (VIAGRA) 100 MG tablet Take 1 tablet (100 mg total) by mouth daily as needed for erectile dysfunction. 06/25/21   Jettie Booze, MD  vitamin E 180 MG (400 UNITS) capsule Take 400 Units by mouth daily.    [provider]    Family History Family History  Problem Relation Age of Onset   Depression Mother    Tuberculosis Mother     Coronary artery disease Father    Emphysema Father    Colon cancer Father 45   Depression Brother    Alcohol abuse Paternal Uncle    Diabetes Neg Hx    Stroke Neg Hx     Social History Social History   Tobacco Use   Smoking status: Former    Types: Cigarettes    Quit date: 10/06/1980    Years since quitting: 41.7   Smokeless tobacco: Former    Quit date: 1982   Tobacco comments:    intermittent smoker over 4 years in high school & again in 1983 before quitting, never > 2 mos @ a time,never > 1/2 ppd  Vaping Use   Vaping Use: Never used  Substance Use Topics   Alcohol use: No    Comment: SOCIALLY,   Drug use: No     Allergies   Chlorine   Review of Systems Review of Systems As per HPI  Physical Exam Triage Vital Signs ED Triage Vitals  Enc Vitals Group     BP 07/10/22 1706 119/84     Pulse Rate 07/10/22 1706 74     Resp 07/10/22 1706 16     Temp 07/10/22 1706 98.1 F (36.7 C)     Temp Source 07/10/22 1706 Oral     SpO2 07/10/22 1706 100 %     Weight --      Height --      Head Circumference --      Peak Flow --      Pain Score 07/10/22 1705 0     Pain Loc --      Pain Edu? --      Excl. in [place]? --    No data found.  Updated Vital Signs BP 119/84 (BP Location: Right Arm)   Pulse 74   Temp 98.1 F (36.7 C) (Oral)   Resp 16   SpO2 100%   Visual Acuity Right Eye Distance:   Left Eye Distance:   Bilateral Distance:    Right Eye Near:   Left Eye Near:    Bilateral Near:     Physical Exam Vitals and nursing note reviewed.  Constitutional:      Appearance: Normal appearance.  Musculoskeletal:     Cervical back: Normal range of motion.  Skin:    Comments: Papular rash over the left antecubital fossa.  Papular lesion is surrounded by a ring of erythema.  No rash over the face.  Neurological:     General: No focal deficit present.     Mental Status: He is alert and oriented to person, place, and time.      UC Treatments / Results   Labs (all labs  ordered are listed, but only abnormal results are displayed) Labs Reviewed - No data to display  EKG   Radiology No results found.  Procedures Procedures (including critical care time)  Medications Ordered in UC Medications - No data to display  Initial Impression / Assessment and Plan / UC Course  I have reviewed the triage vital signs and the nursing notes.  Pertinent labs & imaging results that were available during my care of the patient were reviewed by me and considered in my medical decision making (see chart for details).     1.  Rash: Patient is concerned about tick bite. I prescribed doxycycline 200 mg x 1 dose after I had a discussion with the patient. He agrees with the option of 1 dose of doxycycline. Return precautions given Reassurance given to the patient. Final Clinical Impressions(s) / UC Diagnoses   Final diagnoses:  Rash     Discharge Instructions      Please take medications as prescribed If you have worsening symptoms i.e. rash, flulike symptoms, fever or chills-please return to urgent care to be reevaluated.   ED Prescriptions     Medication Sig Dispense Auth. Provider   doxycycline (VIBRAMYCIN) 100 MG capsule Take 2 capsules (200 mg total) by mouth once for 1 dose. 2 capsule Olando Willems, Myrene Galas, MD      PDMP not reviewed this encounter.   Chase Picket, MD 07/11/22 1057

## 2022-07-11 DIAGNOSIS — M9905 Segmental and somatic dysfunction of pelvic region: Secondary | ICD-10-CM | POA: Diagnosis not present

## 2022-07-11 DIAGNOSIS — S338XXA Sprain of other parts of lumbar spine and pelvis, initial encounter: Secondary | ICD-10-CM | POA: Diagnosis not present

## 2022-07-11 DIAGNOSIS — M9903 Segmental and somatic dysfunction of lumbar region: Secondary | ICD-10-CM | POA: Diagnosis not present

## 2022-07-11 DIAGNOSIS — M5431 Sciatica, right side: Secondary | ICD-10-CM | POA: Diagnosis not present

## 2022-07-14 DIAGNOSIS — M9905 Segmental and somatic dysfunction of pelvic region: Secondary | ICD-10-CM | POA: Diagnosis not present

## 2022-07-14 DIAGNOSIS — M9903 Segmental and somatic dysfunction of lumbar region: Secondary | ICD-10-CM | POA: Diagnosis not present

## 2022-07-14 DIAGNOSIS — S338XXA Sprain of other parts of lumbar spine and pelvis, initial encounter: Secondary | ICD-10-CM | POA: Diagnosis not present

## 2022-07-14 DIAGNOSIS — M5431 Sciatica, right side: Secondary | ICD-10-CM | POA: Diagnosis not present

## 2022-07-17 DIAGNOSIS — M9905 Segmental and somatic dysfunction of pelvic region: Secondary | ICD-10-CM | POA: Diagnosis not present

## 2022-07-17 DIAGNOSIS — M5431 Sciatica, right side: Secondary | ICD-10-CM | POA: Diagnosis not present

## 2022-07-17 DIAGNOSIS — S338XXA Sprain of other parts of lumbar spine and pelvis, initial encounter: Secondary | ICD-10-CM | POA: Diagnosis not present

## 2022-07-17 DIAGNOSIS — M9903 Segmental and somatic dysfunction of lumbar region: Secondary | ICD-10-CM | POA: Diagnosis not present

## 2022-07-18 DIAGNOSIS — H900 Conductive hearing loss, bilateral: Secondary | ICD-10-CM | POA: Diagnosis not present

## 2022-07-23 DIAGNOSIS — M9905 Segmental and somatic dysfunction of pelvic region: Secondary | ICD-10-CM | POA: Diagnosis not present

## 2022-07-23 DIAGNOSIS — M5431 Sciatica, right side: Secondary | ICD-10-CM | POA: Diagnosis not present

## 2022-07-23 DIAGNOSIS — S338XXA Sprain of other parts of lumbar spine and pelvis, initial encounter: Secondary | ICD-10-CM | POA: Diagnosis not present

## 2022-07-23 DIAGNOSIS — M9903 Segmental and somatic dysfunction of lumbar region: Secondary | ICD-10-CM | POA: Diagnosis not present

## 2022-07-24 ENCOUNTER — Ambulatory Visit (INDEPENDENT_AMBULATORY_CARE_PROVIDER_SITE_OTHER): Payer: Medicare Other | Admitting: *Deleted

## 2022-07-24 DIAGNOSIS — M2141 Flat foot [pes planus] (acquired), right foot: Secondary | ICD-10-CM

## 2022-07-24 DIAGNOSIS — M2142 Flat foot [pes planus] (acquired), left foot: Secondary | ICD-10-CM

## 2022-07-24 NOTE — Progress Notes (Signed)
Patient presents today to pick up custom molded foot orthotics, diagnosed with pes planus by Dr. Paulla Dolly.   Orthotics were dispensed and fit was satisfactory. Reviewed instructions for break-in and wear. Written instructions given to patient.  Patient will follow up as needed.   [facility] - order # [id number]

## 2022-08-08 ENCOUNTER — Ambulatory Visit: Payer: Medicare Other | Admitting: Internal Medicine

## 2022-08-13 DIAGNOSIS — M9902 Segmental and somatic dysfunction of thoracic region: Secondary | ICD-10-CM | POA: Diagnosis not present

## 2022-08-13 DIAGNOSIS — M9903 Segmental and somatic dysfunction of lumbar region: Secondary | ICD-10-CM | POA: Diagnosis not present

## 2022-08-13 DIAGNOSIS — M9905 Segmental and somatic dysfunction of pelvic region: Secondary | ICD-10-CM | POA: Diagnosis not present

## 2022-08-13 DIAGNOSIS — S338XXA Sprain of other parts of lumbar spine and pelvis, initial encounter: Secondary | ICD-10-CM | POA: Diagnosis not present

## 2022-09-03 DIAGNOSIS — R109 Unspecified abdominal pain: Secondary | ICD-10-CM | POA: Diagnosis not present

## 2022-09-18 DIAGNOSIS — M4316 Spondylolisthesis, lumbar region: Secondary | ICD-10-CM | POA: Diagnosis not present

## 2022-09-22 DIAGNOSIS — M9903 Segmental and somatic dysfunction of lumbar region: Secondary | ICD-10-CM | POA: Diagnosis not present

## 2022-09-22 DIAGNOSIS — M9905 Segmental and somatic dysfunction of pelvic region: Secondary | ICD-10-CM | POA: Diagnosis not present

## 2022-09-22 DIAGNOSIS — M9902 Segmental and somatic dysfunction of thoracic region: Secondary | ICD-10-CM | POA: Diagnosis not present

## 2022-09-22 DIAGNOSIS — S338XXA Sprain of other parts of lumbar spine and pelvis, initial encounter: Secondary | ICD-10-CM | POA: Diagnosis not present

## 2022-09-24 DIAGNOSIS — S338XXA Sprain of other parts of lumbar spine and pelvis, initial encounter: Secondary | ICD-10-CM | POA: Diagnosis not present

## 2022-09-24 DIAGNOSIS — M9905 Segmental and somatic dysfunction of pelvic region: Secondary | ICD-10-CM | POA: Diagnosis not present

## 2022-09-24 DIAGNOSIS — M9903 Segmental and somatic dysfunction of lumbar region: Secondary | ICD-10-CM | POA: Diagnosis not present

## 2022-09-24 DIAGNOSIS — M4316 Spondylolisthesis, lumbar region: Secondary | ICD-10-CM | POA: Diagnosis not present

## 2022-09-24 DIAGNOSIS — M9902 Segmental and somatic dysfunction of thoracic region: Secondary | ICD-10-CM | POA: Diagnosis not present

## 2022-09-24 DIAGNOSIS — M5126 Other intervertebral disc displacement, lumbar region: Secondary | ICD-10-CM | POA: Diagnosis not present

## 2022-09-25 DIAGNOSIS — M5416 Radiculopathy, lumbar region: Secondary | ICD-10-CM | POA: Diagnosis not present

## 2022-10-03 DIAGNOSIS — S338XXA Sprain of other parts of lumbar spine and pelvis, initial encounter: Secondary | ICD-10-CM | POA: Diagnosis not present

## 2022-10-03 DIAGNOSIS — M9905 Segmental and somatic dysfunction of pelvic region: Secondary | ICD-10-CM | POA: Diagnosis not present

## 2022-10-03 DIAGNOSIS — M9903 Segmental and somatic dysfunction of lumbar region: Secondary | ICD-10-CM | POA: Diagnosis not present

## 2022-10-03 DIAGNOSIS — M9902 Segmental and somatic dysfunction of thoracic region: Secondary | ICD-10-CM | POA: Diagnosis not present

## 2022-10-07 NOTE — Progress Notes (Signed)
PCP:  Biagio Borg, MD Primary Cardiologist: None Electrophysiologist: Dr. Rayann Heman ->  Dr. Gabriel Cirri Michael Lindsey is a 72 y.o. male seen today for Thompson Grayer, MD for routine electrophysiology followup. Since last being seen in our clinic the patient reports doing well overall.  he denies chest pain, palpitations, dyspnea, PND, orthopnea, nausea, vomiting, dizziness, syncope, edema, weight gain, or early satiety.  He wears his apple watch daily and has not had any AF alerts.  Past Medical History:  Diagnosis Date   Erectile dysfunction 11/01/2019   Fracture    L arm @ 10; 3 ribs with shoulder separation & PTX 2003   Gastritis 09/13/2007   EGD   GERD (gastroesophageal reflux disease)    Hepatitis A 1972   from exposure to septic tank which malfunctioned   Internal hemorrhoids 09/13/2007   COLON   Osteoporosis    Paroxysmal atrial fibrillation (HCC)    chads2vasc score is 1   Past Surgical History:  Procedure Laterality Date   ATRIAL FIBRILLATION ABLATION N/A 06/14/2020   Procedure: ATRIAL FIBRILLATION ABLATION;  Surgeon: Thompson Grayer, MD;  Location: [facility];  Service: Cardiovascular;  Laterality: N/A;   cataract surgery  10/2014   COLONOSCOPY  2011   Dr Sharlett Iles   HERNIA REPAIR     SKIN CANCER EXCISION  2011   R calf, Dr Jarome Matin   UPPER GASTROINTESTINAL ENDOSCOPY  2003 & 2008    H pylori 2008    Current Outpatient Medications  Medication Sig Dispense Refill   acetaminophen (TYLENOL) 500 MG tablet Take 500 mg by mouth as needed for moderate pain or headache.      Ascorbic Acid (VITAMIN C) 1000 MG tablet Take 1,000 mg by mouth daily.     Carboxymethylcellulose Sodium (LUBRICANT EYE DROPS OP) Place 1 drop into both eyes 3 (three) times daily.     celecoxib (CELEBREX) 200 MG capsule Take 200 mg by mouth daily.     Cholecalciferol (VITAMIN D-3) 125 MCG (5000 UT) TABS Take 1 tablet by mouth daily. 90 tablet 3   dextromethorphan-guaiFENesin (MUCINEX DM) 30-600  MG 12hr tablet Take 1 tablet by mouth as needed (congestion).      Flaxseed, Linseed, (FLAXSEED OIL) 1000 MG CAPS Take by mouth as directed.      sildenafil (VIAGRA) 100 MG tablet Take 1 tablet (100 mg total) by mouth daily as needed for erectile dysfunction. 10 tablet 3   vitamin E 180 MG (400 UNITS) capsule Take 400 Units by mouth daily.     No current facility-administered medications for this visit.    Allergies  Allergen Reactions   Chlorine     EYES WATER/NASAL CONGESTION   Physical Exam: Vitals:   10/13/22 1210  BP: 114/72  Pulse: 70  SpO2: 99%  Weight: 165 lb (74.8 kg)  Height: 6' (1.829 m)    GEN- The patient is well appearing, alert and oriented x 3 today.   HEENT: normocephalic, atraumatic; sclera clear, conjunctiva pink; hearing intact; oropharynx clear; neck supple, no JVP Lymph- no cervical lymphadenopathy Lungs- Clear to ausculation bilaterally, normal work of breathing.  No wheezes, rales, rhonchi Heart- Regular rate and rhythm, no murmurs, rubs or gallops, PMI not laterally displaced GI- soft, non-tender, non-distended, bowel sounds present, no hepatosplenomegaly Extremities- No peripheral edema. no clubbing or cyanosis; DP/PT/radial pulses 2+ bilaterally MS- no significant deformity or atrophy Skin- warm and dry, no rash or lesion Psych- euthymic mood, full affect Neuro- strength and sensation  are intact  EKG is ordered. Personal review of EKG from today shows NSR at 70 bpm  Additional studies reviewed include: Previous EP notes.   Assessment and Plan:  1. Paroxysmal atrial fibrillation EKG today shows NSR at 70 bpm Doing very well post ablation off AAD therapy CHA2DS2VASC is currently only 1.  Not on [place] per guidelines.  Continue apple watch. We set up AF history/monitoring on his phone.   Follow up with Dr. Curt Bears in 12 months  Shirley Friar, [place]  10/13/22 12:18 PM

## 2022-10-08 DIAGNOSIS — M9902 Segmental and somatic dysfunction of thoracic region: Secondary | ICD-10-CM | POA: Diagnosis not present

## 2022-10-08 DIAGNOSIS — M9905 Segmental and somatic dysfunction of pelvic region: Secondary | ICD-10-CM | POA: Diagnosis not present

## 2022-10-08 DIAGNOSIS — S338XXA Sprain of other parts of lumbar spine and pelvis, initial encounter: Secondary | ICD-10-CM | POA: Diagnosis not present

## 2022-10-08 DIAGNOSIS — M9903 Segmental and somatic dysfunction of lumbar region: Secondary | ICD-10-CM | POA: Diagnosis not present

## 2022-10-13 ENCOUNTER — Encounter: Payer: Self-pay | Admitting: Student

## 2022-10-13 ENCOUNTER — Ambulatory Visit: Payer: Medicare Other | Attending: Student | Admitting: Student

## 2022-10-13 VITALS — BP 114/72 | HR 70 | Ht 72.0 in | Wt 165.0 lb

## 2022-10-13 DIAGNOSIS — I480 Paroxysmal atrial fibrillation: Secondary | ICD-10-CM | POA: Diagnosis not present

## 2022-10-13 NOTE — Patient Instructions (Signed)
Medication Instructions:  Your physician recommends that you continue on your current medications as directed. Please refer to the Current Medication list given to you today.  *If you need a refill on your cardiac medications before your next appointment, please call your pharmacy*   Lab Work: None If you have labs (blood work) drawn today and your tests are completely normal, you will receive your results only by: [place] (if you have MyChart) OR A paper copy in the mail If you have any lab test that is abnormal or we need to change your treatment, we will call you to review the results.   Follow-Up: At [facility], you and your health needs are our priority.  As part of our continuing mission to provide you with exceptional heart care, we have created designated Provider Care Teams.  These Care Teams include your primary Cardiologist (physician) and Advanced Practice Providers (APPs -  Physician Assistants and Nurse Practitioners) who all work together to provide you with the care you need, when you need it.   Your next appointment:   1 year(s)  The format for your next appointment:   In Person  Provider:   Lars Mage, MD     Important Information About Sugar

## 2022-10-15 DIAGNOSIS — M9905 Segmental and somatic dysfunction of pelvic region: Secondary | ICD-10-CM | POA: Diagnosis not present

## 2022-10-15 DIAGNOSIS — M9902 Segmental and somatic dysfunction of thoracic region: Secondary | ICD-10-CM | POA: Diagnosis not present

## 2022-10-15 DIAGNOSIS — S338XXA Sprain of other parts of lumbar spine and pelvis, initial encounter: Secondary | ICD-10-CM | POA: Diagnosis not present

## 2022-10-15 DIAGNOSIS — M9903 Segmental and somatic dysfunction of lumbar region: Secondary | ICD-10-CM | POA: Diagnosis not present

## 2022-10-17 DIAGNOSIS — S338XXA Sprain of other parts of lumbar spine and pelvis, initial encounter: Secondary | ICD-10-CM | POA: Diagnosis not present

## 2022-10-17 DIAGNOSIS — M9903 Segmental and somatic dysfunction of lumbar region: Secondary | ICD-10-CM | POA: Diagnosis not present

## 2022-10-17 DIAGNOSIS — M9905 Segmental and somatic dysfunction of pelvic region: Secondary | ICD-10-CM | POA: Diagnosis not present

## 2022-10-17 DIAGNOSIS — M9902 Segmental and somatic dysfunction of thoracic region: Secondary | ICD-10-CM | POA: Diagnosis not present

## 2022-10-22 DIAGNOSIS — M4316 Spondylolisthesis, lumbar region: Secondary | ICD-10-CM | POA: Diagnosis not present

## 2022-10-23 DIAGNOSIS — M4316 Spondylolisthesis, lumbar region: Secondary | ICD-10-CM | POA: Diagnosis not present

## 2022-10-23 DIAGNOSIS — M545 Low back pain, unspecified: Secondary | ICD-10-CM | POA: Diagnosis not present

## 2022-10-23 DIAGNOSIS — K080 Exfoliation of teeth due to systemic causes: Secondary | ICD-10-CM | POA: Diagnosis not present

## 2022-10-27 ENCOUNTER — Ambulatory Visit: Payer: Medicare Other | Admitting: Internal Medicine

## 2022-11-03 DIAGNOSIS — M545 Low back pain, unspecified: Secondary | ICD-10-CM | POA: Diagnosis not present

## 2022-11-03 DIAGNOSIS — M4316 Spondylolisthesis, lumbar region: Secondary | ICD-10-CM | POA: Diagnosis not present

## 2022-11-05 DIAGNOSIS — M9903 Segmental and somatic dysfunction of lumbar region: Secondary | ICD-10-CM | POA: Diagnosis not present

## 2022-11-05 DIAGNOSIS — S338XXA Sprain of other parts of lumbar spine and pelvis, initial encounter: Secondary | ICD-10-CM | POA: Diagnosis not present

## 2022-11-05 DIAGNOSIS — M9905 Segmental and somatic dysfunction of pelvic region: Secondary | ICD-10-CM | POA: Diagnosis not present

## 2022-11-05 DIAGNOSIS — M9902 Segmental and somatic dysfunction of thoracic region: Secondary | ICD-10-CM | POA: Diagnosis not present

## 2022-11-06 DIAGNOSIS — M545 Low back pain, unspecified: Secondary | ICD-10-CM | POA: Diagnosis not present

## 2022-11-06 DIAGNOSIS — M4316 Spondylolisthesis, lumbar region: Secondary | ICD-10-CM | POA: Diagnosis not present

## 2022-11-10 DIAGNOSIS — M545 Low back pain, unspecified: Secondary | ICD-10-CM | POA: Diagnosis not present

## 2022-11-10 DIAGNOSIS — M4316 Spondylolisthesis, lumbar region: Secondary | ICD-10-CM | POA: Diagnosis not present

## 2022-11-11 ENCOUNTER — Ambulatory Visit (INDEPENDENT_AMBULATORY_CARE_PROVIDER_SITE_OTHER): Payer: Medicare Other | Admitting: Internal Medicine

## 2022-11-11 VITALS — BP 112/64 | HR 92 | Temp 97.7°F | Ht 72.0 in | Wt 164.0 lb

## 2022-11-11 DIAGNOSIS — M48061 Spinal stenosis, lumbar region without neurogenic claudication: Secondary | ICD-10-CM | POA: Diagnosis not present

## 2022-11-11 DIAGNOSIS — K4090 Unilateral inguinal hernia, without obstruction or gangrene, not specified as recurrent: Secondary | ICD-10-CM | POA: Diagnosis not present

## 2022-11-11 DIAGNOSIS — Z0001 Encounter for general adult medical examination with abnormal findings: Secondary | ICD-10-CM | POA: Diagnosis not present

## 2022-11-11 NOTE — Progress Notes (Signed)
Patient ID: Michael Lindsey, male   DOB: 28-Mar-1951, 72 y.o.   MRN: [id number]         Chief Complaint:: wellness exam and small right inguinal hernia, lumbar spinal stenosis,        HPI:  Michael Lindsey is a 72 y.o. male here for wellness exam, declines covid booster, o/w up to date                        Also has seen NS Dr Trenton Gammon with imgaing dx with lumbar spinal stenosis, now s/p ESI x1 that helped, had PT and remote trainer fo rhome excercises;  still working about 80% of full time, but hopefully to cut back further in another 9yr.  Has small right inguinal hernia but no pain.  Pt denies chest pain, increased sob or doe, wheezing, orthopnea, PND, increased LE swelling, palpitations, dizziness or syncope.   Pt denies polydipsia, polyuria, or new focal neuro s/s.    Pt denies fever, wt loss, night sweats, loss of appetite, or other constitutional symptoms     Wt Readings from Last 3 Encounters:  11/11/22 164 lb (74.4 kg)  10/13/22 165 lb (74.8 kg)  06/26/22 166 lb (75.3 kg)   BP Readings from Last 3 Encounters:  11/11/22 112/64  10/13/22 114/72  07/10/22 119/84   Immunization History  Administered Date(s) Administered   Fluad Quad(high Dose 65+) 06/12/2019   Influenza Split 07/10/2011   Influenza Whole 07/06/2002, 08/06/2012, 06/20/2013   Influenza, High Dose Seasonal PF 08/09/2017, 07/15/2018   Influenza-Unspecified 08/05/2015, 09/10/2020, 08/20/2021, 07/07/2022   PFIZER(Purple Top)SARS-COV-2 Vaccination 10/28/2019, 11/18/2019, 09/10/2020, 07/22/2021, 07/07/2022   Pneumococcal Conjugate-13 10/20/2016   Pneumococcal Polysaccharide-23 10/28/2017   Respiratory Syncytial Virus Vaccine,Recomb Aduvanted(Arexvy) 07/21/2022   Td 05/23/2002   Tdap 08/12/2013, 10/08/2014   Zoster Recombinat (Shingrix) 10/30/2018, 02/19/2019   Zoster, Live 07/06/2012   Health Maintenance Due  Topic Date Due   Medicare Annual Wellness (AWV)  10/30/2019   COVID-19 Vaccine (6 - 2023-24 season)  09/01/2022      Past Medical History:  Diagnosis Date   Erectile dysfunction 11/01/2019   Fracture    L arm @ 10; 3 ribs with shoulder separation & PTX 2003   Gastritis 09/13/2007   EGD   GERD (gastroesophageal reflux disease)    Hepatitis A 1972   from exposure to septic tank which malfunctioned   Internal hemorrhoids 09/13/2007   COLON   Osteoporosis    Paroxysmal atrial fibrillation (HCC)    chads2vasc score is 1   Past Surgical History:  Procedure Laterality Date   ATRIAL FIBRILLATION ABLATION N/A 06/14/2020   Procedure: ATRIAL FIBRILLATION ABLATION;  Surgeon: AThompson Grayer MD;  [facility];  Service: Cardiovascular;  Laterality: N/A;   cataract surgery  10/2014   COLONOSCOPY  2011   Dr PSharlett Iles  HERNIA REPAIR     SKIN CANCER EXCISION  2011   R calf, Dr DJarome Matin  UPPER GASTROINTESTINAL ENDOSCOPY  2003 & 2008    H pylori 2008    reports that he quit smoking about 42 years ago. His smoking use included cigarettes. He quit smokeless tobacco use about 42 years ago. He reports that he does not drink alcohol and does not use drugs. family history includes Alcohol abuse in his paternal uncle; Colon cancer (age of onset: 864 in his father; Coronary artery disease in his father; Depression in his brother and mother; Emphysema in his father;  Tuberculosis in his mother. Allergies  Allergen Reactions   Chlorine     EYES WATER/NASAL CONGESTION   Current Outpatient Medications on File Prior to Visit  Medication Sig Dispense Refill   acetaminophen (TYLENOL) 500 MG tablet Take 500 mg by mouth as needed for moderate pain or headache.      Ascorbic Acid (VITAMIN C) 1000 MG tablet Take 1,000 mg by mouth daily.     Carboxymethylcellulose Sodium (LUBRICANT EYE DROPS OP) Place 1 drop into both eyes 3 (three) times daily.     celecoxib (CELEBREX) 200 MG capsule Take 200 mg by mouth daily.     Cholecalciferol (VITAMIN D-3) 125 MCG (5000 UT) TABS Take 1 tablet by mouth  daily. 90 tablet 3   dextromethorphan-guaiFENesin (MUCINEX DM) 30-600 MG 12hr tablet Take 1 tablet by mouth as needed (congestion).      Flaxseed, Linseed, (FLAXSEED OIL) 1000 MG CAPS Take by mouth as directed.      sildenafil (VIAGRA) 100 MG tablet Take 1 tablet (100 mg total) by mouth daily as needed for erectile dysfunction. 10 tablet 3   vitamin E 180 MG (400 UNITS) capsule Take 400 Units by mouth daily.     metoprolol tartrate (LOPRESSOR) 25 MG tablet 1/2 tablet Oral Twice a day     No current facility-administered medications on file prior to visit.        ROS:  All others reviewed and negative.  Objective        PE:  BP 112/64 (BP Location: Left Arm, Patient Position: Sitting, Cuff Size: Large)   Pulse 92   Temp 97.7 F (36.5 C) (Oral)   Ht 6' (1.829 m)   Wt 164 lb (74.4 kg)   SpO2 98%   BMI 22.24 kg/m                 Constitutional: Pt appears in NAD               HENT: Head: NCAT.                Right Ear: External ear normal.                 Left Ear: External ear normal.                Eyes: . Pupils are equal, round, and reactive to light. Conjunctivae and EOM are normal               Nose: without d/c or deformity               Neck: Neck supple. Gross normal ROM               Cardiovascular: Normal rate and regular rhythm.                 Pulmonary/Chest: Effort normal and breath sounds without rales or wheezing.                Abd:  Soft, NT, ND, + BS, no organomegaly; small right inguinal hernia reducible, non tender               Neurological: Pt is alert. At baseline orientation, motor grossly intact               Skin: Skin is warm. No rashes, no other new lesions, LE edema - none               Psychiatric: Pt behavior is normal without  agitation   Micro: none  Cardiac tracings I have personally interpreted today:  none  Pertinent Radiological findings (summarize): none   Lab Results  Component Value Date   WBC 6.2 11/14/2022   HGB 15.5 11/14/2022    HCT 45.3 11/14/2022   PLT 210.0 11/14/2022   GLUCOSE 78 11/14/2022   CHOL 183 11/14/2022   TRIG 58.0 11/14/2022   HDL 66.90 11/14/2022   LDLCALC 105 (H) 11/14/2022   ALT 15 11/14/2022   AST 19 11/14/2022   NA 141 11/14/2022   K 4.5 11/14/2022   CL 104 11/14/2022   CREATININE 0.87 11/14/2022   BUN 10 11/14/2022   CO2 29 11/14/2022   TSH 2.24 11/14/2022   PSA 0.56 11/14/2022   HGBA1C 5.4 11/14/2022   Assessment/Plan:  Michael Lindsey is a 72 y.o. White or Caucasian [1] male with  has a past medical history of Erectile dysfunction (11/01/2019), Fracture, Gastritis (09/13/2007), GERD (gastroesophageal reflux disease), Hepatitis A (1972), Internal hemorrhoids (09/13/2007), Osteoporosis, and Paroxysmal atrial fibrillation ([place]).  Encounter for well adult exam with abnormal findings Age and sex appropriate education and counseling updated with regular exercise and diet Referrals for preventative services - none needed Immunizations addressed - declines covid booster  Smoking counseling  - none needed Evidence for depression or other mood disorder - none significant Most recent labs reviewed. I have personally reviewed and have noted: 1) the patient's medical and social history 2) The patient's current medications and supplements 3) The patient's height, weight, and BMI have been recorded in the chart   Lumbar spinal stenosis Chronic stable, to f/u ortho as planned  Followup: Return in about 1 year (around 11/12/2023).  Cathlean Cower, MD 11/15/2022 6:47 AM [facility]

## 2022-11-11 NOTE — Patient Instructions (Addendum)

## 2022-11-13 ENCOUNTER — Encounter (HOSPITAL_COMMUNITY): Payer: Self-pay | Admitting: *Deleted

## 2022-11-13 DIAGNOSIS — M4316 Spondylolisthesis, lumbar region: Secondary | ICD-10-CM | POA: Diagnosis not present

## 2022-11-13 DIAGNOSIS — M545 Low back pain, unspecified: Secondary | ICD-10-CM | POA: Diagnosis not present

## 2022-11-14 ENCOUNTER — Other Ambulatory Visit (INDEPENDENT_AMBULATORY_CARE_PROVIDER_SITE_OTHER): Payer: Medicare Other

## 2022-11-14 DIAGNOSIS — E559 Vitamin D deficiency, unspecified: Secondary | ICD-10-CM

## 2022-11-14 DIAGNOSIS — E7849 Other hyperlipidemia: Secondary | ICD-10-CM | POA: Diagnosis not present

## 2022-11-14 DIAGNOSIS — R739 Hyperglycemia, unspecified: Secondary | ICD-10-CM

## 2022-11-14 DIAGNOSIS — E538 Deficiency of other specified B group vitamins: Secondary | ICD-10-CM | POA: Diagnosis not present

## 2022-11-14 DIAGNOSIS — Z125 Encounter for screening for malignant neoplasm of prostate: Secondary | ICD-10-CM

## 2022-11-14 LAB — URINALYSIS, ROUTINE W REFLEX MICROSCOPIC
Bilirubin Urine: NEGATIVE
Hgb urine dipstick: NEGATIVE
Ketones, ur: NEGATIVE
Leukocytes,Ua: NEGATIVE
Nitrite: NEGATIVE
Specific Gravity, Urine: 1.01 (ref 1.000–1.030)
Total Protein, Urine: NEGATIVE
Urine Glucose: NEGATIVE
Urobilinogen, UA: 0.2 (ref 0.0–1.0)
pH: 8 (ref 5.0–8.0)

## 2022-11-14 LAB — CBC WITH DIFFERENTIAL/PLATELET
Basophils Absolute: 0 10*3/uL (ref 0.0–0.1)
Basophils Relative: 0.4 % (ref 0.0–3.0)
Eosinophils Absolute: 0.1 10*3/uL (ref 0.0–0.7)
Eosinophils Relative: 2.3 % (ref 0.0–5.0)
HCT: 45.3 % (ref 39.0–52.0)
Hemoglobin: 15.5 g/dL (ref 13.0–17.0)
Lymphocytes Relative: 13.9 % (ref 12.0–46.0)
Lymphs Abs: 0.9 10*3/uL (ref 0.7–4.0)
MCHC: 34.3 g/dL (ref 30.0–36.0)
MCV: 97.4 fl (ref 78.0–100.0)
Monocytes Absolute: 0.6 10*3/uL (ref 0.1–1.0)
Monocytes Relative: 9.8 % (ref 3.0–12.0)
Neutro Abs: 4.5 10*3/uL (ref 1.4–7.7)
Neutrophils Relative %: 73.6 % (ref 43.0–77.0)
Platelets: 210 10*3/uL (ref 150.0–400.0)
RBC: 4.65 Mil/uL (ref 4.22–5.81)
RDW: 13.9 % (ref 11.5–15.5)
WBC: 6.2 10*3/uL (ref 4.0–10.5)

## 2022-11-14 LAB — BASIC METABOLIC PANEL
BUN: 10 mg/dL (ref 6–23)
CO2: 29 mEq/L (ref 19–32)
Calcium: 9 mg/dL (ref 8.4–10.5)
Chloride: 104 mEq/L (ref 96–112)
Creatinine, Ser: 0.87 mg/dL (ref 0.40–1.50)
GFR: 86.05 mL/min (ref >60.00)
Glucose, Bld: 78 mg/dL (ref 70–99)
Potassium: 4.5 mEq/L (ref 3.5–5.1)
Sodium: 141 mEq/L (ref 135–145)

## 2022-11-14 LAB — TSH: TSH: 2.24 u[IU]/mL (ref 0.35–5.50)

## 2022-11-14 LAB — HEPATIC FUNCTION PANEL
ALT: 15 U/L (ref 0–53)
AST: 19 U/L (ref 0–37)
Albumin: 4 g/dL (ref 3.5–5.2)
Alkaline Phosphatase: 59 U/L (ref 39–117)
Bilirubin, Direct: 0.2 mg/dL (ref 0.0–0.3)
Total Bilirubin: 0.8 mg/dL (ref 0.2–1.2)
Total Protein: 6.3 g/dL (ref 6.0–8.3)

## 2022-11-14 LAB — PSA: PSA: 0.56 ng/mL (ref 0.10–4.00)

## 2022-11-14 LAB — LIPID PANEL
Cholesterol: 183 mg/dL (ref 0–200)
HDL: 66.9 mg/dL (ref >39.00)
LDL Cholesterol: 105 mg/dL — ABNORMAL HIGH (ref 0–99)
NonHDL: 116.1
Total CHOL/HDL Ratio: 3
Triglycerides: 58 mg/dL (ref 0.0–149.0)
VLDL: 11.6 mg/dL (ref 0.0–40.0)

## 2022-11-14 LAB — VITAMIN D 25 HYDROXY (VIT D DEFICIENCY, FRACTURES): VITD: 60.18 ng/mL (ref 30.00–100.00)

## 2022-11-14 LAB — HEMOGLOBIN A1C: Hgb A1c MFr Bld: 5.4 % (ref 4.6–6.5)

## 2022-11-14 LAB — VITAMIN B12: Vitamin B-12: 381 pg/mL (ref 211–911)

## 2022-11-15 ENCOUNTER — Encounter: Payer: Self-pay | Admitting: Internal Medicine

## 2022-11-15 DIAGNOSIS — K4090 Unilateral inguinal hernia, without obstruction or gangrene, not specified as recurrent: Secondary | ICD-10-CM | POA: Insufficient documentation

## 2022-11-15 NOTE — Assessment & Plan Note (Signed)
Chronic stable, to f/u ortho as planned

## 2022-11-15 NOTE — Assessment & Plan Note (Signed)

## 2022-11-17 ENCOUNTER — Telehealth: Payer: Self-pay

## 2022-11-17 DIAGNOSIS — M545 Low back pain, unspecified: Secondary | ICD-10-CM | POA: Diagnosis not present

## 2022-11-17 DIAGNOSIS — M4316 Spondylolisthesis, lumbar region: Secondary | ICD-10-CM | POA: Diagnosis not present

## 2022-11-17 NOTE — Telephone Encounter (Signed)
Left message for patient to call back to schedule Medicare Annual Wellness Visit   Last AWV  10/29/18  Please schedule at anytime with [facility] if patient calls the office back.    30 Minutes appointment   Any questions, please call me at [phone]

## 2022-11-20 DIAGNOSIS — M4316 Spondylolisthesis, lumbar region: Secondary | ICD-10-CM | POA: Diagnosis not present

## 2022-11-20 DIAGNOSIS — M545 Low back pain, unspecified: Secondary | ICD-10-CM | POA: Diagnosis not present

## 2022-11-21 ENCOUNTER — Ambulatory Visit: Payer: Medicare Other | Admitting: Internal Medicine

## 2022-11-24 DIAGNOSIS — K080 Exfoliation of teeth due to systemic causes: Secondary | ICD-10-CM | POA: Diagnosis not present

## 2022-11-26 DIAGNOSIS — M4316 Spondylolisthesis, lumbar region: Secondary | ICD-10-CM | POA: Diagnosis not present

## 2022-11-27 DIAGNOSIS — M9903 Segmental and somatic dysfunction of lumbar region: Secondary | ICD-10-CM | POA: Diagnosis not present

## 2022-11-27 DIAGNOSIS — S338XXA Sprain of other parts of lumbar spine and pelvis, initial encounter: Secondary | ICD-10-CM | POA: Diagnosis not present

## 2022-11-27 DIAGNOSIS — M9902 Segmental and somatic dysfunction of thoracic region: Secondary | ICD-10-CM | POA: Diagnosis not present

## 2022-11-27 DIAGNOSIS — M9905 Segmental and somatic dysfunction of pelvic region: Secondary | ICD-10-CM | POA: Diagnosis not present

## 2022-11-28 DIAGNOSIS — M545 Low back pain, unspecified: Secondary | ICD-10-CM | POA: Diagnosis not present

## 2022-11-28 DIAGNOSIS — M4316 Spondylolisthesis, lumbar region: Secondary | ICD-10-CM | POA: Diagnosis not present

## 2022-12-12 DIAGNOSIS — M545 Low back pain, unspecified: Secondary | ICD-10-CM | POA: Diagnosis not present

## 2022-12-12 DIAGNOSIS — M4316 Spondylolisthesis, lumbar region: Secondary | ICD-10-CM | POA: Diagnosis not present

## 2022-12-13 ENCOUNTER — Encounter: Payer: Self-pay | Admitting: Internal Medicine

## 2022-12-19 DIAGNOSIS — M4316 Spondylolisthesis, lumbar region: Secondary | ICD-10-CM | POA: Diagnosis not present

## 2022-12-19 DIAGNOSIS — M545 Low back pain, unspecified: Secondary | ICD-10-CM | POA: Diagnosis not present

## 2022-12-24 DIAGNOSIS — M545 Low back pain, unspecified: Secondary | ICD-10-CM | POA: Diagnosis not present

## 2022-12-24 DIAGNOSIS — M4316 Spondylolisthesis, lumbar region: Secondary | ICD-10-CM | POA: Diagnosis not present

## 2023-01-07 DIAGNOSIS — M4316 Spondylolisthesis, lumbar region: Secondary | ICD-10-CM | POA: Diagnosis not present

## 2023-01-07 DIAGNOSIS — M545 Low back pain, unspecified: Secondary | ICD-10-CM | POA: Diagnosis not present

## 2023-01-20 DIAGNOSIS — M545 Low back pain, unspecified: Secondary | ICD-10-CM | POA: Diagnosis not present

## 2023-01-20 DIAGNOSIS — M4316 Spondylolisthesis, lumbar region: Secondary | ICD-10-CM | POA: Diagnosis not present

## 2023-01-27 DIAGNOSIS — L821 Other seborrheic keratosis: Secondary | ICD-10-CM | POA: Diagnosis not present

## 2023-01-27 DIAGNOSIS — Z85828 Personal history of other malignant neoplasm of skin: Secondary | ICD-10-CM | POA: Diagnosis not present

## 2023-01-28 DIAGNOSIS — M545 Low back pain, unspecified: Secondary | ICD-10-CM | POA: Diagnosis not present

## 2023-01-28 DIAGNOSIS — M4316 Spondylolisthesis, lumbar region: Secondary | ICD-10-CM | POA: Diagnosis not present

## 2023-02-02 ENCOUNTER — Ambulatory Visit (HOSPITAL_COMMUNITY)
Admission: RE | Admit: 2023-02-02 | Discharge: 2023-02-02 | Disposition: A | Discharge status: Home / Self Care | Payer: Medicare Other | Source: Ambulatory Visit | Attending: Physician Assistant | Admitting: Physician Assistant

## 2023-02-02 ENCOUNTER — Encounter (HOSPITAL_COMMUNITY): Payer: Self-pay | Admitting: Physician Assistant

## 2023-02-02 ENCOUNTER — Inpatient Hospital Stay (HOSPITAL_COMMUNITY)
Admission: RE | Admit: 2023-02-02 | Discharge: 2023-02-02 | Disposition: A | Discharge status: Home / Self Care | Payer: Medicare Other | Source: Ambulatory Visit | Attending: Physician Assistant | Admitting: Physician Assistant

## 2023-02-02 VITALS — BP 116/78 | HR 82 | Ht 72.0 in | Wt 167.2 lb

## 2023-02-02 DIAGNOSIS — Z8249 Family history of ischemic heart disease and other diseases of the circulatory system: Secondary | ICD-10-CM | POA: Diagnosis not present

## 2023-02-02 DIAGNOSIS — Z79899 Other long term (current) drug therapy: Secondary | ICD-10-CM | POA: Insufficient documentation

## 2023-02-02 DIAGNOSIS — Z87891 Personal history of nicotine dependence: Secondary | ICD-10-CM | POA: Insufficient documentation

## 2023-02-02 DIAGNOSIS — I493 Ventricular premature depolarization: Secondary | ICD-10-CM

## 2023-02-02 DIAGNOSIS — I480 Paroxysmal atrial fibrillation: Secondary | ICD-10-CM | POA: Diagnosis not present

## 2023-02-02 DIAGNOSIS — I2510 Atherosclerotic heart disease of native coronary artery without angina pectoris: Secondary | ICD-10-CM | POA: Diagnosis not present

## 2023-02-02 DIAGNOSIS — D6869 Other thrombophilia: Secondary | ICD-10-CM | POA: Insufficient documentation

## 2023-02-02 NOTE — Progress Notes (Signed)
Primary Care Physician: Corwin Levins, MD Primary Cardiologist: Dr Eldridge Dace (remotely) Primary Electrophysiologist: Dr Elberta Fortis (prior [facility] patient) Referring Physician: Dr Rosaland Lao Michael Lindsey is a 72 y.o. male with a history of CAD and atrial fibrillation who presents for follow up in the [facility].  The patient was initially diagnosed with atrial fibrillation in 2017 and was maintained on flecainide. He underwent afib ablation with Dr Johney Frame 05/07/20 and flecainide was discontinued. Patient has a CHADS2VASC score of 2.  On follow up today, patient reports that over the past 2 weeks he has noticed an increase in afib burden on his Apple Watch, as high as 14%. He is asymptomatic. He does state that he has been out of town often and has had more alcohol than usual. At home, he only drinks non-alcoholic beer.   Today, he denies symptoms of palpitations, chest pain, shortness of breath, orthopnea, PND, lower extremity edema, dizziness, presyncope, syncope, snoring, daytime somnolence, bleeding, or neurologic sequela. The patient is tolerating medications without difficulties and is otherwise without complaint today.    Atrial Fibrillation Risk Factors:  he does not have symptoms or diagnosis of sleep apnea. he does not have a history of rheumatic fever.   he has a BMI of Body mass index is 22.68 kg/m.Marland Kitchen Filed Weights   02/02/23 0843  Weight: 75.8 kg    Family History  Problem Relation Age of Onset   Depression Mother    Tuberculosis Mother    Coronary artery disease Father    Emphysema Father    Colon cancer Father 71   Depression Brother    Alcohol abuse Paternal Uncle    Diabetes Neg Hx    Stroke Neg Hx      Atrial Fibrillation Management history:  Previous antiarrhythmic drugs: flecainide  Previous cardioversions: none Previous ablations: 06/14/20 Anticoagulation history: Eliquis   Past Medical History:  Diagnosis Date   Erectile  dysfunction 11/01/2019   Fracture    L arm @ 10; 3 ribs with shoulder separation & PTX 2003   Gastritis 09/13/2007   EGD   GERD (gastroesophageal reflux disease)    Hepatitis A 1972   from exposure to septic tank which malfunctioned   Internal hemorrhoids 09/13/2007   COLON   Osteoporosis    Paroxysmal atrial fibrillation (HCC)    chads2vasc score is 1   Past Surgical History:  Procedure Laterality Date   ATRIAL FIBRILLATION ABLATION N/A 06/14/2020   Procedure: ATRIAL FIBRILLATION ABLATION;  Surgeon: Hillis Range, MD;  Location: [facility];  Service: Cardiovascular;  Laterality: N/A;   cataract surgery  10/2014   COLONOSCOPY  2011   Dr Jarold Motto   HERNIA REPAIR     SKIN CANCER EXCISION  2011   R calf, Dr Donzetta Starch   UPPER GASTROINTESTINAL ENDOSCOPY  2003 & 2008    H pylori 2008    Current Outpatient Medications  Medication Sig Dispense Refill   acetaminophen (TYLENOL) 500 MG tablet Take 500 mg by mouth as needed for moderate pain or headache.      Ascorbic Acid (VITAMIN C) 1000 MG tablet Take 1,000 mg by mouth daily.     Carboxymethylcellulose Sodium (LUBRICANT EYE DROPS OP) Place 1 drop into both eyes 3 (three) times daily.     celecoxib (CELEBREX) 200 MG capsule Take 200 mg by mouth daily.     Cholecalciferol (VITAMIN D-3) 125 MCG (5000 UT) TABS Take 1 tablet by mouth daily. 90  tablet 3   dextromethorphan-guaiFENesin (MUCINEX DM) 30-600 MG 12hr tablet Take 1 tablet by mouth as needed (congestion).      Flaxseed, Linseed, (FLAXSEED OIL) 1000 MG CAPS Take by mouth as directed.      sildenafil (VIAGRA) 100 MG tablet Take 1 tablet (100 mg total) by mouth daily as needed for erectile dysfunction. 10 tablet 3   vitamin E 180 MG (400 UNITS) capsule Take 400 Units by mouth daily.     No current facility-administered medications for this encounter.    Allergies  Allergen Reactions   Chlorine     EYES WATER/NASAL CONGESTION    Social History   Socioeconomic History    Marital status: Married    Spouse name: Not on file   Number of children: 2   Years of education: 18   Highest education level: Not on file  Occupational History   Occupation: IMMIGRATION ATTORNEY  Tobacco Use   Smoking status: Former    Types: Cigarettes    Quit date: 10/06/1980    Years since quitting: 42.3   Smokeless tobacco: Former    Quit date: 1982   Tobacco comments:    intermittent smoker over 4 years in high school & again in 1983 before quitting, never > 2 mos @ a time,never > 1/2 ppd  Vaping Use   Vaping Use: Never used  Substance and Sexual Activity   Alcohol use: Yes    Alcohol/week: 3.0 standard drinks of alcohol    Types: 3 Standard drinks or equivalent per week    Comment: couple of mix drinks or non alcoholic beer 02/02/23   Drug use: No   Sexual activity: Not Currently  Other Topics Concern   Not on file  Social History Narrative   GETS REG EXERCISE      Lives in [place]   Works as an Air cabin crew      Social Determinants of Health   Financial Resource Strain: Low Risk  (10/29/2018)   Overall Financial Resource Strain (CARDIA)    Difficulty of Paying Living Expenses: Not hard at all  Food Insecurity: No Food Insecurity (10/29/2018)   Hunger Vital Sign    Worried About Running Out of Food in the Last Year: Never true    Ran Out of Food in the Last Year: Never true  Transportation Needs: No Transportation Needs (10/29/2018)   PRAPARE - Administrator, Civil Service (Medical): No    Lack of Transportation (Non-Medical): No  Physical Activity: Sufficiently Active (10/29/2018)   Exercise Vital Sign    Days of Exercise per Week: 6 days    Minutes of Exercise per Session: 60 min  Stress: Stress Concern Present (10/29/2018)   [employer] of Occupational Health - Occupational Stress Questionnaire    Feeling of Stress : To some extent  Social Connections: Socially Integrated (10/29/2018)   Social Connection and Isolation Panel [NHANES]     Frequency of Communication with Friends and Family: More than three times a week    Frequency of Social Gatherings with Friends and Family: More than three times a week    Attends Religious Services: 1 to 4 times per year    Active Member of [employer] or Organizations: Yes    Attends Banker Meetings: More than 4 times per year    Marital Status: Married  Catering manager Violence: Not At Risk (10/29/2018)   Humiliation, Afraid, Rape, and Kick questionnaire    Fear of Current or Ex-Partner: No  Emotionally Abused: No    Physically Abused: No    Sexually Abused: No     ROS- All systems are reviewed and negative except as per the HPI above.  Physical Exam: Vitals:   02/02/23 0843  BP: 116/78  Pulse: 82  Weight: 75.8 kg  Height: 6' (1.829 m)    GEN- The patient is a well appearing male, alert and oriented x 3 today.   Head- normocephalic, atraumatic Eyes-  Sclera clear, conjunctiva pink Ears- hearing intact Oropharynx- clear Neck- supple  Lungs- Clear to ausculation bilaterally, normal work of breathing Heart- Regular rate and rhythm, frequent ectopic beats, no murmurs, rubs or gallops  GI- soft, NT, ND, + BS Extremities- no clubbing, cyanosis, or edema MS- no significant deformity or atrophy Skin- no rash or lesion Psych- euthymic mood, full affect Neuro- strength and sensation are intact  Wt Readings from Last 3 Encounters:  02/02/23 75.8 kg  11/11/22 74.4 kg  10/13/22 74.8 kg    EKG today demonstrates  SR, PVCs Vent. rate 82 BPM PR interval 174 ms QRS duration 94 ms QT/QTcB 372/434 ms  Echo 05/07/20 demonstrated  1. Left ventricular ejection fraction, by estimation, is 55%. The left  ventricle has normal function. The left ventricle has no regional wall  motion abnormalities. Left ventricular diastolic parameters were normal.   2. Right ventricular systolic function is normal. The right ventricular  size is normal. There is normal pulmonary  artery systolic pressure. The  estimated right ventricular systolic pressure is 19.2 mmHg.   3. Left atrial size was mildly dilated.   4. Right atrial size was mildly dilated.   5. The mitral valve is normal in structure. Trivial mitral valve  regurgitation. No evidence of mitral stenosis.   6. The aortic valve is tricuspid. Aortic valve regurgitation is not  visualized. No aortic stenosis is present.   7. The inferior vena cava is normal in size with greater than 50%  respiratory variability, suggesting right atrial pressure of 3 mmHg.   Epic records are reviewed at length today.  CHA2DS2-VASc Score = 2  The patient's score is based upon: CHF History: 0 HTN History: 0 Diabetes History: 0 Stroke History: 0 Vascular Disease History: 1 Age Score: 1 Gender Score: 0       ASSESSMENT AND PLAN: 1. Paroxysmal Atrial Fibrillation (ICD10:  I48.0) The patient's CHA2DS2-VASc score is 2, indicating a 2.2% annual risk of stroke.   S/p afib ablation 06/14/20 Apple Watch showing an increased burden, 5% > 10% > 14%. Suspected related to increased alcohol intake on vacation.  We discussed rhythm control options today including AAD and repeat ablation. He would like to pursue watchful waiting for now and avoid alcohol.  Continue Lopressor 12.5 mg BID Apple Watch for home monitoring.   2. Secondary Hypercoagulable State (ICD10:  D68.69) The patient is at significant risk for stroke/thromboembolism based upon his CHA2DS2-VASc Score of 2.  Patient is not on anticoagulation, discontinued post ablation.     3. CAD CAC score 305 on CT No anginal symptoms.  4. PVCs Frequent PVCs noted on ECG today and his Apple Watch. Will have him wear a 3 day Zio to evaluate burden.   Follow up in the AF clinic in 2 months.    Jorja Loa PA-C Af[facility] [address] [place], [place] [address] [phone] 02/02/2023 9:07 AM

## 2023-02-04 DIAGNOSIS — M545 Low back pain, unspecified: Secondary | ICD-10-CM | POA: Diagnosis not present

## 2023-02-04 DIAGNOSIS — M4316 Spondylolisthesis, lumbar region: Secondary | ICD-10-CM | POA: Diagnosis not present

## 2023-02-10 DIAGNOSIS — I493 Ventricular premature depolarization: Secondary | ICD-10-CM | POA: Diagnosis not present

## 2023-02-11 DIAGNOSIS — M4316 Spondylolisthesis, lumbar region: Secondary | ICD-10-CM | POA: Diagnosis not present

## 2023-02-11 DIAGNOSIS — M545 Low back pain, unspecified: Secondary | ICD-10-CM | POA: Diagnosis not present

## 2023-02-18 DIAGNOSIS — M4316 Spondylolisthesis, lumbar region: Secondary | ICD-10-CM | POA: Diagnosis not present

## 2023-02-18 DIAGNOSIS — M545 Low back pain, unspecified: Secondary | ICD-10-CM | POA: Diagnosis not present

## 2023-02-20 ENCOUNTER — Other Ambulatory Visit (HOSPITAL_COMMUNITY): Payer: Self-pay | Admitting: *Deleted

## 2023-02-20 DIAGNOSIS — I493 Ventricular premature depolarization: Secondary | ICD-10-CM

## 2023-02-20 MED ORDER — METOPROLOL TARTRATE 25 MG PO TABS: 12.5000 mg | ORAL_TABLET | Freq: Two times a day (BID) | ORAL | 3 refills | Status: DC

## 2023-02-25 DIAGNOSIS — M4316 Spondylolisthesis, lumbar region: Secondary | ICD-10-CM | POA: Diagnosis not present

## 2023-02-25 DIAGNOSIS — M545 Low back pain, unspecified: Secondary | ICD-10-CM | POA: Diagnosis not present

## 2023-03-11 DIAGNOSIS — M65341 Trigger finger, right ring finger: Secondary | ICD-10-CM | POA: Diagnosis not present

## 2023-03-11 DIAGNOSIS — M180 Bilateral primary osteoarthritis of first carpometacarpal joints: Secondary | ICD-10-CM | POA: Diagnosis not present

## 2023-03-11 DIAGNOSIS — H00015 Hordeolum externum left lower eyelid: Secondary | ICD-10-CM | POA: Diagnosis not present

## 2023-03-18 ENCOUNTER — Telehealth: Payer: Self-pay | Admitting: Cardiology

## 2023-03-18 NOTE — Telephone Encounter (Signed)
Discussed pt concerns/findings. He woke up about 2:30 am to go to the bathroom, ended up staying awake for the next hour or so, and at 3:30 am he got a notification from his watch that he had 10 minutes of low HRs. Pt only felt his heart beating harder, but nothing else.  Went to sleep afterwards. Advised pt to monitor for now, if worsens and pt becomes symptomatic to call us back. Advised to keep appt on 26th for further evaluation and possible monitor. Advised to call office after hours/over weekend if needed. Patient verbalized understanding and agreeable to plan.

## 2023-03-18 NOTE — Telephone Encounter (Signed)
  STAT if HR is under 50 or over 120 (normal HR is 60-100 beats per minute)  What is your heart rate? Mids 70   Do you have a log of your heart rate readings (document readings)?   Do you have any other symptoms? Pt said, last night his apple watch notify him his heart rate is below 40s for 10 minutes. He said he doesn't have any symptoms and his HR is in mid 70s right now. He is just checking in with Dr. Elberta Fortis if he needs to be seen sooner. He said, he has a upcoming trip this weekend, if he needs to cancel it per Dr. Elberta Fortis he will do that or if he needs to see afib clinic for sooner appt he will do that as well.

## 2023-03-26 ENCOUNTER — Ambulatory Visit (HOSPITAL_COMMUNITY)
Admission: RE | Admit: 2023-03-26 | Discharge: 2023-03-26 | Disposition: A | Discharge status: Home / Self Care | Payer: Medicare Other | Source: Ambulatory Visit | Attending: Physician Assistant | Admitting: Physician Assistant

## 2023-03-26 DIAGNOSIS — I493 Ventricular premature depolarization: Secondary | ICD-10-CM

## 2023-03-26 DIAGNOSIS — I4891 Unspecified atrial fibrillation: Secondary | ICD-10-CM | POA: Diagnosis not present

## 2023-03-26 DIAGNOSIS — Z87891 Personal history of nicotine dependence: Secondary | ICD-10-CM | POA: Insufficient documentation

## 2023-03-26 DIAGNOSIS — I480 Paroxysmal atrial fibrillation: Secondary | ICD-10-CM | POA: Insufficient documentation

## 2023-03-26 LAB — ECHOCARDIOGRAM COMPLETE
AR max vel: 2.63 cm2
AV Area VTI: 2.56 cm2
AV Area mean vel: 2.68 cm2
AV Mean grad: 2.5 mmHg
AV Peak grad: 3.6 mmHg
Ao pk vel: 0.95 m/s
Area-P 1/2: 2.18 cm2
Calc EF: 61.8 %
S' Lateral: 3.1 cm
Single Plane A2C EF: 62.8 %
Single Plane A4C EF: 58.8 %

## 2023-03-27 ENCOUNTER — Ambulatory Visit (HOSPITAL_COMMUNITY): Payer: Medicare Other | Admitting: Physician Assistant

## 2023-03-30 ENCOUNTER — Encounter: Payer: Self-pay | Admitting: Cardiology

## 2023-03-30 ENCOUNTER — Ambulatory Visit: Payer: Medicare Other | Attending: Cardiology | Admitting: Cardiology

## 2023-03-30 VITALS — BP 120/80 | HR 63 | Ht 72.0 in | Wt 169.2 lb

## 2023-03-30 DIAGNOSIS — I493 Ventricular premature depolarization: Secondary | ICD-10-CM

## 2023-03-30 DIAGNOSIS — I480 Paroxysmal atrial fibrillation: Secondary | ICD-10-CM | POA: Diagnosis not present

## 2023-03-30 NOTE — Progress Notes (Signed)
  Electrophysiology Office Note:   Date:  03/30/2023  ID:  BENFORD ASCH, DOB 08-15-1951, MRN [id number]  Primary Cardiologist: None Electrophysiologist: Debraann Livingstone Jorja Loa, MD      History of Present Illness:   Michael Lindsey is a 72 y.o. male with h/o atrial fibrillation, PVCs seen today for routine electrophysiology followup.  Since last being seen in our clinic the patient reports doing well.  He presents because his Apple Watch showed atrial fibrillation.  He did not take an EKG during that time.  He was started on metoprolol, which has apparently reduced his burden..  he denies chest pain, palpitations, dyspnea, PND, orthopnea, nausea, vomiting, dizziness, syncope, edema, weight gain, or early satiety.     He has a history of coronary disease and atrial fibrillation.  Atrial fibrillation was diagnosed in 2017.  He was initially on flecainide; he underwent ablation 05/07/2020 and flecainide was discontinued.  His Apple Watch revealed that he was having more episodes of atrial fibrillation.  He wore a cardiac monitor which showed no atrial fibrillation but a 15% PVC burden.  He has since had an echo with normal ejection fraction.     Review of systems complete and found to be negative unless listed in HPI.   Studies Reviewed:    EKG is ordered today. Personal review as below.      Risk Assessment/Calculations:    CHA2DS2-VASc Score = 2   This indicates a 2.2% annual risk of stroke. The patient's score is based upon: CHF History: 0 HTN History: 0 Diabetes History: 0 Stroke History: 0 Vascular Disease History: 1 Age Score: 1 Gender Score: 0             Physical Exam:   VS:  BP 120/80   Pulse 63   Ht 6' (1.829 m)   Wt 169 lb 3.2 oz (76.7 kg)   SpO2 99%   BMI 22.95 kg/m    Wt Readings from Last 3 Encounters:  03/30/23 169 lb 3.2 oz (76.7 kg)  02/02/23 167 lb 3.2 oz (75.8 kg)  11/11/22 164 lb (74.4 kg)     GEN: Well nourished, well developed in no acute  distress NECK: No JVD; No carotid bruits CARDIAC: Regular rate and rhythm, no murmurs, rubs, gallops RESPIRATORY:  Clear to auscultation without rales, wheezing or rhonchi  ABDOMEN: Soft, non-tender, non-distended EXTREMITIES:  No edema; No deformity   ASSESSMENT AND PLAN:    1.  Paroxysmal atrial fibrillation: 14% burden on Apple Watch.  Currently on metoprolol.  I am not convinced that he is actually having atrial fibrillation as he is also having PVCs.  He is currently not anticoagulated as this was stopped after his most recent ablation.  He Gabor Lusk get EKGs on his Apple Watch intermittently to see if atrial fibrillation is occurring.  2.  Secondary hypercoagulable state: Anticoagulation discontinued post ablation.  3.  Coronary artery disease: Calcium score of 305 on CT.  No angina.  4.  PVCs: 15% burden on cardiac monitor.  He is minimally symptomatic and is able to do all of his daily activities.  Additionally, his ejection fraction has remained normal.  He is on metoprolol which she feels has improved his symptoms.  Zoua Caporaso continue to monitor.  Follow up with Dr. Elberta Fortis in 6 months  Signed, Ryiah Bellissimo Jorja Loa, MD

## 2023-03-30 NOTE — Patient Instructions (Signed)
Medication Instructions:  °Your physician recommends that you continue on your current medications as directed. Please refer to the Current Medication list given to you today. ° °*If you need a refill on your cardiac medications before your next appointment, please call your pharmacy* ° ° °Lab Work: °None ordered ° ° °Testing/Procedures: °None ordered ° ° °Follow-Up: °At [facility], you and your health needs are our priority.  As part of our continuing mission to provide you with exceptional heart care, we have created designated Provider Care Teams.  These Care Teams include your primary Cardiologist (physician) and Advanced Practice Providers (APPs -  Physician Assistants and Nurse Practitioners) who all work together to provide you with the care you need, when you need it. ° °Your next appointment:   °6 month(s) ° °The format for your next appointment:   °In Person ° °Provider:   °Will Camnitz, MD ° ° ° °Thank you for choosing [facility]!! ° ° °Dorothie Wah, RN °[phone] °  °

## 2023-04-07 ENCOUNTER — Other Ambulatory Visit: Payer: Self-pay

## 2023-04-07 MED ORDER — SILDENAFIL CITRATE 100 MG PO TABS: 100.0000 mg | ORAL_TABLET | Freq: Every day | ORAL | 3 refills | Status: DC | PRN

## 2023-04-07 NOTE — Telephone Encounter (Signed)
Pt calling requesting a refill on sildenafil. Pt would like for Dr. Elberta Fortis to refill this medication for him. Pt would like a call back concerning this matter. Please address

## 2023-04-08 MED ORDER — SILDENAFIL CITRATE 100 MG PO TABS: 100.0000 mg | ORAL_TABLET | Freq: Every day | ORAL | 3 refills | Status: DC | PRN

## 2023-04-08 NOTE — Telephone Encounter (Signed)
Reviewed w/ Dr. Eldridge Dace who is agreeable to refilling. Rx sent

## 2023-04-08 NOTE — Addendum Note (Signed)
Addended by: Baird Lyons on: 04/08/2023 01:19 PM   Modules accepted: Orders

## 2023-04-08 NOTE — Telephone Encounter (Addendum)
Spoke with patient to clarify whether he is taking nitroglycerin SL. Pt confirmed he is not taking nitroglycerin and has never had it prescribed to him.   Advised that Dr. Eldridge Dace agreed to refill sildenafil 100mg  (1 tablet) daily PRN. Pt previously had 20mg  tablets on hand and had to take at least 4 tablets. Pt verbalized understanding and appreciation of updated prescription information.

## 2023-04-08 NOTE — Addendum Note (Signed)
Addended by: Baird Lyons on: 04/08/2023 01:18 PM   Modules accepted: Orders

## 2023-04-08 NOTE — Telephone Encounter (Signed)
Called and spoke to [facility] to cancel Sildenafil Rx refill sent in last night by our office. Will send a note to Dr. Eldridge Dace to see if he is willing to refill for pt.

## 2023-04-23 ENCOUNTER — Telehealth: Payer: Self-pay | Admitting: Radiology

## 2023-04-23 NOTE — Telephone Encounter (Signed)
Left voice mail for patient to call back at [phone] to schedule Medicare Annual Wellness Visit    Last AWV:  10/29/2018   Please schedule Sequential/Initial AWV with [facility] K. CMA

## 2023-05-01 ENCOUNTER — Encounter: Payer: Self-pay | Admitting: Cardiology

## 2023-05-11 ENCOUNTER — Telehealth: Payer: Self-pay | Admitting: *Deleted

## 2023-05-11 NOTE — Telephone Encounter (Signed)
Dr. Elberta Fortis,  You saw this patient on 03/30/2023. Will you please comment on medical clearance for colonoscopy?  Please route your response to P CV DIV Preop. I will communicate with requesting office once you have given recommendations.   Thank you!  Carlos Levering, NP

## 2023-05-11 NOTE — Telephone Encounter (Signed)
   Pre-operative Risk Assessment    Patient Name: Michael Lindsey  DOB: [birth year] MRN: [id number]      Request for Surgical Clearance    Procedure:   COLONOSCOPY   Date of Surgery:  Clearance TBD                                 Surgeon:  DR. Matthias Hughs Surgeon's Group or Practice Name:  [facility] Phone number:  [phone] Fax number:  [phone]   Type of Clearance Requested:   - Medical    Type of Anesthesia:  Not Indicated   Additional requests/questions:    [name]   05/11/2023, 9:20 AM ]

## 2023-05-13 NOTE — Telephone Encounter (Signed)
   Patient Name: Michael Lindsey  DOB: Dec 28, 1950 MRN: [id number]  Primary Cardiologist: None  Chart reviewed as part of pre-operative protocol coverage. Given past medical history and time since last visit, based on ACC/AHA guidelines, JATINDER BUFFKIN is at acceptable risk for the planned procedure without further cardiovascular testing.   The patient was advised that if he develops new symptoms prior to surgery to contact our office to arrange for a follow-up visit, and he verbalized understanding.  Patient is not on anticoagulation or antiplatelet medication.   Per Dr. Elberta Fortis replied to physician recommendation request for cardiac clearance, on 05/13/2023, patient is stable from a cardiac standpoint and will not need any further cardiac testing.  I will route this recommendation to the requesting party via Epic fax function and remove from pre-op pool.  Please call with questions.  Joni Reining, NP 05/13/2023, 4:28 PM

## 2023-05-21 DIAGNOSIS — K080 Exfoliation of teeth due to systemic causes: Secondary | ICD-10-CM | POA: Diagnosis not present

## 2023-05-22 ENCOUNTER — Other Ambulatory Visit: Payer: Self-pay

## 2023-05-22 MED ORDER — METOPROLOL TARTRATE 25 MG PO TABS: 12.5000 mg | ORAL_TABLET | Freq: Two times a day (BID) | ORAL | 3 refills | Status: DC

## 2023-05-22 NOTE — Telephone Encounter (Signed)
Pt calling requesting a refill on metoprolol. Pt stated that Dr. Elberta Fortis stated that he would change medication to pt taking daily at night. Pt would like for this to be changed. Would Dr.  Elberta Fortis like to change pt's direction to taking 1 tablet daily at night? Pt would like a call back concerning this matter. Please address

## 2023-05-25 DIAGNOSIS — M65341 Trigger finger, right ring finger: Secondary | ICD-10-CM | POA: Diagnosis not present

## 2023-07-02 ENCOUNTER — Telehealth: Payer: Self-pay | Admitting: *Deleted

## 2023-07-02 NOTE — Telephone Encounter (Signed)
Called patient driving at this time will call back to schedule tele visit

## 2023-07-02 NOTE — Telephone Encounter (Signed)
Pre-operative Risk Assessment    Patient Name: Michael Lindsey  DOB: 08/05/1951 MRN: [id number]   DATE OF LAST VISIT:  03/30/23 DR. CAMNITZ DATE OF NEXT VISIT: 09/21/23 DR. CAMNITZ  Request for Surgical Clearance    Procedure:   COLONOSCOPY  Date of Surgery:  Clearance TBD                                 Surgeon:  DR. Bernette Redbird Surgeon's Group or Practice Name:  [facility] Phone number:  [phone] Fax number:  [phone]   Type of Clearance Requested:   - Medical ; NO MEDICATIONS LISTED AS NEEDING TO BE HELD   Type of Anesthesia:   PROPOFOL   Additional requests/questions:    Michael Lindsey   07/02/2023, 12:53 PM

## 2023-07-02 NOTE — Telephone Encounter (Signed)
Please arrange virtual telephone visit for cardiac clearance ?

## 2023-07-03 ENCOUNTER — Telehealth: Payer: Self-pay | Admitting: Cardiology

## 2023-07-03 NOTE — Telephone Encounter (Signed)
I called the pt back and left message to call back to set up tele pre op appt.

## 2023-07-03 NOTE — Telephone Encounter (Signed)
Patient returned pre-op call.

## 2023-07-03 NOTE — Telephone Encounter (Signed)
Pt c/o medication issue:  1. Name of Medication:   metoprolol tartrate (LOPRESSOR) 25 MG tablet   2. How are you currently taking this medication (dosage and times per day)?   As prescribed  3. Are you having a reaction (difficulty breathing--STAT)?   4. What is your medication issue?   Patient wants a call back to get a new prescription to have a dosage of this medication to be taken once daily so he would not have to cut tablet in have and only take it at night/evening.  Patient wants new prescription sent to [id number] - [place], [place] - [address] AT [facility] OF [address]

## 2023-07-07 MED ORDER — METOPROLOL SUCCINATE ER 25 MG PO TB24: 25.0000 mg | ORAL_TABLET | Freq: Every day | ORAL | 6 refills | Status: DC

## 2023-07-07 NOTE — Telephone Encounter (Signed)
Left detailed message informing pt MD approved switch to Toprol. Given instructions to stop Lopressor and start Toprol 25 mg next day. Aware sending  in 30 day supply w/ refills and to let us know if he would like it changed to 90 days. Advised to call back w/ questions.

## 2023-07-07 NOTE — Telephone Encounter (Signed)
Verified switching instruction. Patient verbalized understanding and agreeable to plan.

## 2023-07-07 NOTE — Telephone Encounter (Signed)
Patient returned RN's call regarding medication change.

## 2023-07-08 ENCOUNTER — Telehealth: Payer: Self-pay

## 2023-07-08 ENCOUNTER — Encounter: Payer: Self-pay | Admitting: Internal Medicine

## 2023-07-08 ENCOUNTER — Ambulatory Visit (INDEPENDENT_AMBULATORY_CARE_PROVIDER_SITE_OTHER): Payer: Medicare Other | Admitting: Internal Medicine

## 2023-07-08 VITALS — BP 106/74 | HR 72 | Temp 98.1°F | Ht 72.0 in | Wt 168.0 lb

## 2023-07-08 DIAGNOSIS — J309 Allergic rhinitis, unspecified: Secondary | ICD-10-CM

## 2023-07-08 DIAGNOSIS — M549 Dorsalgia, unspecified: Secondary | ICD-10-CM | POA: Diagnosis not present

## 2023-07-08 DIAGNOSIS — G8929 Other chronic pain: Secondary | ICD-10-CM | POA: Diagnosis not present

## 2023-07-08 DIAGNOSIS — R1084 Generalized abdominal pain: Secondary | ICD-10-CM | POA: Diagnosis not present

## 2023-07-08 DIAGNOSIS — K219 Gastro-esophageal reflux disease without esophagitis: Secondary | ICD-10-CM

## 2023-07-08 MED ORDER — DICYCLOMINE HCL 10 MG PO CAPS: 10.0000 mg | ORAL_CAPSULE | Freq: Four times a day (QID) | ORAL | 2 refills | Status: DC | PRN

## 2023-07-08 NOTE — Telephone Encounter (Signed)
Pt is scheduled for 11/4 at 9am. Med rec and consent done

## 2023-07-08 NOTE — Patient Instructions (Signed)
Please take all new medication as prescribed  - the dicyclomine for the GI symptoms as needed, and probiotic  Please continue all other medications as before, and refills have been done if requested.  Please have the pharmacy call with any other refills you may need.  Please continue your efforts at being more active, low cholesterol diet, and weight control.  Please keep your appointments with your specialists as you may have planned  Please go to the LAB at the blood drawing area for the tests to be done  You will be contacted by phone if any changes need to be made immediately.  Otherwise, you will receive a letter about your results with an explanation, but please check with MyChart first.

## 2023-07-08 NOTE — Progress Notes (Signed)
Patient ID: Michael Lindsey, male   DOB: June 11, 1951, 72 y.o.   MRN: [id number]        Chief Complaint: follow up GI upset symptoms, reflux, allergies,, chronic lbp       HPI:  Michael Lindsey is a 72 y.o. male here with diffuse mild abd pains but persistent after recent 2 wk eBike trip to [place] with diet change richer food, difficult sleep, associated with larger loose stools, bloating, gas, lower appetitie and early satiety without fever and Denies worsening reflux, dysphagia, n/v, or blood. No fever, no sick contacts.  Not better or worse with anything.   Pt denies chest pain, increased sob or doe, wheezing, orthopnea, PND, increased LE swelling, palpitations, dizziness or syncope.   Pt denies polydipsia, polyuria, or new focal neuro s/s.   Does have several wks ongoing nasal allergy symptoms with clearish congestion, itch and sneezing, without fever, pain, ST, cough, swelling or wheezing.  Pt continues to have recurring LBP without change in severity, bowel or bladder change, fever, wt loss,  worsening LE pain/numbness/weakness, gait change or falls but no worsening.         Wt Readings from Last 3 Encounters:  07/08/23 168 lb (76.2 kg)  03/30/23 169 lb 3.2 oz (76.7 kg)  02/02/23 167 lb 3.2 oz (75.8 kg)   BP Readings from Last 3 Encounters:  07/08/23 106/74  03/30/23 120/80  02/02/23 116/78         Past Medical History:  Diagnosis Date   Erectile dysfunction 11/01/2019   Fracture    L arm @ 11; 3 ribs with shoulder separation & PTX 2003   Gastritis 09/13/2007   EGD   GERD (gastroesophageal reflux disease)    Hepatitis A 1972   from exposure to septic tank which malfunctioned   Internal hemorrhoids 09/13/2007   COLON   Osteoporosis    Paroxysmal atrial fibrillation (HCC)    chads2vasc score is 1   Past Surgical History:  Procedure Laterality Date   ATRIAL FIBRILLATION ABLATION N/A 06/14/2020   Procedure: ATRIAL FIBRILLATION ABLATION;  Surgeon: Hillis Range, MD;  Location: [facility]  INVASIVE CV LAB;  Service: Cardiovascular;  Laterality: N/A;   cataract surgery  10/2014   COLONOSCOPY  2011   Dr Jarold Motto   HERNIA REPAIR     SKIN CANCER EXCISION  2011   R calf, Dr Donzetta Starch   UPPER GASTROINTESTINAL ENDOSCOPY  2003 & 2008    H pylori 2008    reports that he quit smoking about 42 years ago. His smoking use included cigarettes. He quit smokeless tobacco use about 42 years ago. He reports current alcohol use of about 3.0 standard drinks of alcohol per week. He reports that he does not use drugs. family history includes Alcohol abuse in his paternal uncle; Colon cancer (age of onset: 36) in his father; Coronary artery disease in his father; Depression in his brother and mother; Emphysema in his father; Tuberculosis in his mother. Allergies  Allergen Reactions   Chlorine     EYES WATER/NASAL CONGESTION   Current Outpatient Medications on File Prior to Visit  Medication Sig Dispense Refill   acetaminophen (TYLENOL) 500 MG tablet Take 500 mg by mouth as needed for moderate pain or headache.      Ascorbic Acid (VITAMIN C) 1000 MG tablet Take 1,000 mg by mouth daily.     Carboxymethylcellulose Sodium (LUBRICANT EYE DROPS OP) Place 1 drop into both eyes 3 (three) times daily.  Cholecalciferol (VITAMIN D-3) 125 MCG (5000 UT) TABS Take 1 tablet by mouth daily. 90 tablet 3   dextromethorphan-guaiFENesin (MUCINEX DM) 30-600 MG 12hr tablet Take 1 tablet by mouth as needed (congestion).      Flaxseed, Linseed, (FLAXSEED OIL) 1000 MG CAPS Take by mouth as directed.      metoprolol succinate (TOPROL XL) 25 MG 24 hr tablet Take 1 tablet (25 mg total) by mouth daily. 30 tablet 6   sildenafil (VIAGRA) 100 MG tablet Take 1 tablet (100 mg total) by mouth daily as needed for erectile dysfunction. 10 tablet 3   vitamin E 180 MG (400 UNITS) capsule Take 400 Units by mouth daily.     No current facility-administered medications on file prior to visit.        ROS:  All others reviewed and  negative.  Objective        PE:  BP 106/74 (BP Location: Left Arm, Patient Position: Sitting, Cuff Size: Normal)   Pulse 72   Temp 98.1 F (36.7 C) (Oral)   Ht 6' (1.829 m)   Wt 168 lb (76.2 kg)   SpO2 96%   BMI 22.78 kg/m                 Constitutional: Pt appears in NAD               HENT: Head: NCAT.                Right Ear: External ear normal.                 Left Ear: External ear normal.                Eyes: . Pupils are equal, round, and reactive to light. Conjunctivae and EOM are normal               Nose: without d/c or deformity               Neck: Neck supple. Gross normal ROM               Cardiovascular: Normal rate and regular rhythm.                 Pulmonary/Chest: Effort normal and breath sounds without rales or wheezing.                Abd:  Soft, NT, ND, + BS, no organomegaly - benign               Neurological: Pt is alert. At baseline orientation, motor grossly intact               Skin: Skin is warm. No rashes, no other new lesions, LE edema - one               Psychiatric: Pt behavior is normal without agitation   Micro: none  Cardiac tracings I have personally interpreted today:  none  Pertinent Radiological findings (summarize): none   Lab Results  Component Value Date   WBC 6.8 07/08/2023   HGB 14.9 07/08/2023   HCT 44.1 07/08/2023   PLT 207.0 07/08/2023   GLUCOSE 74 07/08/2023   CHOL 183 11/14/2022   TRIG 58.0 11/14/2022   HDL 66.90 11/14/2022   LDLCALC 105 (H) 11/14/2022   ALT 16 07/08/2023   AST 20 07/08/2023   NA 136 07/08/2023   K 4.1 07/08/2023   CL 104 07/08/2023   CREATININE 0.89 07/08/2023  BUN 14 07/08/2023   CO2 27 07/08/2023   TSH 2.24 11/14/2022   PSA 0.56 11/14/2022   HGBA1C 5.4 11/14/2022   Assessment/Plan:  Michael Lindsey is a 72 y.o. White or Caucasian [1] male with  has a past medical history of Erectile dysfunction (11/01/2019), Fracture, Gastritis (09/13/2007), GERD (gastroesophageal reflux disease), Hepatitis  A (1972), Internal hemorrhoids (09/13/2007), Osteoporosis, and Paroxysmal atrial fibrillation (HCC).  Allergic rhinitis Mild to mod, for otc allegra nasacort asd prn,,  to f/u any worsening symptoms or concerns  Esophageal reflux Stable overall, cont tums prn  Chronic midline back pain Overall stable to improved, tolerated eBike trip recently, cont current tx  Abdominal pain Exam benign, for probiotic otc trial, dicyclomine prn, lab as ordered including cbc  Followup: Return if symptoms worsen or fail to improve.  Oliver Barre, MD 07/11/2023 3:48 PM  Medical Group [facility]

## 2023-07-08 NOTE — Telephone Encounter (Signed)
Pt is scheduled for 11/4 at 9am. Med rec and consent done    Patient Consent for Virtual Visit        Michael Lindsey has provided verbal consent on 07/08/2023 for a virtual visit (video or telephone).   CONSENT FOR VIRTUAL VISIT FOR:  Michael Lindsey  By participating in this virtual visit I agree to the following:  I hereby voluntarily request, consent and authorize [facility] and its employed or contracted physicians, physician assistants, nurse practitioners or other licensed health care professionals (the Practitioner), to provide me with telemedicine health care services (the "Services") as deemed necessary by the treating Practitioner. I acknowledge and consent to receive the Services by the Practitioner via telemedicine. I understand that the telemedicine visit will involve communicating with the Practitioner through live audiovisual communication technology and the disclosure of certain medical information by electronic transmission. I acknowledge that I have been given the opportunity to request an in-person assessment or other available alternative prior to the telemedicine visit and am voluntarily participating in the telemedicine visit.  I understand that I have the right to withhold or withdraw my consent to the use of telemedicine in the course of my care at any time, without affecting my right to future care or treatment, and that the Practitioner or I may terminate the telemedicine visit at any time. I understand that I have the right to inspect all information obtained and/or recorded in the course of the telemedicine visit and may receive copies of available information for a reasonable fee.  I understand that some of the potential risks of receiving the Services via telemedicine include:  Delay or interruption in medical evaluation due to technological equipment failure or disruption; Information transmitted may not be sufficient (e.g. poor resolution of images) to  allow for appropriate medical decision making by the Practitioner; and/or  In rare instances, security protocols could fail, causing a breach of personal health information.  Furthermore, I acknowledge that it is my responsibility to provide information about my medical history, conditions and care that is complete and accurate to the best of my ability. I acknowledge that Practitioner's advice, recommendations, and/or decision may be based on factors not within their control, such as incomplete or inaccurate data provided by me or distortions of diagnostic images or specimens that may result from electronic transmissions. I understand that the practice of medicine is not an exact science and that Practitioner makes no warranties or guarantees regarding treatment outcomes. I acknowledge that a copy of this consent can be made available to me via my patient portal [facility] MyChart), or I can request a printed copy by calling the office of [facility].    I understand that my insurance will be billed for this visit.   I have read or had this consent read to me. I understand the contents of this consent, which adequately explains the benefits and risks of the Services being provided via telemedicine.  I have been provided ample opportunity to ask questions regarding this consent and the Services and have had my questions answered to my satisfaction. I give my informed consent for the services to be provided through the use of telemedicine in my medical care

## 2023-07-09 LAB — HEPATIC FUNCTION PANEL
ALT: 16 U/L (ref 0–53)
AST: 20 U/L (ref 0–37)
Albumin: 3.9 g/dL (ref 3.5–5.2)
Alkaline Phosphatase: 57 U/L (ref 39–117)
Bilirubin, Direct: 0.1 mg/dL (ref 0.0–0.3)
Total Bilirubin: 0.6 mg/dL (ref 0.2–1.2)
Total Protein: 6.4 g/dL (ref 6.0–8.3)

## 2023-07-09 LAB — BASIC METABOLIC PANEL
BUN: 14 mg/dL (ref 6–23)
CO2: 27 meq/L (ref 19–32)
Calcium: 9 mg/dL (ref 8.4–10.5)
Chloride: 104 meq/L (ref 96–112)
Creatinine, Ser: 0.89 mg/dL (ref 0.40–1.50)
GFR: 85.08 mL/min (ref >60.00)
Glucose, Bld: 74 mg/dL (ref 70–99)
Potassium: 4.1 meq/L (ref 3.5–5.1)
Sodium: 136 meq/L (ref 135–145)

## 2023-07-09 LAB — CBC WITH DIFFERENTIAL/PLATELET
Basophils Absolute: 0.1 10*3/uL (ref 0.0–0.1)
Basophils Relative: 1.8 % (ref 0.0–3.0)
Eosinophils Absolute: 0.5 10*3/uL (ref 0.0–0.7)
Eosinophils Relative: 6.9 % — ABNORMAL HIGH (ref 0.0–5.0)
HCT: 44.1 % (ref 39.0–52.0)
Hemoglobin: 14.9 g/dL (ref 13.0–17.0)
Lymphocytes Relative: 21.2 % (ref 12.0–46.0)
Lymphs Abs: 1.4 10*3/uL (ref 0.7–4.0)
MCHC: 33.8 g/dL (ref 30.0–36.0)
MCV: 97.2 fL (ref 78.0–100.0)
Monocytes Absolute: 0.8 10*3/uL (ref 0.1–1.0)
Monocytes Relative: 12.4 % — ABNORMAL HIGH (ref 3.0–12.0)
Neutro Abs: 3.9 10*3/uL (ref 1.4–7.7)
Neutrophils Relative %: 57.7 % (ref 43.0–77.0)
Platelets: 207 10*3/uL (ref 150.0–400.0)
RBC: 4.54 Mil/uL (ref 4.22–5.81)
RDW: 13.4 % (ref 11.5–15.5)
WBC: 6.8 10*3/uL (ref 4.0–10.5)

## 2023-07-09 LAB — URINALYSIS, ROUTINE W REFLEX MICROSCOPIC
Bilirubin Urine: NEGATIVE
Hgb urine dipstick: NEGATIVE
Ketones, ur: NEGATIVE
Leukocytes,Ua: NEGATIVE
Nitrite: NEGATIVE
RBC / HPF: NONE SEEN (ref 0–?)
Specific Gravity, Urine: <=1.005 — AB (ref 1.000–1.030)
Total Protein, Urine: NEGATIVE
Urine Glucose: NEGATIVE
Urobilinogen, UA: 0.2 (ref 0.0–1.0)
WBC, UA: NONE SEEN (ref 0–?)
pH: 6.5 (ref 5.0–8.0)

## 2023-07-09 LAB — LIPASE: Lipase: 109 U/L — ABNORMAL HIGH (ref 11.0–59.0)

## 2023-07-11 ENCOUNTER — Encounter: Payer: Self-pay | Admitting: Internal Medicine

## 2023-07-11 DIAGNOSIS — R109 Unspecified abdominal pain: Secondary | ICD-10-CM | POA: Insufficient documentation

## 2023-07-11 NOTE — Assessment & Plan Note (Signed)
Mild to mod, for otc allegra nasacort asd prn,,  to f/u any worsening symptoms or concerns

## 2023-07-11 NOTE — Assessment & Plan Note (Signed)
Stable overall , cont tums prn

## 2023-07-11 NOTE — Assessment & Plan Note (Signed)
Overall stable to improved, tolerated eBike trip recently, cont current tx

## 2023-07-11 NOTE — Assessment & Plan Note (Signed)
Exam benign, for probiotic otc trial, dicyclomine prn, lab as ordered including cbc

## 2023-07-12 ENCOUNTER — Ambulatory Visit (HOSPITAL_COMMUNITY)
Admission: EM | Admit: 2023-07-12 | Discharge: 2023-07-12 | Disposition: A | Discharge status: Home / Self Care | Payer: Medicare Other | Attending: Nurse Practitioner | Admitting: Nurse Practitioner

## 2023-07-12 ENCOUNTER — Encounter (HOSPITAL_COMMUNITY): Payer: Self-pay | Admitting: Emergency Medicine

## 2023-07-12 ENCOUNTER — Other Ambulatory Visit: Payer: Self-pay

## 2023-07-12 DIAGNOSIS — R21 Rash and other nonspecific skin eruption: Secondary | ICD-10-CM | POA: Diagnosis not present

## 2023-07-12 DIAGNOSIS — R238 Other skin changes: Secondary | ICD-10-CM

## 2023-07-12 MED ORDER — TRIAMCINOLONE ACETONIDE 0.025 % EX OINT
1.0000 | TOPICAL_OINTMENT | Freq: Two times a day (BID) | CUTANEOUS | 0 refills | Status: AC
Start: 2023-07-12 — End: ?

## 2023-07-12 NOTE — ED Provider Notes (Signed)
[facility]    CSN: [id number] Arrival date & time: 07/12/23  1714      History   Chief Complaint Chief Complaint  Patient presents with   Rash    HPI Michael Lindsey is a 72 y.o. male.   Patient presents today for skin irritation to upper left chest.  Reports he had a "white mole" that he removed from his skin about 1 week ago.  He was then applying Neosporin ointment to the area and for the past 4-5 days has been having redness, swelling, itching, and burning of his skin around the area of concern.  He feels the area is spreading.  No fever, body aches or chills.  Area is not warm to touch.    Past Medical History:  Diagnosis Date   Erectile dysfunction 11/01/2019   Fracture    L arm @ 10; 3 ribs with shoulder separation & PTX 2003   Gastritis 09/13/2007   EGD   GERD (gastroesophageal reflux disease)    Hepatitis A 1972   from exposure to septic tank which malfunctioned   Internal hemorrhoids 09/13/2007   COLON   Osteoporosis    Paroxysmal atrial fibrillation (HCC)    chads2vasc score is 1    Patient Active Problem List   Diagnosis Date Noted   Abdominal pain 07/11/2023   Inguinal hernia 11/15/2022   Lumbar spinal stenosis 11/11/2022   Chronic midline back pain 06/26/2022   Pain in joint of right hip 05/01/2022   RUQ pain 04/24/2022   Right flank pain 04/24/2022   Sensorineural hearing loss (SNHL), bilateral 04/03/2022   Tinnitus of both ears 04/03/2022   Acute dysfunction of right eustachian tube 10/24/2021   Throat pain 02/15/2020   Erectile dysfunction 11/01/2019   Chronic anticoagulation 11/01/2019   Pain in left knee 05/03/2019   Bilateral primary osteoarthritis of knee 06/18/2018   Leg pain 03/23/2018   PAF (paroxysmal atrial fibrillation) (HCC) 11/17/2016   Encounter for well adult exam with abnormal findings 10/20/2016   Palpitations 09/24/2016   Dyspnea 09/24/2016   Allergic rhinitis 06/01/2013   Skin cancer 08/11/2012   Dysphagia  12/08/2007   Osteopenia 09/14/2007   Esophageal reflux 04/13/2007    Past Surgical History:  Procedure Laterality Date   ATRIAL FIBRILLATION ABLATION N/A 06/14/2020   Procedure: ATRIAL FIBRILLATION ABLATION;  Surgeon: Hillis Range, MD;  [facility];  Service: Cardiovascular;  Laterality: N/A;   cataract surgery  10/2014   COLONOSCOPY  2011   Dr Jarold Motto   HERNIA REPAIR     SKIN CANCER EXCISION  2011   R calf, Dr Donzetta Starch   UPPER GASTROINTESTINAL ENDOSCOPY  2003 & 2008    H pylori 2008       Home Medications    Prior to Admission medications   Medication Sig Start Date End Date Taking? Authorizing Provider  triamcinolone (KENALOG) 0.025 % ointment Apply 1 Application topically 2 (two) times daily. Apply small amount twice daily to affected area to help with skin irritation 07/12/23  Yes Valentino Nose, NP  acetaminophen (TYLENOL) 500 MG tablet Take 500 mg by mouth as needed for moderate pain or headache.     [provider]  Ascorbic Acid (VITAMIN C) 1000 MG tablet Take 1,000 mg by mouth daily.    [provider]  Carboxymethylcellulose Sodium (LUBRICANT EYE DROPS OP) Place 1 drop into both eyes 3 (three) times daily.    [provider]  Cholecalciferol (VITAMIN D-3)  125 MCG (5000 UT) TABS Take 1 tablet by mouth daily. 04/24/21   Hilts, Casimiro Needle, MD  dextromethorphan-guaiFENesin [facility] DM) 30-600 MG 12hr tablet Take 1 tablet by mouth as needed (congestion).     [provider]  dicyclomine (BENTYL) 10 MG capsule Take 1 capsule (10 mg total) by mouth 4 (four) times daily as needed for spasms. 07/08/23   Corwin Levins, MD  Flaxseed, Linseed, (FLAXSEED OIL) 1000 MG CAPS Take by mouth as directed.     [provider]  metoprolol succinate (TOPROL XL) 25 MG 24 hr tablet Take 1 tablet (25 mg total) by mouth daily. 07/07/23   Camnitz, Andree Coss, MD  sildenafil (VIAGRA) 100 MG tablet Take 1 tablet (100 mg total) by mouth  daily as needed for erectile dysfunction. 04/08/23   Corky Crafts, MD  vitamin E 180 MG (400 UNITS) capsule Take 400 Units by mouth daily.    [provider]    Family History Family History  Problem Relation Age of Onset   Depression Mother    Tuberculosis Mother    Coronary artery disease Father    Emphysema Father    Colon cancer Father 80   Depression Brother    Alcohol abuse Paternal Uncle    Diabetes Neg Hx    Stroke Neg Hx     Social History Social History   Tobacco Use   Smoking status: Former    Current packs/day: 0.00    Types: Cigarettes    Quit date: 10/06/1980    Years since quitting: 42.7   Smokeless tobacco: Former    Quit date: 1982   Tobacco comments:    intermittent smoker over 4 years in high school & again in 1983 before quitting, never > 2 mos @ a time,never > 1/2 ppd  Vaping Use   Vaping status: Never Used  Substance Use Topics   Alcohol use: Yes    Alcohol/week: 3.0 standard drinks of alcohol    Types: 3 Standard drinks or equivalent per week    Comment: couple of mix drinks or non alcoholic beer 02/02/23   Drug use: No     Allergies   Chlorine   Review of Systems Review of Systems Per HPI  Physical Exam Triage Vital Signs ED Triage Vitals  Encounter Vitals Group     BP 07/12/23 1725 (!) 146/83     Systolic BP Percentile --      Diastolic BP Percentile --      Pulse Rate 07/12/23 1725 60     Resp 07/12/23 1725 14     Temp 07/12/23 1725 98.2 F (36.8 C)     Temp Source 07/12/23 1725 Oral     SpO2 07/12/23 1725 96 %     Weight --      Height --      Head Circumference --      Peak Flow --      Pain Score 07/12/23 1722 7     Pain Loc --      Pain Education --      Exclude from Growth Chart --    No data found.  Updated Vital Signs BP (!) 146/83 (BP Location: Left Arm)   Pulse 60   Temp 98.2 F (36.8 C) (Oral)   Resp 14   SpO2 96%   Visual Acuity Right Eye Distance:   Left Eye Distance:   Bilateral  Distance:    Right Eye Near:   Left Eye Near:  Bilateral Near:     Physical Exam Vitals and nursing note reviewed.  Constitutional:      General: He is not in acute distress.    Appearance: Normal appearance. He is not toxic-appearing.  HENT:     Head: Normocephalic and atraumatic.     Mouth/Throat:     Mouth: Mucous membranes are moist.     Pharynx: Oropharynx is clear.  Pulmonary:     Effort: Pulmonary effort is normal. No respiratory distress.  Skin:    General: Skin is warm and dry.     Capillary Refill: Capillary refill takes less than 2 seconds.     Findings: Erythema present. No rash.          Comments: Erythematous plaque to left upper chest in approximately area marked measuring 2 cm x 2 cm with tiny vesicles scattered throughout the area; there is weeping of serous fluid and surrounding erythema.  No purulent drainage, odor, tenderness to touch.  No fluctuance.   Neurological:     Mental Status: He is alert and oriented to person, place, and time.  Psychiatric:        Behavior: Behavior is cooperative.      UC Treatments / Results  Labs (all labs ordered are listed, but only abnormal results are displayed) Labs Reviewed - No data to display  EKG   Radiology No results found.  Procedures Procedures (including critical care time)  Medications Ordered in UC Medications - No data to display  Initial Impression / Assessment and Plan / UC Course  I have reviewed the triage vital signs and the nursing notes.  Pertinent labs & imaging results that were available during my care of the patient were reviewed by me and considered in my medical decision making (see chart for details).   Patient is well-appearing, normotensive, afebrile, not tachycardic, not tachypneic, oxygenating well on room air.    1. Rash 2. Skin irritation No red flags in history or on examination Low suspicion for bacterial skin infection given no purulence, warmth.  Area is  blanchable. Suspect allergic reaction to Neosporin; recommended discontinuance  Start topical corticosteroid ointment twice daily until itching resolves Wound care applied today Avoid Neosporin in future and use plain emollient like Vaseline/Aquaphor ER and return precautions discussed with patient  The patient was given the opportunity to ask questions.  All questions answered to their satisfaction.  The patient is in agreement to this plan.    Final Clinical Impressions(s) / UC Diagnoses   Final diagnoses:  Rash  Skin irritation     Discharge Instructions      Start using the Kenalog ointment up to twice daily to the affected area to help with skin irritation.  As we discussed, I believe you are allergic to the neosporin, so do not use it anymore.  Keep area covered with nonadherent gauze and bandage until it heals.  Seek care if symptoms do not improve with this treatment.    ED Prescriptions     Medication Sig Dispense Auth. Provider   triamcinolone (KENALOG) 0.025 % ointment Apply 1 Application topically 2 (two) times daily. Apply small amount twice daily to affected area to help with skin irritation 15 g Valentino Nose, NP      PDMP not reviewed this encounter.   Valentino Nose, NP 07/12/23 1743

## 2023-07-12 NOTE — ED Triage Notes (Signed)
Patient states he has a rash that has been there for 4-5 days. States there was also a "spot" that disappeared before the rash spread across his chest. Significant itching, and burning. He has tried neosporin with no relief.

## 2023-07-12 NOTE — Discharge Instructions (Signed)
Start using the Kenalog ointment up to twice daily to the affected area to help with skin irritation.  As we discussed, I believe you are allergic to the neosporin, so do not use it anymore.  Keep area covered with nonadherent gauze and bandage until it heals.  Seek care if symptoms do not improve with this treatment.

## 2023-07-13 ENCOUNTER — Telehealth: Payer: Self-pay

## 2023-07-13 NOTE — Telephone Encounter (Signed)
Pharmacy Patient Advocate Encounter   Received notification from CoverMyMeds that prior authorization for Dicyclomine HCl 10MG  capsules is required/requested.   Insurance verification completed.   The patient is insured through [employer] .   Per test claim: PA required; PA started via CoverMyMeds. KEY [id number] . Waiting for clinical questions to populate.

## 2023-07-14 ENCOUNTER — Other Ambulatory Visit: Payer: Self-pay | Admitting: Nurse Practitioner

## 2023-07-14 DIAGNOSIS — R748 Abnormal levels of other serum enzymes: Secondary | ICD-10-CM

## 2023-07-14 NOTE — Telephone Encounter (Signed)
Clinical questions answered. PA submitted

## 2023-07-15 NOTE — Telephone Encounter (Signed)
Pharmacy Patient Advocate Encounter  Received notification from [facility] that Prior Authorization for Dicyclomine HCl 10MG  capsules has been DENIED.  Full denial letter will be uploaded to the media tab. See denial reason below.   PA #/Case ID/Reference #: [id number]   Denied. We denied this request under Medicare Part D because Dicyclomine is not being prescribed for an FDA labeled or medically accepted use. A medically accepted use is approved by the FDA or supported by the [facility] Formulary Service Drug Information and the DRUGDEX Information System. In this case, Dicyclomine is not being prescribed in accordance with an FDA labeled use or use accepted by the Medicare approved drug compendia.

## 2023-07-16 ENCOUNTER — Ambulatory Visit
Admission: RE | Admit: 2023-07-16 | Discharge: 2023-07-16 | Disposition: A | Discharge status: Home / Self Care | Payer: Medicare Other | Source: Ambulatory Visit | Attending: Nurse Practitioner | Admitting: Nurse Practitioner

## 2023-07-16 DIAGNOSIS — I700 Atherosclerosis of aorta: Secondary | ICD-10-CM | POA: Diagnosis not present

## 2023-07-16 DIAGNOSIS — R194 Change in bowel habit: Secondary | ICD-10-CM | POA: Diagnosis not present

## 2023-07-16 DIAGNOSIS — N281 Cyst of kidney, acquired: Secondary | ICD-10-CM | POA: Diagnosis not present

## 2023-07-16 DIAGNOSIS — K429 Umbilical hernia without obstruction or gangrene: Secondary | ICD-10-CM | POA: Diagnosis not present

## 2023-07-16 DIAGNOSIS — R748 Abnormal levels of other serum enzymes: Secondary | ICD-10-CM

## 2023-07-16 MED ORDER — IOPAMIDOL (ISOVUE-300) INJECTION 61%
200.0000 mL | Freq: Once | INTRAVENOUS | Status: AC | PRN
  Administered 2023-07-16: 100 mL via INTRAVENOUS

## 2023-08-10 ENCOUNTER — Ambulatory Visit: Payer: Medicare Other | Attending: Cardiology | Admitting: Student

## 2023-08-10 DIAGNOSIS — Z01810 Encounter for preprocedural cardiovascular examination: Secondary | ICD-10-CM

## 2023-08-10 NOTE — Progress Notes (Signed)
Virtual Visit via Telephone Note   Because of Michael Lindsey's co-morbid illnesses, he is at least at moderate risk for complications without adequate follow up.  This format is felt to be most appropriate for this patient at this time.  The patient did not have access to video technology/had technical difficulties with video requiring transitioning to audio format only (telephone).  All issues noted in this document were discussed and addressed.  No physical exam could be performed with this format.  Please refer to the patient's chart for his consent to telehealth for [facility].  Evaluation Performed:  Preoperative cardiovascular risk assessment _____________   Date:  08/10/2023   Patient ID:  Michael Lindsey, DOB May 31, 1951, MRN [id number] Patient Location:  Home Provider location:   Office  Primary Care Provider:  Corwin Levins, MD Primary Cardiologist:  None  Chief Complaint / Patient Profile   72 y.o. y/o male with a h/o PAF s/p afib ablation September 2021 not on anticoagulation, frequent PVCs, GERD who is pending colonoscopy by Dr. Matthias Hughs and presents today for telephonic preoperative cardiovascular risk assessment.  History of Present Illness    Michael Lindsey is a 72 y.o. male who presents via audio/video conferencing for a telehealth visit today.  Pt was last seen in cardiology clinic on 03/30/2023 by Dr. Elberta Fortis.  At that time THESEUS BIRNIE was stable from a cardiac standpoint.  The patient is now pending procedure as outlined above. Since his last visit, he is doing well. Patient denies shortness of breath, dyspnea on exertion, lower extremity edema, orthopnea or PND. No chest pain, pressure, or tightness. No palpitations. He is very active working out with a trainer twice a week, riding his bicycle 4-5 miles, and golfing.   Past Medical History    Past Medical History:  Diagnosis Date   Erectile dysfunction 11/01/2019   Fracture    L arm @ 11; 3 ribs  with shoulder separation & PTX 2003   Gastritis 09/13/2007   EGD   GERD (gastroesophageal reflux disease)    Hepatitis A 1972   from exposure to septic tank which malfunctioned   Internal hemorrhoids 09/13/2007   COLON   Osteoporosis    Paroxysmal atrial fibrillation (HCC)    chads2vasc score is 1   Past Surgical History:  Procedure Laterality Date   ATRIAL FIBRILLATION ABLATION N/A 06/14/2020   Procedure: ATRIAL FIBRILLATION ABLATION;  Surgeon: Hillis Range, MD;  [facility];  Service: Cardiovascular;  Laterality: N/A;   cataract surgery  10/2014   COLONOSCOPY  2011   Dr Jarold Motto   HERNIA REPAIR     SKIN CANCER EXCISION  2011   R calf, Dr Donzetta Starch   UPPER GASTROINTESTINAL ENDOSCOPY  2003 & 2008    H pylori 2008    Allergies  Allergies  Allergen Reactions   Chlorine     EYES WATER/NASAL CONGESTION    Home Medications    Prior to Admission medications   Medication Sig Start Date End Date Taking? Authorizing Provider  acetaminophen (TYLENOL) 500 MG tablet Take 500 mg by mouth as needed for moderate pain or headache.     [provider]  Ascorbic Acid (VITAMIN C) 1000 MG tablet Take 1,000 mg by mouth daily.    [provider]  Carboxymethylcellulose Sodium (LUBRICANT EYE DROPS OP) Place 1 drop into both eyes 3 (three) times daily.    [provider]  Cholecalciferol (VITAMIN D-3) 125 MCG (5000  UT) TABS Take 1 tablet by mouth daily. 04/24/21   Hilts, Casimiro Needle, MD  dextromethorphan-guaiFENesin [facility] DM) 30-600 MG 12hr tablet Take 1 tablet by mouth as needed (congestion).     [provider]  dicyclomine (BENTYL) 10 MG capsule Take 1 capsule (10 mg total) by mouth 4 (four) times daily as needed for spasms. 07/08/23   Corwin Levins, MD  Flaxseed, Linseed, (FLAXSEED OIL) 1000 MG CAPS Take by mouth as directed.     [provider]  metoprolol succinate (TOPROL XL) 25 MG 24 hr tablet Take 1 tablet (25 mg total) by mouth  daily. 07/07/23   Camnitz, Andree Coss, MD  sildenafil (VIAGRA) 100 MG tablet Take 1 tablet (100 mg total) by mouth daily as needed for erectile dysfunction. 04/08/23   Corky Crafts, MD  triamcinolone (KENALOG) 0.025 % ointment Apply 1 Application topically 2 (two) times daily. Apply small amount twice daily to affected area to help with skin irritation 07/12/23   Valentino Nose, NP  vitamin E 180 MG (400 UNITS) capsule Take 400 Units by mouth daily.    [provider]    Physical Exam    Vital Signs:  KYPTON ELTRINGHAM does not have vital signs available for review today.  Given telephonic nature of communication, physical exam is limited. AAOx3. NAD. Normal affect.  Speech and respirations are unlabored.  Accessory Clinical Findings    None  Assessment & Plan    Primary Cardiologist: None  Preoperative cardiovascular risk assessment. Colonoscopy by Dr. Matthias Hughs.  Chart reviewed as part of pre-operative protocol coverage. According to the RCRI, patient has a 0.4% risk of MACE. Patient reports activity equivalent to >4.0 METS (very active working out with a trainer twice a week, riding bike at least once a week, golfing).   Given past medical history and time since last visit, based on ACC/AHA guidelines, PARKER WHERLEY would be at acceptable risk for the planned procedure without further cardiovascular testing.   Patient was advised that if he develops new symptoms prior to surgery to contact our office to arrange a follow-up appointment.  he verbalized understanding.  I will route this recommendation to the requesting party via Epic fax function.  Please call with questions.  Time:   Today, I have spent 5 minutes with the patient with telehealth technology discussing medical history, symptoms, and management plan.     Carlos Levering, NP  08/10/2023, 7:19 AM

## 2023-08-18 DIAGNOSIS — Z860101 Personal history of adenomatous and serrated colon polyps: Secondary | ICD-10-CM | POA: Diagnosis not present

## 2023-08-18 DIAGNOSIS — Z09 Encounter for follow-up examination after completed treatment for conditions other than malignant neoplasm: Secondary | ICD-10-CM | POA: Diagnosis not present

## 2023-08-18 DIAGNOSIS — Z800 Family history of malignant neoplasm of digestive organs: Secondary | ICD-10-CM | POA: Diagnosis not present

## 2023-08-18 LAB — HM COLONOSCOPY

## 2023-08-20 ENCOUNTER — Encounter: Payer: Self-pay | Admitting: Internal Medicine

## 2023-08-20 DIAGNOSIS — L820 Inflamed seborrheic keratosis: Secondary | ICD-10-CM | POA: Diagnosis not present

## 2023-08-20 DIAGNOSIS — L814 Other melanin hyperpigmentation: Secondary | ICD-10-CM | POA: Diagnosis not present

## 2023-08-20 DIAGNOSIS — L570 Actinic keratosis: Secondary | ICD-10-CM | POA: Diagnosis not present

## 2023-08-20 DIAGNOSIS — D1801 Hemangioma of skin and subcutaneous tissue: Secondary | ICD-10-CM | POA: Diagnosis not present

## 2023-08-20 DIAGNOSIS — Z85828 Personal history of other malignant neoplasm of skin: Secondary | ICD-10-CM | POA: Diagnosis not present

## 2023-09-21 ENCOUNTER — Encounter: Payer: Self-pay | Admitting: Student

## 2023-09-21 ENCOUNTER — Ambulatory Visit: Payer: Medicare Other | Attending: Student | Admitting: Student

## 2023-09-21 ENCOUNTER — Ambulatory Visit: Payer: Medicare Other | Admitting: Cardiology

## 2023-09-21 VITALS — BP 98/62 | HR 70 | Ht 72.0 in | Wt 168.2 lb

## 2023-09-21 DIAGNOSIS — I493 Ventricular premature depolarization: Secondary | ICD-10-CM

## 2023-09-21 DIAGNOSIS — Z01810 Encounter for preprocedural cardiovascular examination: Secondary | ICD-10-CM

## 2023-09-21 DIAGNOSIS — I480 Paroxysmal atrial fibrillation: Secondary | ICD-10-CM

## 2023-09-21 NOTE — Patient Instructions (Signed)
Medication Instructions:  Your physician recommends that you continue on your current medications as directed. Please refer to the Current Medication list given to you today.  *If you need a refill on your cardiac medications before your next appointment, please call your pharmacy*  Lab Work: None ordered If you have labs (blood work) drawn today and your tests are completely normal, you will receive your results only by: MyChart Message (if you have MyChart) OR A paper copy in the mail If you have any lab test that is abnormal or we need to change your treatment, we will call you to review the results.  Follow-Up: At [facility], you and your health needs are our priority.  As part of our continuing mission to provide you with exceptional heart care, we have created designated Provider Care Teams.  These Care Teams include your primary Cardiologist (physician) and Advanced Practice Providers (APPs -  Physician Assistants and Nurse Practitioners) who all work together to provide you with the care you need, when you need it.  Your next appointment:   6 month(s)  Provider:   Will Camnitz, MD  

## 2023-09-21 NOTE — Progress Notes (Signed)
  Electrophysiology Office Note:   Date:  09/21/2023  ID:  Michael Lindsey, DOB [birth year], MRN [id number]  Primary Cardiologist: None Electrophysiologist: Will Jorja Loa, MD      History of Present Illness:   Michael Lindsey is a 72 y.o. male with h/o PAF, secondary hypercoaguable state, CAD, and PVCs seen today for routine electrophysiology followup.   Since last being seen in our clinic the patient reports doing well. Overall,  he denies chest pain, dyspnea, PND, orthopnea, nausea, vomiting, dizziness, syncope, edema, weight gain, or early satiety. Occasionally gets a bradycardia alert from his watch (asymptomatic) in the setting of his PVCs.  Review of systems complete and found to be negative unless listed in HPI.   EP Information / Studies Reviewed:    EKG is ordered today. Personal review as below.  EKG Interpretation Date/Time:  Monday September 21 2023 11:15:34 EST Ventricular Rate:  70 PR Interval:  174 QRS Duration:  92 QT Interval:  374 QTC Calculation: 403 R Axis:   -4  Text Interpretation: Sinus rhythm with frequent Premature ventricular complexes Up to bigeminy Confirmed by Maxine Glenn [phone]) on 09/21/2023 11:21:50 AM    Arrhythmia History  S/p AF Ablation 06/2020  Echo 03/2023 LVEF 60-65%  Physical Exam:   VS:  BP 98/62   Pulse 70   Ht 6' (1.829 m)   Wt 168 lb 3.2 oz (76.3 kg)   SpO2 99%   BMI 22.81 kg/m    Wt Readings from Last 3 Encounters:  09/21/23 168 lb 3.2 oz (76.3 kg)  07/08/23 168 lb (76.2 kg)  03/30/23 169 lb 3.2 oz (76.7 kg)     GEN: Well nourished, well developed in no acute distress NECK: No JVD; No carotid bruits CARDIAC: Regular rate and rhythm with occasional ectopy, no murmurs, rubs, gallops RESPIRATORY:  Clear to auscultation without rales, wheezing or rhonchi  ABDOMEN: Soft, non-tender, non-distended EXTREMITIES:  No edema; No deformity   ASSESSMENT AND PLAN:    Paroxysmal AF 2-8% burden by apple watch, may be  confounded by PVCs Not on [facility] s/p ablation  CHA2DS2/VASc is only 1. Continue toprol 25 mg daily  CAD Denies s/s ischemia Calcium score of 305 on CT  PVCs Up to 15% on prior monitor.  Minimally symptomatic and able to do ADLs, with normal EF 03/2023 Continue to follow on BB    Follow up with Dr. Elberta Fortis in 6 months  Signed, Graciella Freer, PA-C

## 2023-11-02 DIAGNOSIS — K080 Exfoliation of teeth due to systemic causes: Secondary | ICD-10-CM | POA: Diagnosis not present

## 2023-11-13 ENCOUNTER — Encounter: Payer: Medicare Other | Admitting: Internal Medicine

## 2023-11-23 ENCOUNTER — Ambulatory Visit (INDEPENDENT_AMBULATORY_CARE_PROVIDER_SITE_OTHER): Payer: Medicare Other | Admitting: Internal Medicine

## 2023-11-23 ENCOUNTER — Encounter: Payer: Self-pay | Admitting: Internal Medicine

## 2023-11-23 VITALS — BP 110/64 | HR 64 | Temp 98.0°F | Ht 72.0 in | Wt 165.0 lb

## 2023-11-23 DIAGNOSIS — E559 Vitamin D deficiency, unspecified: Secondary | ICD-10-CM | POA: Diagnosis not present

## 2023-11-23 DIAGNOSIS — Z125 Encounter for screening for malignant neoplasm of prostate: Secondary | ICD-10-CM

## 2023-11-23 DIAGNOSIS — Z0001 Encounter for general adult medical examination with abnormal findings: Secondary | ICD-10-CM

## 2023-11-23 DIAGNOSIS — K219 Gastro-esophageal reflux disease without esophagitis: Secondary | ICD-10-CM

## 2023-11-23 DIAGNOSIS — Z0000 Encounter for general adult medical examination without abnormal findings: Secondary | ICD-10-CM

## 2023-11-23 DIAGNOSIS — N529 Male erectile dysfunction, unspecified: Secondary | ICD-10-CM | POA: Diagnosis not present

## 2023-11-23 DIAGNOSIS — E538 Deficiency of other specified B group vitamins: Secondary | ICD-10-CM | POA: Diagnosis not present

## 2023-11-23 DIAGNOSIS — E785 Hyperlipidemia, unspecified: Secondary | ICD-10-CM

## 2023-11-23 DIAGNOSIS — M8580 Other specified disorders of bone density and structure, unspecified site: Secondary | ICD-10-CM | POA: Diagnosis not present

## 2023-11-23 DIAGNOSIS — R739 Hyperglycemia, unspecified: Secondary | ICD-10-CM

## 2023-11-23 MED ORDER — SILDENAFIL CITRATE 100 MG PO TABS: 100.0000 mg | ORAL_TABLET | Freq: Every day | ORAL | 5 refills | Status: AC | PRN

## 2023-11-23 NOTE — Progress Notes (Unsigned)
Patient ID: Michael Lindsey, male   DOB: 17-Apr-1951, 73 y.o.   MRN: [id number]         Chief Complaint:: wellness exam and osteopenia, ED, GERD       HPI:  Michael Lindsey is a 73 y.o. male here for wellness exam; declines covid booster, o/w up to date                        Also diarrhea much improved with probiotic. Pt denies chest pain, increased sob or doe, wheezing, orthopnea, PND, increased LE swelling, palpitations, dizziness or syncope.   Pt denies polydipsia, polyuria, or new focal neuro s/s.    Pt denies fever, wt loss, night sweats, loss of appetite, or other constitutional symptoms        Wt Readings from Last 3 Encounters:  11/23/23 165 lb (74.8 kg)  09/21/23 168 lb 3.2 oz (76.3 kg)  07/08/23 168 lb (76.2 kg)   BP Readings from Last 3 Encounters:  11/23/23 110/64  09/21/23 98/62  07/12/23 (!) 146/83   Immunization History  Administered Date(s) Administered   Fluad Quad(high Dose 65+) 06/12/2019   Influenza Split 07/10/2011   Influenza Whole 07/06/2002, 08/06/2012, 06/20/2013   Influenza, High Dose Seasonal PF 08/09/2017, 07/15/2018, 06/10/2023   Influenza-Unspecified 08/05/2015, 09/10/2020, 08/20/2021, 07/07/2022   PFIZER(Purple Top)SARS-COV-2 Vaccination 10/28/2019, 11/18/2019, 09/10/2020, 07/22/2021, 07/07/2022   Pneumococcal Conjugate-13 10/20/2016   Pneumococcal Polysaccharide-23 10/28/2017   Respiratory Syncytial Virus Vaccine,Recomb Aduvanted(Arexvy) 07/21/2022   Td 05/23/2002   Tdap 08/12/2013, 10/08/2014   Zoster Recombinant(Shingrix) 10/30/2018, 02/19/2019   Zoster, Live 07/06/2012   Health Maintenance Due  Topic Date Due   Medicare Annual Wellness (AWV)  10/30/2019   COVID-19 Vaccine (6 - 2024-25 season) 06/07/2023      Past Medical History:  Diagnosis Date   Erectile dysfunction 11/01/2019   Fracture    L arm @ 10; 3 ribs with shoulder separation & PTX 2003   Gastritis 09/13/2007   EGD   GERD (gastroesophageal reflux disease)    Hepatitis A  1972   from exposure to septic tank which malfunctioned   Internal hemorrhoids 09/13/2007   COLON   Osteoporosis    Paroxysmal atrial fibrillation (HCC)    chads2vasc score is 1   Past Surgical History:  Procedure Laterality Date   ATRIAL FIBRILLATION ABLATION N/A 06/14/2020   Procedure: ATRIAL FIBRILLATION ABLATION;  Surgeon: Hillis Range, MD;  [facility];  Service: Cardiovascular;  Laterality: N/A;   cataract surgery  10/2014   COLONOSCOPY  2011   Dr Jarold Motto   HERNIA REPAIR     SKIN CANCER EXCISION  2011   R calf, Dr Donzetta Starch   UPPER GASTROINTESTINAL ENDOSCOPY  2003 & 2008    H pylori 2008    reports that he quit smoking about 43 years ago. His smoking use included cigarettes. He quit smokeless tobacco use about 43 years ago. He reports current alcohol use of about 3.0 standard drinks of alcohol per week. He reports that he does not use drugs. family history includes Alcohol abuse in his paternal uncle; Colon cancer (age of onset: 13) in his father; Coronary artery disease in his father; Depression in his brother and mother; Emphysema in his father; Tuberculosis in his mother. Allergies  Allergen Reactions   Chlorine     EYES WATER/NASAL CONGESTION   Current Outpatient Medications on File Prior to Visit  Medication Sig Dispense Refill   acetaminophen (TYLENOL) 500  MG tablet Take 500 mg by mouth as needed for moderate pain or headache.      Ascorbic Acid (VITAMIN C) 1000 MG tablet Take 1,000 mg by mouth daily.     Carboxymethylcellulose Sodium (LUBRICANT EYE DROPS OP) Place 1 drop into both eyes 3 (three) times daily.     Cholecalciferol (VITAMIN D-3) 125 MCG (5000 UT) TABS Take 1 tablet by mouth daily. 90 tablet 3   dextromethorphan-guaiFENesin (MUCINEX DM) 30-600 MG 12hr tablet Take 1 tablet by mouth as needed (congestion).      Flaxseed, Linseed, (FLAXSEED OIL) 1000 MG CAPS Take by mouth as directed.      metoprolol succinate (TOPROL XL) 25 MG 24 hr tablet  Take 1 tablet (25 mg total) by mouth daily. 30 tablet 6   triamcinolone (KENALOG) 0.025 % ointment Apply 1 Application topically 2 (two) times daily. Apply small amount twice daily to affected area to help with skin irritation 15 g 0   vitamin E 180 MG (400 UNITS) capsule Take 400 Units by mouth daily.     No current facility-administered medications on file prior to visit.        ROS:  All others reviewed and negative.  Objective        PE:  BP 110/64 (BP Location: Right Arm, Patient Position: Sitting, Cuff Size: Normal)   Pulse 64   Temp 98 F (36.7 C) (Oral)   Ht 6' (1.829 m)   Wt 165 lb (74.8 kg)   SpO2 98%   BMI 22.38 kg/m                 Constitutional: Pt appears in NAD               HENT: Head: NCAT.                Right Ear: External ear normal.                 Left Ear: External ear normal.                Eyes: . Pupils are equal, round, and reactive to light. Conjunctivae and EOM are normal               Nose: without d/c or deformity               Neck: Neck supple. Gross normal ROM               Cardiovascular: Normal rate and regular rhythm.                 Pulmonary/Chest: Effort normal and breath sounds without rales or wheezing.                Abd:  Soft, NT, ND, + BS, no organomegaly               Neurological: Pt is alert. At baseline orientation, motor grossly intact               Skin: Skin is warm. No rashes, no other new lesions, LE edema - none               Psychiatric: Pt behavior is normal without agitation   Micro: none  Cardiac tracings I have personally interpreted today:  none  Pertinent Radiological findings (summarize): none   Lab Results  Component Value Date   WBC 6.8 07/08/2023   HGB 14.9 07/08/2023   HCT 44.1 07/08/2023  PLT 207.0 07/08/2023   GLUCOSE 74 07/08/2023   CHOL 183 11/14/2022   TRIG 58.0 11/14/2022   HDL 66.90 11/14/2022   LDLCALC 105 (H) 11/14/2022   ALT 16 07/08/2023   AST 20 07/08/2023   NA 136 07/08/2023   K  4.1 07/08/2023   CL 104 07/08/2023   CREATININE 0.89 07/08/2023   BUN 14 07/08/2023   CO2 27 07/08/2023   TSH 2.24 11/14/2022   PSA 0.56 11/14/2022   HGBA1C 5.4 11/14/2022   Assessment/Plan:  Michael Lindsey is a 73 y.o. White or Caucasian [1] male with  has a past medical history of Erectile dysfunction (11/01/2019), Fracture, Gastritis (09/13/2007), GERD (gastroesophageal reflux disease), Hepatitis A (1972), Internal hemorrhoids (09/13/2007), Osteoporosis, and Paroxysmal atrial fibrillation (HCC).  Encounter for well adult exam with abnormal findings Age and sex appropriate education and counseling updated with regular exercise and diet Referrals for preventative services - none needed Immunizations addressed - declines covid booster Smoking counseling  - none needed Evidence for depression or other mood disorder - none significant Most recent labs reviewed. I have personally reviewed and have noted: 1) the patient's medical and social history 2) The patient's current medications and supplements 3) The patient's height, weight, and BMI have been recorded in the chart   Osteopenia With near osteoporosis at last DXA - for f/u DXA r/o osteoporosis  Esophageal reflux Sstable overall, for otc antacid prn  Erectile dysfunction Mild to mod persistent, for viagra prn,  to f/u any worsening symptoms or concerns  Followup: Return in about 1 year (around 11/22/2024).  Oliver Barre, MD 11/25/2023 9:46 PM [facility]

## 2023-11-23 NOTE — Patient Instructions (Addendum)
Please consider the Prevnar 20 at the pharmacy  Please continue all other medications as before, and refills have been done if requested.  Please have the pharmacy call with any other refills you may need.  Please continue your efforts at being more active, low cholesterol diet, and weight control.  You are otherwise up to date with prevention measures today.  Please keep your appointments with your specialists as you may have planned  Please schedule the bone density test before leaving today at the scheduling desk (where you check out)  Please go to the LAB at the blood drawing area for the tests to be done  You will be contacted by phone if any changes need to be made immediately.  Otherwise, you will receive a letter about your results with an explanation, but please check with MyChart first.  Please make an Appointment to return for your 1 year visit, or sooner if needed

## 2023-11-25 ENCOUNTER — Encounter: Payer: Self-pay | Admitting: Internal Medicine

## 2023-11-25 NOTE — Assessment & Plan Note (Signed)
With near osteoporosis at last DXA - for f/u DXA r/o osteoporosis

## 2023-11-25 NOTE — Assessment & Plan Note (Signed)

## 2023-11-25 NOTE — Assessment & Plan Note (Signed)
Mild to mod persistent, for viagra prn,  to f/u any worsening symptoms or concerns

## 2023-11-25 NOTE — Assessment & Plan Note (Signed)
[place] overall, for otc antacid prn

## 2023-12-16 ENCOUNTER — Ambulatory Visit
Admission: RE | Admit: 2023-12-16 | Discharge: 2023-12-16 | Disposition: A | Discharge status: Home / Self Care | Payer: Medicare Other | Source: Ambulatory Visit | Attending: Internal Medicine | Admitting: Internal Medicine

## 2023-12-16 DIAGNOSIS — M8580 Other specified disorders of bone density and structure, unspecified site: Secondary | ICD-10-CM | POA: Diagnosis not present

## 2023-12-18 ENCOUNTER — Other Ambulatory Visit: Payer: Self-pay | Admitting: Internal Medicine

## 2023-12-18 ENCOUNTER — Encounter: Payer: Self-pay | Admitting: Internal Medicine

## 2023-12-18 DIAGNOSIS — M810 Age-related osteoporosis without current pathological fracture: Secondary | ICD-10-CM

## 2023-12-22 ENCOUNTER — Other Ambulatory Visit (INDEPENDENT_AMBULATORY_CARE_PROVIDER_SITE_OTHER)

## 2023-12-22 ENCOUNTER — Encounter: Payer: Self-pay | Admitting: Internal Medicine

## 2023-12-22 DIAGNOSIS — Z125 Encounter for screening for malignant neoplasm of prostate: Secondary | ICD-10-CM

## 2023-12-22 DIAGNOSIS — E538 Deficiency of other specified B group vitamins: Secondary | ICD-10-CM

## 2023-12-22 DIAGNOSIS — E785 Hyperlipidemia, unspecified: Secondary | ICD-10-CM

## 2023-12-22 DIAGNOSIS — E559 Vitamin D deficiency, unspecified: Secondary | ICD-10-CM | POA: Diagnosis not present

## 2023-12-22 DIAGNOSIS — R739 Hyperglycemia, unspecified: Secondary | ICD-10-CM

## 2023-12-22 LAB — CBC WITH DIFFERENTIAL/PLATELET
Basophils Absolute: 0 10*3/uL (ref 0.0–0.1)
Basophils Relative: 0.6 % (ref 0.0–3.0)
Eosinophils Absolute: 0.5 10*3/uL (ref 0.0–0.7)
Eosinophils Relative: 7.4 % — ABNORMAL HIGH (ref 0.0–5.0)
HCT: 43.7 % (ref 39.0–52.0)
Hemoglobin: 15 g/dL (ref 13.0–17.0)
Lymphocytes Relative: 18 % (ref 12.0–46.0)
Lymphs Abs: 1.2 10*3/uL (ref 0.7–4.0)
MCHC: 34.3 g/dL (ref 30.0–36.0)
MCV: 96.9 fl (ref 78.0–100.0)
Monocytes Absolute: 0.7 10*3/uL (ref 0.1–1.0)
Monocytes Relative: 10.2 % (ref 3.0–12.0)
Neutro Abs: 4.1 10*3/uL (ref 1.4–7.7)
Neutrophils Relative %: 63.8 % (ref 43.0–77.0)
Platelets: 188 10*3/uL (ref 150.0–400.0)
RBC: 4.51 Mil/uL (ref 4.22–5.81)
RDW: 13.7 % (ref 11.5–15.5)
WBC: 6.5 10*3/uL (ref 4.0–10.5)

## 2023-12-22 LAB — LIPID PANEL
Cholesterol: 158 mg/dL (ref 0–200)
HDL: 55.6 mg/dL (ref >39.00)
LDL Cholesterol: 88 mg/dL (ref 0–99)
NonHDL: 102.7
Total CHOL/HDL Ratio: 3
Triglycerides: 74 mg/dL (ref 0.0–149.0)
VLDL: 14.8 mg/dL (ref 0.0–40.0)

## 2023-12-22 LAB — BASIC METABOLIC PANEL
BUN: 17 mg/dL (ref 6–23)
CO2: 30 meq/L (ref 19–32)
Calcium: 9 mg/dL (ref 8.4–10.5)
Chloride: 104 meq/L (ref 96–112)
Creatinine, Ser: 0.94 mg/dL (ref 0.40–1.50)
GFR: 80.22 mL/min (ref >60.00)
Glucose, Bld: 89 mg/dL (ref 70–99)
Potassium: 5 meq/L (ref 3.5–5.1)
Sodium: 139 meq/L (ref 135–145)

## 2023-12-22 LAB — URINALYSIS, ROUTINE W REFLEX MICROSCOPIC
Bilirubin Urine: NEGATIVE
Hgb urine dipstick: NEGATIVE
Ketones, ur: NEGATIVE
Leukocytes,Ua: NEGATIVE
Nitrite: NEGATIVE
RBC / HPF: NONE SEEN (ref 0–?)
Specific Gravity, Urine: 1.015 (ref 1.000–1.030)
Total Protein, Urine: NEGATIVE
Urine Glucose: NEGATIVE
Urobilinogen, UA: 0.2 (ref 0.0–1.0)
WBC, UA: NONE SEEN (ref 0–?)
pH: 8 (ref 5.0–8.0)

## 2023-12-22 LAB — VITAMIN B12: Vitamin B-12: 479 pg/mL (ref 211–911)

## 2023-12-22 LAB — PSA: PSA: 0.51 ng/mL (ref 0.10–4.00)

## 2023-12-22 LAB — HEPATIC FUNCTION PANEL
ALT: 18 U/L (ref 0–53)
AST: 20 U/L (ref 0–37)
Albumin: 3.9 g/dL (ref 3.5–5.2)
Alkaline Phosphatase: 62 U/L (ref 39–117)
Bilirubin, Direct: 0.1 mg/dL (ref 0.0–0.3)
Total Bilirubin: 0.7 mg/dL (ref 0.2–1.2)
Total Protein: 6.3 g/dL (ref 6.0–8.3)

## 2023-12-22 LAB — HEMOGLOBIN A1C: Hgb A1c MFr Bld: 5.4 % (ref 4.6–6.5)

## 2023-12-22 LAB — VITAMIN D 25 HYDROXY (VIT D DEFICIENCY, FRACTURES): VITD: 59.65 ng/mL (ref 30.00–100.00)

## 2023-12-22 LAB — TSH: TSH: 2.21 u[IU]/mL (ref 0.35–5.50)

## 2023-12-22 NOTE — Progress Notes (Signed)
 The test results show that your current treatment is OK, as the tests are stable.  Please continue the same plan.  There is no other need for change of treatment or further evaluation based on these results, at this time.  thanks

## 2024-01-04 ENCOUNTER — Encounter: Payer: Self-pay | Admitting: Physician Assistant

## 2024-01-04 ENCOUNTER — Ambulatory Visit: Admitting: Physician Assistant

## 2024-01-04 DIAGNOSIS — M810 Age-related osteoporosis without current pathological fracture: Secondary | ICD-10-CM
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
## 2024-01-26 ENCOUNTER — Telehealth: Payer: Self-pay | Admitting: Cardiology

## 2024-01-26 MED ORDER — METOPROLOL SUCCINATE ER 25 MG PO TB24: 25.0000 mg | ORAL_TABLET | Freq: Every day | ORAL | 2 refills | Status: DC

## 2024-01-26 NOTE — Telephone Encounter (Signed)
 Pt's medication was sent to pt's pharmacy as requested. Confirmation received.

## 2024-01-26 NOTE — Telephone Encounter (Signed)
*  STAT* If patient is at the pharmacy, call can be transferred to refill team.   1. Which medications need to be refilled? (please list name of each medication and dose if known) metoprolol  succinate (TOPROL  XL) 25 MG 24 hr tablet    2. Would you like to learn more about the convenience, safety, & potential cost savings by using the [facility]?     3. Are you open to using the [facility] (Type [facility].  ).   4. Which pharmacy/location (including street and city if local pharmacy) is medication to be sent to? [id number] - [place], [place] - [address] AT [facility] OF [address]    5. Do they need a 30 day or 90 day supply? 90 day

## 2024-01-29 ENCOUNTER — Telehealth: Payer: Self-pay | Admitting: Physician Assistant

## 2024-01-29 NOTE — Telephone Encounter (Signed)
 Patient called he would like to know if the medication was ordered for him. His cb# [phone]

## 2024-02-02 ENCOUNTER — Telehealth: Payer: Self-pay | Admitting: Physician Assistant

## 2024-02-02 NOTE — Telephone Encounter (Signed)
 Patient called and said he was waiting on a call back about the osteoporosis management. He never heard anything.  He stated that it was suppose to be a zoom call.  CB#[phone]

## 2024-02-03 ENCOUNTER — Telehealth: Payer: Self-pay

## 2024-02-03 NOTE — Telephone Encounter (Signed)
 Called patient and explained that I had to resend the prescription to the speciality pharmacy  and he will be hearing from them in the next 2-3 days.  He will call when he gets his meds to schedule a nurse visit to teach him how to administer his medication

## 2024-02-18 ENCOUNTER — Other Ambulatory Visit: Payer: Self-pay | Admitting: Physician Assistant

## 2024-02-18 MED ORDER — ADVOCATE INSULIN PEN NEEDLES 31G X 5 MM MISC: 11 refills | Status: AC

## 2024-02-18 MED ORDER — TYMLOS 3120 MCG/1.56ML ~~LOC~~ SOPN: PEN_INJECTOR | SUBCUTANEOUS | 11 refills | Status: AC

## 2024-02-24 ENCOUNTER — Telehealth: Payer: Self-pay | Admitting: Physician Assistant

## 2024-02-24 NOTE — Telephone Encounter (Signed)
 Patient called. He would like a nurse to call him about some medication for his osteoporosis. Cb# [phone]

## 2024-02-25 ENCOUNTER — Telehealth: Payer: Self-pay | Admitting: Physician Assistant

## 2024-02-25 NOTE — Telephone Encounter (Signed)
 Patient called. The pharmacy needs auth for his medication. [phone]

## 2024-03-07 ENCOUNTER — Telehealth: Payer: Self-pay

## 2024-03-07 NOTE — Telephone Encounter (Signed)
 Called patient and no voice mail picked up to inform him that after faxing the4 script to [facility] they did not receive it and I verbally gave the directions for his medication and they will be calling him within the nexr 48 hours or so to discuss delivery instructions

## 2024-04-04 DIAGNOSIS — K080 Exfoliation of teeth due to systemic causes: Secondary | ICD-10-CM | POA: Diagnosis not present

## 2024-04-20 ENCOUNTER — Other Ambulatory Visit: Payer: Self-pay | Admitting: Cardiology

## 2024-04-25 ENCOUNTER — Telehealth: Payer: Self-pay | Admitting: Cardiology

## 2024-04-25 NOTE — Telephone Encounter (Signed)
 *  STAT* If patient is at the pharmacy, call can be transferred to refill team.   1. Which medications need to be refilled? (please list name of each medication and dose if known)   metoprolol  succinate (TOPROL  XL) 25 MG 24 hr tablet   2. Which pharmacy/location (including street and city if local pharmacy) is medication to be sent to?   [id number] - [place], [place] - [address] AT [facility] OF [address]     3. Do they need a 30 day or 90 day supply? 90 days  Patient states that his pharmacy will not give him a 90 day supply even though the prior script was for 90 days. They are only giving him 30 days at a time. He is asking that someone speak to the pharmacy for them to give him a 90 supply. He needs a new refill now. Patient is also asking that our office notify him once it is taken care of.

## 2024-04-26 MED ORDER — METOPROLOL SUCCINATE ER 25 MG PO TB24: 25.0000 mg | ORAL_TABLET | Freq: Every day | ORAL | 1 refills | Status: DC

## 2024-04-26 NOTE — Telephone Encounter (Signed)
 Pt's medication was sent to pt's pharmacy as requested. Confirmation received.

## 2024-06-10 ENCOUNTER — Encounter: Payer: Self-pay | Admitting: Internal Medicine

## 2024-06-20 ENCOUNTER — Telehealth: Payer: Self-pay | Admitting: Radiology

## 2024-06-20 DIAGNOSIS — E291 Testicular hypofunction: Secondary | ICD-10-CM

## 2024-06-20 NOTE — Telephone Encounter (Signed)
 Copied from CRM #[id number]. Topic: Clinical - Request for Lab/Test Order >> Jun 20, 2024  9:21 AM Berwyn MATSU wrote: Reason for CRM: patient called in requesting T[place]one level to be checked. Patient is requesting an update on request.   May you please advise.

## 2024-06-23 NOTE — Addendum Note (Signed)
 Addended by: NORLEEN LYNWOOD ORN on: 06/23/2024 04:50 PM   Modules accepted: Orders

## 2024-06-23 NOTE — Telephone Encounter (Signed)
 Ok lab has been entered to be done at his convenience

## 2024-07-09 ENCOUNTER — Other Ambulatory Visit: Payer: Self-pay | Admitting: Interventional Cardiology

## 2024-07-09 ENCOUNTER — Encounter: Payer: Self-pay | Admitting: Cardiology

## 2024-07-11 ENCOUNTER — Telehealth: Payer: Self-pay | Admitting: Cardiology

## 2024-07-11 NOTE — Telephone Encounter (Signed)
*  STAT* If patient is at the pharmacy, call can be transferred to refill team.   1. Which medications need to be refilled? (please list name of each medication and dose if known) sildenafil  (VIAGRA ) 100 MG tablet   2. Which pharmacy/location (including street and city if local pharmacy) is medication to be sent to?  [id number] - [place], [place] - [address] AT [facility] OF [address]    3. Do they need a 30 day or 90 day supply? 90

## 2024-07-11 NOTE — Telephone Encounter (Signed)
 This RX should come from PCP

## 2024-07-18 DIAGNOSIS — K080 Exfoliation of teeth due to systemic causes: Secondary | ICD-10-CM | POA: Diagnosis not present

## 2024-07-20 DIAGNOSIS — L570 Actinic keratosis: Secondary | ICD-10-CM | POA: Diagnosis not present

## 2024-08-08 ENCOUNTER — Encounter: Payer: Self-pay | Admitting: Radiology

## 2024-09-12 DIAGNOSIS — D225 Melanocytic nevi of trunk: Secondary | ICD-10-CM | POA: Diagnosis not present

## 2024-09-12 DIAGNOSIS — L821 Other seborrheic keratosis: Secondary | ICD-10-CM | POA: Diagnosis not present

## 2024-09-12 DIAGNOSIS — Z85828 Personal history of other malignant neoplasm of skin: Secondary | ICD-10-CM | POA: Diagnosis not present

## 2024-09-23 DIAGNOSIS — K080 Exfoliation of teeth due to systemic causes: Secondary | ICD-10-CM | POA: Diagnosis not present

## 2024-10-26 ENCOUNTER — Telehealth: Payer: Self-pay | Admitting: Cardiology

## 2024-10-26 NOTE — Telephone Encounter (Signed)
" °*  STAT* If patient is at the pharmacy, call can be transferred to refill team.   1. Which medications need to be refilled? (please list name of each medication and dose if known)   metoprolol  succinate (TOPROL  XL) 25 MG 24 hr tablet    2. Which pharmacy/location (including street and city if local pharmacy) is medication to be sent to?  [id number] - [place], [place] - [address] AT [facility] OF [address]    3. Do they need a 30 day or 90 day supply? 90   Patient has appt on 3/10 "

## 2024-10-28 MED ORDER — METOPROLOL SUCCINATE ER 25 MG PO TB24: 25.0000 mg | ORAL_TABLET | Freq: Every day | ORAL | 0 refills | Status: AC

## 2024-10-28 NOTE — Telephone Encounter (Signed)
 Pt calling to follow up on med refill, please advise.

## 2024-10-28 NOTE — Telephone Encounter (Signed)
 Pt's medication was sent to pt's pharmacy as requested. Confirmation received.

## 2024-11-11 ENCOUNTER — Telehealth: Payer: Self-pay | Admitting: Physician Assistant

## 2024-11-11 NOTE — Telephone Encounter (Signed)
 Patient request a call regarding when he's due for his next injection and bone scan.

## 2024-11-23 ENCOUNTER — Encounter: Admitting: Internal Medicine

## 2024-12-09 ENCOUNTER — Encounter: Admitting: Nurse Practitioner

## 2024-12-13 ENCOUNTER — Ambulatory Visit: Admitting: Pulmonary Disease

## 2025-04-13 ENCOUNTER — Encounter: Admitting: Nurse Practitioner
